# Patient Record
Sex: Female | Born: 1975 | Race: White | Hispanic: No | Marital: Married | Smoking: Never smoker
Health system: Southern US, Community
[De-identification: ages and names within clinical notes are randomized; demographics above are authoritative.]

## PROBLEM LIST (undated history)

## (undated) DIAGNOSIS — R011 Cardiac murmur, unspecified: Secondary | ICD-10-CM

## (undated) DIAGNOSIS — Z5189 Encounter for other specified aftercare: Secondary | ICD-10-CM

## (undated) DIAGNOSIS — G43909 Migraine, unspecified, not intractable, without status migrainosus: Secondary | ICD-10-CM

## (undated) DIAGNOSIS — K219 Gastro-esophageal reflux disease without esophagitis: Secondary | ICD-10-CM

## (undated) DIAGNOSIS — F32A Depression, unspecified: Secondary | ICD-10-CM

## (undated) DIAGNOSIS — F419 Anxiety disorder, unspecified: Secondary | ICD-10-CM

## (undated) DIAGNOSIS — N390 Urinary tract infection, site not specified: Secondary | ICD-10-CM

## (undated) DIAGNOSIS — F329 Major depressive disorder, single episode, unspecified: Secondary | ICD-10-CM

## (undated) DIAGNOSIS — R519 Headache, unspecified: Secondary | ICD-10-CM

## (undated) DIAGNOSIS — R51 Headache: Secondary | ICD-10-CM

## (undated) HISTORY — DX: Cardiac murmur, unspecified: R01.1

## (undated) HISTORY — DX: Depression, unspecified: F32.A

## (undated) HISTORY — DX: Migraine, unspecified, not intractable, without status migrainosus: G43.909

## (undated) HISTORY — DX: Anxiety disorder, unspecified: F41.9

## (undated) HISTORY — DX: Headache, unspecified: R51.9

## (undated) HISTORY — DX: Headache: R51

## (undated) HISTORY — DX: Urinary tract infection, site not specified: N39.0

## (undated) HISTORY — DX: Gastro-esophageal reflux disease without esophagitis: K21.9

## (undated) HISTORY — DX: Major depressive disorder, single episode, unspecified: F32.9

## (undated) HISTORY — DX: Encounter for other specified aftercare: Z51.89

---

## 1998-01-22 ENCOUNTER — Other Ambulatory Visit: Admission: RE | Admit: 1998-01-22 | Discharge: 1998-01-22 | Payer: Self-pay | Admitting: Obstetrics and Gynecology

## 1999-01-25 ENCOUNTER — Other Ambulatory Visit: Admission: RE | Admit: 1999-01-25 | Discharge: 1999-01-25 | Payer: Self-pay | Admitting: *Deleted

## 2000-03-10 ENCOUNTER — Other Ambulatory Visit: Admission: RE | Admit: 2000-03-10 | Discharge: 2000-03-10 | Payer: Self-pay | Admitting: *Deleted

## 2001-03-29 ENCOUNTER — Other Ambulatory Visit: Admission: RE | Admit: 2001-03-29 | Discharge: 2001-03-29 | Payer: Self-pay | Admitting: *Deleted

## 2002-04-12 ENCOUNTER — Other Ambulatory Visit: Admission: RE | Admit: 2002-04-12 | Discharge: 2002-04-12 | Payer: Self-pay | Admitting: *Deleted

## 2003-04-18 ENCOUNTER — Other Ambulatory Visit: Admission: RE | Admit: 2003-04-18 | Discharge: 2003-04-18 | Payer: Self-pay | Admitting: *Deleted

## 2007-06-21 HISTORY — PX: ABDOMINAL HYSTERECTOMY: SHX81

## 2014-04-10 ENCOUNTER — Ambulatory Visit: Payer: Self-pay | Admitting: Internal Medicine

## 2014-05-05 ENCOUNTER — Ambulatory Visit: Payer: Self-pay | Admitting: Internal Medicine

## 2014-06-09 ENCOUNTER — Ambulatory Visit: Payer: Self-pay | Admitting: Internal Medicine

## 2014-06-19 ENCOUNTER — Other Ambulatory Visit (INDEPENDENT_AMBULATORY_CARE_PROVIDER_SITE_OTHER): Payer: BC Managed Care – PPO

## 2014-06-19 ENCOUNTER — Ambulatory Visit (INDEPENDENT_AMBULATORY_CARE_PROVIDER_SITE_OTHER): Payer: BC Managed Care – PPO | Admitting: Internal Medicine

## 2014-06-19 ENCOUNTER — Encounter: Payer: Self-pay | Admitting: Internal Medicine

## 2014-06-19 VITALS — BP 116/76 | HR 84 | Temp 98.2°F | Resp 16 | Ht 64.0 in | Wt 189.0 lb

## 2014-06-19 DIAGNOSIS — F32A Depression, unspecified: Secondary | ICD-10-CM

## 2014-06-19 DIAGNOSIS — R635 Abnormal weight gain: Secondary | ICD-10-CM | POA: Insufficient documentation

## 2014-06-19 DIAGNOSIS — G4700 Insomnia, unspecified: Secondary | ICD-10-CM

## 2014-06-19 DIAGNOSIS — Z0000 Encounter for general adult medical examination without abnormal findings: Secondary | ICD-10-CM | POA: Insufficient documentation

## 2014-06-19 DIAGNOSIS — R799 Abnormal finding of blood chemistry, unspecified: Secondary | ICD-10-CM

## 2014-06-19 DIAGNOSIS — F329 Major depressive disorder, single episode, unspecified: Secondary | ICD-10-CM

## 2014-06-19 LAB — BASIC METABOLIC PANEL
BUN: 16 mg/dL (ref 6–23)
CO2: 27 mEq/L (ref 19–32)
CREATININE: 0.8 mg/dL (ref 0.4–1.2)
Calcium: 9.8 mg/dL (ref 8.4–10.5)
Chloride: 105 mEq/L (ref 96–112)
GFR: 84.63 mL/min (ref >60.00)
GLUCOSE: 86 mg/dL (ref 70–99)
POTASSIUM: 4.9 meq/L (ref 3.5–5.1)
Sodium: 136 mEq/L (ref 135–145)

## 2014-06-19 LAB — LIPID PANEL
CHOL/HDL RATIO: 7
CHOLESTEROL: 224 mg/dL — AB (ref 0–200)
HDL: 34.3 mg/dL — ABNORMAL LOW (ref >39.00)
NonHDL: 189.7
Triglycerides: 202 mg/dL — ABNORMAL HIGH (ref 0.0–149.0)
VLDL: 40.4 mg/dL — ABNORMAL HIGH (ref 0.0–40.0)

## 2014-06-19 LAB — TSH: TSH: 1.15 u[IU]/mL (ref 0.35–4.50)

## 2014-06-19 LAB — T4, FREE: FREE T4: 0.7 ng/dL (ref 0.60–1.60)

## 2014-06-19 MED ORDER — SUVOREXANT 5 MG PO TABS: 5.0000 mg | ORAL_TABLET | Freq: Every evening | ORAL | Status: DC | PRN

## 2014-06-19 NOTE — Assessment & Plan Note (Signed)
Doing well with citalopram and given recommendations for therapist.

## 2014-06-19 NOTE — Assessment & Plan Note (Signed)
She has problems with thoughts on her mind at night time and will trial belsomra as even tylenol PM OTC has made her feel groggy the next day and she still has young child (5).

## 2014-06-19 NOTE — Patient Instructions (Signed)
We will have you try a medicine called belsomra for sleep. It helps to turn off the awake system which is keeping you thinking about things and alert and lets the sleep system take over. We have given you a savings card in case your insurance does not cover it.  We will check on your thyroid, cholesterol, kidneys.   We will see you back next year for your physical if things are going well, if you have any problems with your medicines or sleep please feel free to call us back and come back sooner.   Exercise to Lose Weight Exercise and a healthy diet may help you lose weight. Your doctor may suggest specific exercises. EXERCISE IDEAS AND TIPS  Choose low-cost things you enjoy doing, such as walking, bicycling, or exercising to workout videos.  Take stairs instead of the elevator.  Walk during your lunch break.  Park your car further away from work or school.  Go to a gym or an exercise class.  Start with 5 to 10 minutes of exercise each day. Build up to 30 minutes of exercise 4 to 6 days a week.  Wear shoes with good support and comfortable clothes.  Stretch before and after working out.  Work out until you breathe harder and your heart beats faster.  Drink extra water when you exercise.  Do not do so much that you hurt yourself, feel dizzy, or get very short of breath. Exercises that burn about 150 calories:  Running 1  miles in 15 minutes.  Playing volleyball for 45 to 60 minutes.  Washing and waxing a car for 45 to 60 minutes.  Playing touch football for 45 minutes.  Walking 1  miles in 35 minutes.  Pushing a stroller 1  miles in 30 minutes.  Playing basketball for 30 minutes.  Raking leaves for 30 minutes.  Bicycling 5 miles in 30 minutes.  Walking 2 miles in 30 minutes.  Dancing for 30 minutes.  Shoveling snow for 15 minutes.  Swimming laps for 20 minutes.  Walking up stairs for 15 minutes.  Bicycling 4 miles in 15 minutes.  Gardening for 30 to 45  minutes.  Jumping rope for 15 minutes.  Washing windows or floors for 45 to 60 minutes. Document Released: 07/09/2010 Document Revised: 08/29/2011 Document Reviewed: 07/09/2010 [facility] Patient Information 2015 [place], [place]. This information is not intended to replace advice given to you by your health care provider. Make sure you discuss any questions you have with your health care provider. Serving Sizes What we call a serving size today is larger than it was in the past. A 1950s fast-food burger contained little more than 1 oz of meat, and a soft drink was 8 oz (1 cup). Today, a "quarter pounder" burger is at least 4 times that amount, and a 32 or 64 oz drink is not uncommon. A possible guide for eating when trying to lose weight is to eat about half as much as you normally do. Some estimates of serving sizes are:  1 Dairy serving:Individual container of yogurt (8 oz) or piece of cheese the size of your thumb (1 oz).  1 Grain serving: 1 slice of bread or  cup pasta.  1 Meat serving: The size of a deck of cards (3 oz).  1 Fruit serving: cup canned fruit or 1 medium fruit.  1 Vegetable serving:  cup of cooked or canned vegetables.  1 Fat serving:The size of 4 stacked dimes. Experts suggest spending 1 or 2  days measuring food portions you commonly eat. This will give you better practice at estimating serving sizes, and will also show whether you are eating an appropriate amount of food to meet your weight goals. If you find that you are eating more than you thought, try measuring your food for a few days so you can "reprogram" yourself to learn what makes a healthy portion for you. SUGGESTIONS FOR CONTROL  In restaurants, share entrees, or ask the waiter to put half the entre in a box or bag before you even touch it.  Order lunch-sized portions. Many restaurants serve 4 to 6 oz of meat at lunch, compared with 8 to 10 oz at dinner.  Split dessert or skip it all together. Have a  piece of fruit when you get home.  At home, use smaller plates and bowls. It will look as if you are eating more.  Plate your food in the kitchen rather than serving it "family style" at the table.  Wait 20 to 30 minutes before taking seconds. This is how long it takes your brain to recognize that you are full.  Check food labels for serving sizes. Eat 1 serving only.  Use measuring cups and spoons to see proper serving sizes.  Buy smaller packages of candy, popcorn, and snacks.  Avoid eating directly out of the bag or carton.  While eating half as much, exercise twice as much. Park further away from the mall, take the stairs instead of the escalator, and walk around your block. Losing weight is a slow, difficult process. It takes long-lasting lifestyle changes. You can make gradual changes over time so they become habits. Look to friends and family to support the healthy changes you are making. Avoid fad diets since they are often only temporary weight loss solutions. Document Released: 03/05/2003 Document Revised: 08/29/2011 Document Reviewed: 09/03/2013 [facility] Patient Information 2015 [place], [place]. This information is not intended to replace advice given to you by your health care provider. Make sure you discuss any questions you have with your health care provider.

## 2014-06-19 NOTE — Progress Notes (Signed)
Pre visit review using our clinic review tool, if applicable. No additional management support is needed unless otherwise documented below in the visit note. 

## 2014-06-19 NOTE — Progress Notes (Signed)
   Subjective:    Patient ID: Yolanda Wolfe, female    DOB: Nov 17, 1975, 38 y.o.   MRN: [id number]  HPI The patient is a 38 YO female who comes in today for evaluation. She is having weight gain and wants to lose some weight. She had an emergent hysterectomy after the birth of her first child due to massive bleeding. She also required bilateral fasciotomy on his shins for increased pressure and has dysfunction tendons on both great toes. She also has thyroid history in her family. No current chest pains, SOB, abdominal pains, gerd. She is taking citalopram which she thinks is doing very well. She would like to know if I can recommend a good therapist in the area as she was doing some counseling previously. She recently moved from [place].   Review of Systems  Constitutional: Positive for unexpected weight change. Negative for fever, activity change, appetite change and fatigue.  HENT: Negative.   Respiratory: Negative for cough, chest tightness, shortness of breath and wheezing.   Cardiovascular: Negative for chest pain, palpitations and leg swelling.  Gastrointestinal: Negative for abdominal pain, diarrhea, constipation and abdominal distention.  Genitourinary: Negative.   Musculoskeletal: Negative for back pain, arthralgias and gait problem.  Skin: Negative.   Neurological: Negative for dizziness, weakness, light-headedness, numbness and headaches.  Psychiatric/Behavioral: Negative.       Objective:   Physical Exam  Constitutional: She is oriented to person, place, and time. She appears well-developed and well-nourished.  HENT:  Head: Normocephalic and atraumatic.  Eyes: EOM are normal.  Neck: Normal range of motion. No thyromegaly present.  Cardiovascular: Normal rate and regular rhythm.   Pulmonary/Chest: Effort normal and breath sounds normal. No respiratory distress. She has no wheezes. She has no rales.  Abdominal: Soft. Bowel sounds are normal. She exhibits no distension. There is no  tenderness. There is no rebound.  Musculoskeletal: She exhibits no edema.  Neurological: She is alert and oriented to person, place, and time. Coordination normal.  Skin: Skin is warm and dry.  Psychiatric: She has a normal mood and affect. Her behavior is normal.   Filed Vitals:   06/19/14 1119  BP: 116/76  Pulse: 84  Temp: 98.2 F (36.8 C)  TempSrc: Oral  Resp: 16  Height: 5\' 4"  (1.626 m)  Weight: 189 lb (85.73 kg)  SpO2: 96%      Assessment & Plan:

## 2014-06-19 NOTE — Assessment & Plan Note (Signed)
Given her traumatic hysterectomy with blood loss is reasonable to check for thyroid dysfunction with TSH and free t4. Will also check lipid, cmp,

## 2014-06-19 NOTE — Assessment & Plan Note (Signed)
PAP prior to hysterectomy normal, flu shot already this season and up to date on tetanus. Had mammogram normal this year.

## 2014-06-23 LAB — LDL CHOLESTEROL, DIRECT: LDL DIRECT: 154.8 mg/dL

## 2014-06-25 ENCOUNTER — Encounter: Payer: Self-pay | Admitting: Internal Medicine

## 2014-07-08 ENCOUNTER — Ambulatory Visit (INDEPENDENT_AMBULATORY_CARE_PROVIDER_SITE_OTHER): Payer: BC Managed Care – PPO | Admitting: Licensed Clinical Social Worker

## 2014-07-08 DIAGNOSIS — F4323 Adjustment disorder with mixed anxiety and depressed mood: Secondary | ICD-10-CM

## 2014-07-09 ENCOUNTER — Ambulatory Visit: Payer: BC Managed Care – PPO | Admitting: Licensed Clinical Social Worker

## 2014-07-17 ENCOUNTER — Ambulatory Visit: Payer: BC Managed Care – PPO | Admitting: Licensed Clinical Social Worker

## 2014-09-26 ENCOUNTER — Ambulatory Visit (INDEPENDENT_AMBULATORY_CARE_PROVIDER_SITE_OTHER): Payer: BC Managed Care – PPO | Admitting: Psychiatry

## 2014-09-26 ENCOUNTER — Encounter (INDEPENDENT_AMBULATORY_CARE_PROVIDER_SITE_OTHER): Payer: Self-pay

## 2014-09-26 DIAGNOSIS — F4323 Adjustment disorder with mixed anxiety and depressed mood: Secondary | ICD-10-CM

## 2014-09-26 DIAGNOSIS — F4321 Adjustment disorder with depressed mood: Secondary | ICD-10-CM

## 2014-09-26 NOTE — Progress Notes (Signed)
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 They've had ongoing marital issue for probably close to a year. Both of them believe separation is inevitable and divorce is coming. They do have a 3-year-old daughter who seems to be handling things fairly well. The patient has a past psychiatric history where in 2009 after hysterectomy she developed depression and was treated with Celexa and therapy. She did well but continued on the Celexa. Again 4 years ago in 2012 she again had a period of depression. During both of these episodes where she was depressed she actually did not have vegetative symptoms of significance and technically did not really have a clear episode of major depression. Even now she describes daily depression but has chronic unchanging sleep normal appetite and normal energy. Her concentration is good she is a good sense of worth she is not suicidal now nor she ever been. She enjoys life to a great degree reading watching television and caring for her animals. 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 The patient presently is in graduate school . This patient has no history of being in a psychiatric hospital. She has no other medication she's been on other than her Celexa. The patient denies the use of alcohol or drugs. She's never been psychotic. She denies symptoms of mania. She denies any specific symptoms typical of an anxiety disorder. Even at this time she does not feel all that anxious. In essence the patient believes that the psychotherapy in the past has been helpful and now her lyses she needs therapy again. History of Chief Complaint:  No chief complaint on  file.   HPI Review of Systems Physical Exam  Depressive Symptoms: depressed mood,  (Hypo) Manic Symptoms:   Elevated Mood:  No Irritable Mood:  No Grandiosity:  No Distractibility:  No Labiality of Mood:  No Delusions:  No Hallucinations:  No Impulsivity:  No Sexually Inappropriate Behavior:  No Financial Extravagance:  No Flight of Ideas:  No  Anxiety Symptoms: Excessive Worry:  No Panic Symptoms:  No Agoraphobia:  No Obsessive Compulsive: No  Symptoms:  Specific Phobias:   Social Anxiety:  No  Psychotic Symptoms:  Hallucinations: No  Delusions:  No  Paranoia:    none Ideas of Reference:    PTSD Symptoms: Ever had a traumatic exposure:   Had a traumatic exposure in the last month:   Re-experiencing:   Hypervigilance:   Hyperarousal:   Avoidance:  None  Traumatic Brain Injury: No   Past Psychiatric History: Diagnosis:Major depression  Hospitalizations: o  Outpatient Care: therapy  Substance Abuse Care: no  Self-Mutilation:   Suicidal Attempts:   Violent Behaviors:   Past Medical History:   Past Medical History  Diagnosis Date  . Depression   . Frequent headaches   . GERD (gastroesophageal reflux disease)   . Heart murmur   . Blood transfusion without reported diagnosis   . Migraine   . UTI (lower urinary tract infection)    History of Loss of Consciousness:   Seizure History:   Cardiac History:   Allergies:  No Known Allergies Current Medications:  Current Outpatient Prescriptions  Medication Sig Dispense Refill  . citalopram (CELEXA) 40 MG  tablet Take 40 mg by mouth daily.     . Suvorexant (BELSOMRA) 5 MG TABS Take 5 mg by mouth at bedtime as needed. 30 tablet 3   No current facility-administered medications for this visit.    Previous Psychotropic Medications:  Medication Dose   Celexa 40 mg                          Medical Consequences of Substance Abuse:   Legal Consequences of Substance Abuse:   Family Consequences  of Substance Abuse:   Blackouts:   DT's:   Withdrawal Symptoms:     Social History: Current Place of Residence: [place], sport and exercise of Birth:  Family Members:  Marital Status:  Separated Children: 1  Sons:   Daughters:  Relationships:  Education:  Dentist Problems/Performance:  Religious Beliefs/Practices:  History of Abuse:  Ship broker History:   Legal History:  Hobbies/Interests:   Family History:   Family History  Problem Relation Age of Onset  . Cancer Maternal Grandmother     breast  . Heart disease Maternal Grandfather     Mental Status Examination/Evaluation: Objective:  Appearance: Fairly Groomed  Engineer, water::  Good  Speech:  Clear and Coherent  Volume:  Normal  Mood:  good  Affect:  Congruent  Thought Process:  Coherent  Orientation:  Full (Time, Place, and Person)  Thought Content:  WDL  Suicidal Thoughts:  No  Homicidal Thoughts:  No  Judgement:  NA  Insight:  Good  Psychomotor Activity:  Normal  Akathisia:  No  Handed:  Right  AIMS (if indicated):    Assets:  Desire for Improvement    Laboratory/X-Ray Psychological Evaluation(s)        Assessment:  Axis I: Adjustment Disorder with Depressed Mood  AXIS I Adjustment Disorder with Depressed Mood  AXIS II Deferred  AXIS III Past Medical History  Diagnosis Date  . Depression   . Frequent headaches   . GERD (gastroesophageal reflux disease)   . Heart murmur   . Blood transfusion without reported diagnosis   . Migraine   . UTI (lower urinary tract infection)      AXIS IV problems with primary support group  AXIS V    Treatment Plan/Recommendations:  Plan of Care: This patient will continue taking Celexa 40 mg. Her most important intervention however will be to refer her to therapist. Burnis Medin refer her to Katina Degree and Dr. Sharion Settler. The patient was also given a referral to Mr. Lowry Bowl as a potential divorce lawyer. His patient to return to see me  in   Laboratory:    Psychotherapy:   Medications:   Routine PRN Medications:    Consultations:   Safety Concerns:    Other:      Haskel Schroeder, MD 4/8/20169:15 AM

## 2014-12-31 ENCOUNTER — Encounter (HOSPITAL_COMMUNITY): Payer: Self-pay | Admitting: Psychiatry

## 2014-12-31 ENCOUNTER — Ambulatory Visit (INDEPENDENT_AMBULATORY_CARE_PROVIDER_SITE_OTHER): Payer: Self-pay | Admitting: Psychiatry

## 2014-12-31 VITALS — BP 106/76 | HR 78 | Ht 64.0 in | Wt 184.2 lb

## 2014-12-31 DIAGNOSIS — F4323 Adjustment disorder with mixed anxiety and depressed mood: Secondary | ICD-10-CM

## 2014-12-31 DIAGNOSIS — F330 Major depressive disorder, recurrent, mild: Secondary | ICD-10-CM

## 2014-12-31 MED ORDER — SERTRALINE HCL 100 MG PO TABS: 100.0000 mg | ORAL_TABLET | Freq: Every day | ORAL | Status: DC

## 2014-12-31 NOTE — Progress Notes (Signed)
[facility] MD Progress Note  12/31/2014 2:22 PM Yolanda Wolfe  MRN:  [id number] Subjective: Better Principal Problem: Adjustment disorder with depressed mood Diagnosis:  Major depression mild recurrent Patient Active Problem List   Diagnosis Date Noted  . Weight gain [R63.5] 06/19/2014  . Depression [F32.9] 06/19/2014  . Routine general medical examination at a health care facility [Z00.00] 06/19/2014  . Insomnia [G47.00] 06/19/2014   Total Time spent with patient: 30 minutes   Past Medical History:  Past Medical History  Diagnosis Date  . Depression   . Frequent headaches   . GERD (gastroesophageal reflux disease)   . Heart murmur   . Blood transfusion without reported diagnosis   . Migraine   . UTI (lower urinary tract infection)     Past Surgical History  Procedure Laterality Date  . Abdominal hysterectomy  2009   Family History:  Family History  Problem Relation Age of Onset  . Cancer Maternal Grandmother     breast  . Heart disease Maternal Grandfather   . Alcohol abuse Paternal Grandfather   . Dementia Paternal Grandmother    Social History:  History  Alcohol Use  . 0.6 oz/week  . 0 Standard drinks or equivalent, 1 Glasses of wine per week     History  Drug Use No    History   Social History  . Marital Status: Married    Spouse Name: N/A  . Number of Children: N/A  . Years of Education: N/A   Social History Main Topics  . Smoking status: Never Smoker   . Smokeless tobacco: Never Used  . Alcohol Use: 0.6 oz/week    0 Standard drinks or equivalent, 1 Glasses of wine per week  . Drug Use: No  . Sexual Activity: Yes    Birth Control/ Protection: Surgical   Other Topics Concern  . None   Social History Narrative   Additional History:    Sleep: Poor  Appetite:  Good   Assessment: Today the patient says that overall she is improved. When we first met she was having persistent daily depression and now she is only depressed about 40-50% of the week.  When she does feel depressed it is very intense although she's never suicidal. Overall that her mood has improved even though we did not change her Celexa. Will he did do his we got her to see a good therapist that she seeing now. She's going onto her fifth visit and overall it likely is helpful. She has minimal contact with her husband who she separated from. The patient continues to start making new relationships with people and this feels good to her. Overall she continues asleep poorly but this been the case for much of her life. Her appetite good she has good energy and she can concentrate without problems. The other stressor that did occur is that her contract was not renewed. Therefore she is unemployed and looking for work at this time. 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 The patient's daughter is doing very well. The patient continues to enjoy riding horses spending time with her dogs or cats and listening to music. The patient denies the use of alcohol or drugs. The patient denies chest discomfort shortness of breath or any neurological symptoms at this time. Medically she is very stable. Today reviewed the pros and cons of her medication and she agreed to take them as prescribed.  Musculoskeletal: Strength & Muscle Tone: within normal limits Gait & Station:  normal Patient leans: Right   Psychiatric Specialty Exam: Physical Exam  ROS  Blood pressure 106/76, pulse 78, height _0  (1.626 m), weight 184 lb 3.2 oz (83.553 kg).Body mass index is 31.6 kg/(m^2).  General Appearance: Casual  Eye Contact::  Good  Speech:  Clear and Coherent  Volume:  Normal  Mood:  Euthymic  Affect:  Appropriate  Thought Process:  Coherent  Orientation:  NA  Thought Content:  WDL  Suicidal Thoughts:  No  Homicidal Thoughts:  No  Memory:  NA  Judgement:  Good  Insight:  Good  Psychomotor Activity:  Normal  Concentration:  Good  Recall:  Good  Fund of Knowledge:Good   Language: Good  Akathisia:  No  Handed:  Right  AIMS (if indicated):     Assets:  Communication Skills  ADL's:  Intact  Cognition: WNL  Sleep:        Current Medications: Current Outpatient Prescriptions  Medication Sig Dispense Refill  . citalopram (CELEXA) 40 MG tablet Take 40 mg by mouth daily.     . sertraline (ZOLOFT) 100 MG tablet Take 1 tablet (100 mg total) by mouth daily. 30 tablet 5  . Suvorexant (BELSOMRA) 5 MG TABS Take 5 mg by mouth at bedtime as needed. (Patient not taking: Reported on 12/31/2014) 30 tablet 3   No current facility-administered medications for this visit.    Lab Results: No results found for this or any previous visit (from the past 48 hour(s)).  Physical Findings: AIMS:  , ,  ,  ,    CIWA:    COWS:     Treatment Plan Summary: At this time we shall actually change her Celexa to Zoloft. I shared with her that she can just switch over stopping the Celexa and starting with 50 mg of Zoloft for a week and then increasing the Zoloft 100 mg. The patient feels like she once to do something different with her antidepressive. She's been on it for long time. The possibility of raising Zoloft is possible in contrast to Celexa which is at its maximum dose. The other issue besides depression is her second problem of chronic poor sleep. At this time her therapist would like to work on some things to help her to do some behavioral approaches. Today our approach for sleep was to give her sleep hygiene exercises and recommendations. So first problem is that of depression which is clearly improved with the use of therapy and time. Her second problem is sleep are now she has some instructions of improved sleep hygiene and ultimately we will consider the use of some sleeping aids. I think a good night's sleep is important for depression. The patient has tried a number of the over-the-counter agents which has not been helpful. This patient to return to see me in 3  months.   Medical Decision Making:  Self-Limited or Minor (1)     Aneya Daddona IRVING 12/31/2014, 2:22 PM

## 2015-04-03 ENCOUNTER — Ambulatory Visit (HOSPITAL_COMMUNITY): Payer: Self-pay | Admitting: Psychiatry

## 2015-04-03 ENCOUNTER — Ambulatory Visit (INDEPENDENT_AMBULATORY_CARE_PROVIDER_SITE_OTHER): Payer: Self-pay | Admitting: Psychiatry

## 2015-04-03 VITALS — BP 122/82 | HR 75 | Ht 64.0 in | Wt 187.0 lb

## 2015-04-03 DIAGNOSIS — F331 Major depressive disorder, recurrent, moderate: Secondary | ICD-10-CM

## 2015-04-03 MED ORDER — SERTRALINE HCL 100 MG PO TABS: 100.0000 mg | ORAL_TABLET | Freq: Every day | ORAL | Status: DC

## 2015-04-03 NOTE — Progress Notes (Signed)
[facility] MD Progress Note  04/03/2015 9:17 AM Yolanda Wolfe  MRN:  [id number] Subjective:  Feeling good Principal Problem: Major depression recurrent, remission Diagnosis:  Major depression recurrent, recurrent Today the patient says she feels just about back to herself. She denies daily depression. Since I've seen her she is now started in a new relationship that seems better than her previous boyfriend. She continues to look for work. Patient is getting a PhD in education and is looking for a job working at [employer] setting. She has applications out to a variety of places. Her daughter who 30 years old is doing great. She is active and energetic intelligent and a good kid. The patient has a reasonably good relationship also with her mother. Today the patient denies daily depression. She sleeping and eating well. She has improved sleep hygiene. She's good energy. The patient enjoys walking her dog reading and watching TV. Unfortunately she did not have a good chemistry with the therapist that she was sent to. She is interested in therapy and she's agreed that she liked to have somebody to start with. The patient denies the use of alcohol or drugs. She denies being psychotic. The patient is functioning very well. She denies any physical complaints at this time. Patient Active Problem List   Diagnosis Date Noted  . Weight gain [R63.5] 06/19/2014  . Depression [F32.9] 06/19/2014  . Routine general medical examination at a health care facility [Z00.00] 06/19/2014  . Insomnia [G47.00] 06/19/2014   Total Time spent with patient: 30 minutes  Past Psychiatric History:   Past Medical History:  Past Medical History  Diagnosis Date  . Depression   . Frequent headaches   . GERD (gastroesophageal reflux disease)   . Heart murmur   . Blood transfusion without reported diagnosis   . Migraine   . UTI (lower urinary tract infection)     Past Surgical History  Procedure Laterality Date  . Abdominal  hysterectomy  2009   Family History:  Family History  Problem Relation Age of Onset  . Cancer Maternal Grandmother     breast  . Heart disease Maternal Grandfather   . Alcohol abuse Paternal Grandfather   . Dementia Paternal Grandmother    Family Psychiatric  History:  Social History:  History  Alcohol Use  . 0.6 oz/week  . 0 Standard drinks or equivalent, 1 Glasses of wine per week     History  Drug Use No    Social History   Social History  . Marital Status: Married    Spouse Name: N/A  . Number of Children: N/A  . Years of Education: N/A   Social History Main Topics  . Smoking status: Never Smoker   . Smokeless tobacco: Never Used  . Alcohol Use: 0.6 oz/week    0 Standard drinks or equivalent, 1 Glasses of wine per week  . Drug Use: No  . Sexual Activity: Yes    Birth Control/ Protection: Surgical   Other Topics Concern  . Not on file   Social History Narrative   Additional Social History:                         Sleep: Good  Appetite:  Good  Current Medications: Current Outpatient Prescriptions  Medication Sig Dispense Refill  . citalopram (CELEXA) 40 MG tablet Take 40 mg by mouth daily.     . sertraline (ZOLOFT) 100 MG tablet Take 1 tablet (100 mg total) by mouth  daily. 30 tablet 5  . Suvorexant (BELSOMRA) 5 MG TABS Take 5 mg by mouth at bedtime as needed. (Patient not taking: Reported on 12/31/2014) 30 tablet 3   No current facility-administered medications for this visit.    Lab Results: No results found for this or any previous visit (from the past 48 hour(s)).  Physical Findings: AIMS:  , ,  ,  ,    CIWA:    COWS:     Musculoskeletal: Strength & Muscle Tone: within normal limits Gait & Station: normal Patient leans: Right  Psychiatric Specialty Exam: ROS  Blood pressure 122/82, pulse 75, height 5\' 4"  (1.626 m), weight 187 lb (84.823 kg).Body mass index is 32.08 kg/(m^2).  General Appearance: Casual  Eye Contact::  Good   Speech:  Clear and Coherent  Volume:  Normal  Mood:  Euthymic  Affect:  NA  Thought Process:  Coherent  Orientation:  Full (Time, Place, and Person)  Thought Content:  WDL  Suicidal Thoughts:  No  Homicidal Thoughts:  No  Memory:  NA  Judgement:  Good  Insight:  Good  Psychomotor Activity:  Normal  Concentration:  Good  Recall:  Good  Fund of Knowledge:Good  Language: Good  Akathisia:  No  Handed:  Right  AIMS (if indicated):     Assets:  Desire for Improvement  ADL's:  Intact  Cognition: WNL  Sleep:      Treatment Plan Summary: At this time the patient is doing very well. Her mood is back to normal. The patient is sleeping and eating well. She has no vegetative symptoms. He is actively engaged looking for a job and presently is in a good relationship. The patient is interested in supportive psychotherapy. The patient is articulate and psychologically sophisticated. At this time she is not suicidal. She is very stable. She is functioning well. She changed from Celexa to  100 mg of Zoloft without any side effects. Her sex drive is good and she has no GI problems. At this time we'll go ahead and set her up for a therapist in our center and she'll get a return appointment to see me in 5 months. The patient was instructed that if anything changes to call his right away but I believe this patient is very stable at this time. Remeron problem therefore is clinical depression and she's treated with a better antidepressant that of Zoloft together with starting psychotherapy. Her second problem is problems with sleep with the use of sleep hygiene she seems to be doing well.  Yolanda Wolfe, [place] 04/03/2015, 9:17 AM

## 2015-05-05 ENCOUNTER — Ambulatory Visit (HOSPITAL_COMMUNITY): Payer: Self-pay | Admitting: Psychology

## 2015-05-07 ENCOUNTER — Ambulatory Visit (INDEPENDENT_AMBULATORY_CARE_PROVIDER_SITE_OTHER): Payer: BC Managed Care – PPO | Admitting: Psychology

## 2015-05-07 ENCOUNTER — Encounter (HOSPITAL_COMMUNITY): Payer: Self-pay | Admitting: Psychology

## 2015-05-07 DIAGNOSIS — F331 Major depressive disorder, recurrent, moderate: Secondary | ICD-10-CM

## 2015-05-07 DIAGNOSIS — F339 Major depressive disorder, recurrent, unspecified: Secondary | ICD-10-CM | POA: Insufficient documentation

## 2015-05-08 NOTE — Progress Notes (Signed)
Comprehensive Clinical Assessment (CCA) Note  05/08/2015 Yolanda Wolfe [id number]  CCA Part One  Part One has been completed on paper by the patient.  (See scanned document in Chart Review)  CCA Part Two A  Intake/Chief Complaint:  CCA Intake With Chief Complaint CCA Part Two Date: 05/07/15 CCA Part Two Time: 1007 Chief Complaint/Presenting Problem: Pt presents for counseling for ongoing depression. Pt has been tx by Dr. Casimiro Wolfe for MDD since April 2016.  Pt first experienced depression in 2009 following the birth of her daughter.  Pt had a normal pregnancy, difficult deliver, unable to deliver vaginally after pushing couple hours, had a csection, then severe bleeding that wouldn't stop and an emergency hysterectomy.   Pt also had emergency surger on her legs due to another complication.  Pt was in the hospital 2 weeks after delivery , then moved in with her parents several months so they could assist her w/ care of her daugher and her recovery.  pt had another episode of severe depression / "super dark place" 3 years ago feeling need to get out of her marriage.  Pt had counselor she connected w/ when living in [place] and has attemped couple counselors in the area but hasn't found a good fit.  (Pt presents for counseling for ongoing depression. Pt has been tx by Dr. Casimiro Wolfe for MDD since April 2016.   Patients Currently Reported Symptoms/Problems: pt reports she still struggles w/ feeling of worthlessness and bad about self- "should have a job by now".  Pt reports she falls asleep well, but wakes throughout the night/restless sleep, difficulty getting up on days doesn't have daughter and feels fatigued throughout the day.  pt reports she doesn't feel  satisfied and feels like she is "waiting".  Pt reports she is a chronic procrastinator which does cause stress.  pt reports little motivation towards accomplishing things on a daily basis.  pt reports she has started horse back riding again and this has  been positive.  pt does report worry about future, job, completing dissertation (pt reports she still struggles w/ feeling of worthlessness and bad about self- "should have a job by now".  Pt reports she falls asleep well, but wakes throughout the night/restless sleep, difficulty getting up on days doesn't have daughter and feels fat) Collateral Involvement: Dr. Karen Wolfe notes (Dr. Karen Wolfe notes) Individual's Strengths: Support of parents, brother, friends, Yolanda Wolfe/exhusband, and her dog minnie.  She is smart a good Ship broker, funny and fun to be with, loving and a Media planner.  she enjoys horseback riding, traveling and is good w/ animals.  (Support of parents, brother, friends, Yolanda Wolfe/exhusband, and her dog minnie.  She is smart a good Ship broker, funny and fun to be with, loving and a Media planner.  she enjoys horseback riding, traveling and is good w/ animals. ) Individual's Preferences: counseling to assist w/ keeping accountable and plan for strategies for self care. (counseling to assist w/ keeping accountable and plan for strategies for self care.) Type of Services Patient Feels Are Needed: counseling (counseling)  Mental Health Symptoms Depression:  Depression: Fatigue, Sleep (too much or little), Worthlessness, Change in energy/activity  Mania:  Mania: N/A  Anxiety:   Anxiety: Fatigue, Sleep, Worrying, Irritability  Psychosis:  Psychosis: N/A  Trauma:  Trauma: N/A  Obsessions:  Obsessions: N/A  Compulsions:  Compulsions: N/A  Inattention:  Inattention: N/A  Hyperactivity/Impulsivity:  Hyperactivity/Impulsivity: N/A  Oppositional/Defiant Behaviors:  Oppositional/Defiant Behaviors: N/A  Borderline Personality:  Emotional Irregularity: N/A  Other Mood/Personality Symptoms:  Mental Status Exam Appearance and self-care  Stature:  Stature: Average  Weight:  Weight: Average weight  Clothing:  Clothing: Neat/clean  Grooming:  Grooming: Well-groomed  Cosmetic use:  Cosmetic Use: Age  appropriate  Posture/gait:  Posture/Gait: Normal  Motor activity:  Motor Activity: Not Remarkable  Sensorium  Attention:  Attention: Normal  Concentration:  Concentration: Normal  Orientation:  Orientation: X5  Recall/memory:  Recall/Memory: Normal  Affect and Mood  Affect:  Affect: Appropriate  Mood:  Mood: Depressed  Relating  Eye contact:  Eye Contact: Normal  Facial expression:  Facial Expression: Responsive  Attitude toward examiner:  Attitude Toward Examiner: Cooperative  Thought and Language  Speech flow: Speech Flow: Normal  Thought content:  Thought Content: Appropriate to mood and circumstances  Preoccupation:     Hallucinations:     Organization:     Transport planner of Knowledge:  Fund of Knowledge: Average  Intelligence:  Intelligence: Above Average  Abstraction:  Abstraction: Abstract  Judgement:  Judgement: Normal  Reality Testing:  Reality Testing: Adequate  Insight:  Insight: Good  Decision Making:  Decision Making: Normal  Social Functioning  Social Maturity:  Social Maturity: Responsible  Social Judgement:  Social Judgement: Normal  Stress  Stressors:  Stressors: Transitions, Chiropodist (Working on her dissertation, procrastination, seeking employment.)  Coping Ability:  Coping Ability: Normal (pt good insight about needs for self care, but doesn't always participate in this self care)  Skill Deficits:     Supports:      Family and Psychosocial History: Family history Marital status: Separated (divorce will be final in a couple months) Separated, when?: 1.5 years ago Additional relationship information: Pt reports that her and exhusband get along well, live close by to each other and coparent well together.  (Pt reports that her and exhusband get along well, live close by to each other and coparent well together. ) Does patient have children?: Yes How many children?: 1 How is patient's relationship with their children?: Pt reports that her  relationship w/ her daughter Yolanda Wolfe is great.  No concerns.  This a a positive for her.  (Pt reports that her relationship w/ her daughter Yolanda Wolfe is great.  No concerns.  This a a positive for her. )  Childhood History:  Childhood History By whom was/is the patient raised?: Both parents Additional childhood history information: Pt has a younger brother.   Description of patient's relationship with caregiver when they were a child: pt reports her family has always been very close.  Patient's description of current relationship with people who raised him/her: pt reports parents very supportive.  They live together as planned.  (pt reports parents very supportive.  They live together as planned. ) Does patient have siblings?: Yes Number of Siblings: 1 Description of patient's current relationship with siblings: Pt's brother lives close by and they see each other often.  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  Where was the patient employed at that time?: Pt worked severa years for [employer] and really enjoyed her job.  Has patient ever been in the [employer]?: No Are There Guns or Other Weapons in [place]?: No  Leisure/Recreation: Leisure / Recreation Leisure and Hobbies: Pt enjoys  horseback riding, is good w/ animals and enjoys traveling.   Exercise/Diet: Exercise/Diet Do You Exercise?: No Have You Gained or Lost A Significant Amount of Weight in the Past Six Months?: No Do You Follow a Special Diet?: No Do You Have Any Trouble Sleeping?: Yes Explanation of Sleeping Difficulties: pt is able to fall asleep well, but wakes throughout the night/ feels restless sleep.  Difficulty getting up when doesn't have daughter to get ready in the morning.   Alcohol/Drug Use: Alcohol / Drug Use Prescriptions: pt takes her medication as prescribed.  History of alcohol / drug use?: No history of alcohol / drug abuse                      CCA Part Three  ASAM's:  Six Dimensions of Multidimensional Assessment  Dimension 1:  Acute Intoxication and/or Withdrawal Potential:     Dimension 2:  Biomedical Conditions and Complications:     Dimension 3:  Emotional, Behavioral, or Cognitive Conditions and Complications:     Dimension 4:  Readiness to Change:     Dimension 5:  Relapse, Continued use, or Continued Problem Potential:     Dimension 6:  Recovery/Living Environment:      Substance use Disorder (SUD)    Social Function:  Social Functioning Social Maturity: Responsible Social Judgement: Normal  Stress:  Stress Stressors: Transitions, Chiropodist (Working on her dissertation, procrastination, seeking employment.) Coping Ability: Normal (pt good insight about needs for self care, but doesn't always participate in this self care) Patient Takes Medications The Way The Doctor Instructed?: Yes  Risk Assessment- Self-Harm Potential: Risk Assessment For Dangerous to Others Potential Method: No Plan Availability of Means: No access or NA  Risk Assessment -Dangerous to Others Potential: Risk Assessment For Dangerous to Others Potential Method: No Plan Availability of Means: No access or NA  DSM5 Diagnoses: Patient Active Problem List   Diagnosis Date Noted  . Major  depressive disorder, recurrent episode ([place]) 05/07/2015  . Weight gain 06/19/2014  . Depression 06/19/2014  . Routine general medical examination at a health care facility 06/19/2014  . Insomnia 06/19/2014      Recommendations for Services/Supports/Treatments: Recommendations for Services/Supports/Treatments Recommendations For Services/Supports/Treatments: Individual Therapy, Medication Management  Treatment Plan Summary:  See copy of tx plan filed.     [name]

## 2015-05-13 ENCOUNTER — Ambulatory Visit (HOSPITAL_COMMUNITY): Payer: Self-pay | Admitting: Psychology

## 2015-06-08 ENCOUNTER — Other Ambulatory Visit: Payer: Self-pay | Admitting: Obstetrics and Gynecology

## 2015-06-08 DIAGNOSIS — Z803 Family history of malignant neoplasm of breast: Secondary | ICD-10-CM

## 2015-06-24 ENCOUNTER — Ambulatory Visit (INDEPENDENT_AMBULATORY_CARE_PROVIDER_SITE_OTHER): Payer: BC Managed Care – PPO | Admitting: Psychology

## 2015-06-24 DIAGNOSIS — F331 Major depressive disorder, recurrent, moderate: Secondary | ICD-10-CM

## 2015-06-24 NOTE — Progress Notes (Signed)
   THERAPIST PROGRESS NOTE  Session Time: 1.30pm-2.23pm  Participation Level: Active  Behavioral Response: Well GroomedAlertDepressed  Type of Therapy: Individual Therapy  Treatment Goals addressed: Diagnosis: MDD and goal 1.  Interventions: CBT and Supportive  Summary: Yolanda Wolfe is a 40 y.o. female who presents with full and bright affect.  Pt reported that mood has been good past couple of days- prior to that 3 days of depressed mood and depressed Christmas day after time w/ her daughter.  Pt reported that she struggles with maintaining good mood, motivation, restless sleep, feeling tired throughout the day.  Pt did identify that was helpful for her to talk w/ mom about her mood and recognizing that usually she avoids this to not "burden" parents- reframed that not communicating actually burdens more.  Pt identified also that approach of taking day by day and making small goals she can check off is helpful.  Pt identified setting alarm to wake each morning, horseback riding and talking to mom daily as goals for next couple of days.  Pt did discuss tension feels that not helping around house because mom is compulsive about cleaning and extremely organized.  Pt able to externalize and acknowledge that talking w/ mom about wanting to contribute and what/how to would be good start.     Suicidal/Homicidal: Nowithout intent/plan  Therapist Response: Assessed pt current functioning per pt report.  Processed w/pt depressed mood, low motivation and loss of interest and how to set daily small goals that can accomplish and set pattern of success.  Had pt identify these goals.  Discussed w/ pt how can communicate w/ mom re: tension and focus on how she can contribute.   Plan: Return again in 2 weeks.  Diagnosis: MDD    [name], [facility] 06/24/2015

## 2015-07-01 ENCOUNTER — Other Ambulatory Visit: Payer: Self-pay

## 2015-07-03 ENCOUNTER — Ambulatory Visit
Admission: RE | Admit: 2015-07-03 | Discharge: 2015-07-03 | Disposition: A | Discharge status: Home / Self Care | Payer: BC Managed Care – PPO | Source: Ambulatory Visit | Attending: Obstetrics and Gynecology | Admitting: Obstetrics and Gynecology

## 2015-07-03 DIAGNOSIS — Z803 Family history of malignant neoplasm of breast: Secondary | ICD-10-CM

## 2015-07-03 MED ORDER — GADOBENATE DIMEGLUMINE 529 MG/ML IV SOLN
15.0000 mL | Freq: Once | INTRAVENOUS | Status: AC | PRN
  Administered 2015-07-03: 15 mL via INTRAVENOUS

## 2015-07-22 ENCOUNTER — Ambulatory Visit (INDEPENDENT_AMBULATORY_CARE_PROVIDER_SITE_OTHER): Payer: BLUE CROSS/BLUE SHIELD | Admitting: Psychology

## 2015-07-22 DIAGNOSIS — F330 Major depressive disorder, recurrent, mild: Secondary | ICD-10-CM | POA: Diagnosis not present

## 2015-07-22 NOTE — Progress Notes (Signed)
   THERAPIST PROGRESS NOTE  Session Time: 10.05am-10.48am  Participation Level: Active  Behavioral Response: Well GroomedAlertAFFECT WNL  Type of Therapy: Individual Therapy  Treatment Goals addressed: Diagnosis: MDD and goal 1  Interventions: CBT and Solution Focused  Summary: Yolanda Wolfe is a 40 y.o. female who presents with full and bright affect.  Pt reported that overall she is doing well w/ her mood- pt reports only a couple days of depressed mood since last visit and "able to come out of pretty quick".  Pt reported that she has maintained making small daily or weekly goals and is accomplishing them.  Pt reported that she is also not napping, working out and talking w/ her mom and this is helpful.  Pt reported she had her proposal defense did have some anxiety about- but was able to reframe distortions and cope through this. Pt reported the defense went really well - feels motivated and is setting small goals for self next week to keep this momentum.  Pt identified that being on campus gives good energy so is planning to do this weekly.  Pt reported that major compliant is feeling lack of energy, restless sleep and waking tired in morning.  Pt reports she is attending sleep workshop next week and acknowledges she needs to be more consistent about following her bedtime routine w/out screen time.  Pt questions whether should have a sleep study and will discuss w/ physicians.  Pt reported dating someone new and very excited- pt acknowledges that getting too excited about potential of relationship and needs to focus on remaining present.     Suicidal/Homicidal: Nowithout intent/plan  Therapist Response: Assessed pt current functioning per pt report.  Processed w/pt her follow through on steps she identified at last session and effects having.  Reflected to pt positive efforts and steps taken and maintaining focus on being in the present-small daily goals.  Discussed fatigue and sleep and steps  she is taking with this- encouraged to discuss sleep study w/ physician.    Plan: Return again in 2 weeks.  Diagnosis: MDD   [name], [facility] 07/22/2015

## 2015-08-05 ENCOUNTER — Ambulatory Visit (INDEPENDENT_AMBULATORY_CARE_PROVIDER_SITE_OTHER): Payer: BC Managed Care – PPO | Admitting: Psychology

## 2015-08-05 DIAGNOSIS — F330 Major depressive disorder, recurrent, mild: Secondary | ICD-10-CM | POA: Diagnosis not present

## 2015-08-05 NOTE — Progress Notes (Signed)
   THERAPIST PROGRESS NOTE  Session Time: 9am-9.30am  Participation Level: Active  Behavioral Response: Well GroomedAlertEuthymic  Type of Therapy: Individual Therapy  Treatment Goals addressed: Diagnosis: MDD and goal 1.  Interventions: CBT and Supportive  Summary: Birgitta Gehle is a 40 y.o. female who presents with full and bright affect.  Pt reported that she has had about a good month of mood not being depressed.  Pt reported that she feels a little motivation- is accomplishing daily goals. Pt is able to increase awareness about efforts she is making that is impacting.  Pt reports that she is continue to journal, reflect on what has been positive, express gratitude.  Pt also reports she continues to make daily goals- has followed through on these. 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  Pt reports she is meeting w/ friends.  Pt reports she has started an exercise program.  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  Pt reports helpful to come and check in- be reminded of her role in self care and get tune up as needed. Pt does reports still not sleeping well- will go to sleep workshop and talk w/ doctor about to come up w/ plan to proceed.   Suicidal/Homicidal: Nowithout intent/plan  Therapist Response: Assessed pt current functioning per pt report. Processed w/ pt her mood and assisted pt in awareness of efforts she is making that has assisted with this.  Encouraged present focus to continue.      Plan: Return again in 2 weeks.  Diagnosis: MDD    [name], [facility] 08/05/2015

## 2015-08-19 ENCOUNTER — Ambulatory Visit (HOSPITAL_COMMUNITY): Payer: Self-pay | Admitting: Psychology

## 2015-09-02 ENCOUNTER — Ambulatory Visit (INDEPENDENT_AMBULATORY_CARE_PROVIDER_SITE_OTHER): Payer: BLUE CROSS/BLUE SHIELD | Admitting: Psychiatry

## 2015-09-02 VITALS — BP 116/71 | HR 72 | Ht 64.0 in | Wt 183.6 lb

## 2015-09-02 DIAGNOSIS — F339 Major depressive disorder, recurrent, unspecified: Secondary | ICD-10-CM

## 2015-09-02 MED ORDER — SERTRALINE HCL 100 MG PO TABS: 100.0000 mg | ORAL_TABLET | Freq: Every day | ORAL | Status: DC

## 2015-09-02 MED ORDER — ESZOPICLONE 3 MG PO TABS: 3.0000 mg | ORAL_TABLET | Freq: Every evening | ORAL | Status: DC | PRN

## 2015-09-02 NOTE — Progress Notes (Signed)
[facility] MD Progress Note  09/02/2015 4:57 PM Yolanda Wolfe  MRN:  [id number] Subjective:  Spirits are good Principal Problem: Major depression, recurrent episode Diagnosis:  Major depression, recurrent episode Today the patient is actually doing well. She's changed her therapist now seeing someone in the setting, Secundino Ginger is doing well. She is much more stable. Her mood is much improved. The patient feels focused and organized. She denies being depressed on a daily basis. She is sleeping and eating well at this time. She denies any other vegetative symptoms. She denies the use of alcohol or drugs. The patient is doing better on Zoloft. She is complaining however of problems in regard to sleeping. She has fragmented sleep and feels very sleepy during the day. She's eating well does not have any other problems other than this. She's: Making advances in her psychotherapy. The patient denies any physical complaints at this time. She is not suicidal. Patient Active Problem List   Diagnosis Date Noted  . Major depressive disorder, recurrent episode ([place]) [F33.9] 05/07/2015  . Weight gain [R63.5] 06/19/2014  . Depression [F32.9] 06/19/2014  . Routine general medical examination at a health care facility [Z00.00] 06/19/2014  . Insomnia [G47.00] 06/19/2014   Total Time spent with patient: 15 minutes  Past Psychiatric History:   Past Medical History:  Past Medical History  Diagnosis Date  . Depression   . Frequent headaches   . GERD (gastroesophageal reflux disease)   . Heart murmur   . Blood transfusion without reported diagnosis   . Migraine   . UTI (lower urinary tract infection)     Past Surgical History  Procedure Laterality Date  . Abdominal hysterectomy  2009   Family History:  Family History  Problem Relation Age of Onset  . Cancer Maternal Grandmother     breast  . Heart disease Maternal Grandfather   . Alcohol abuse Paternal Grandfather   . Dementia Paternal Grandmother    Family  Psychiatric  History:  Social History:  History  Alcohol Use  . 0.6 oz/week  . 0 Standard drinks or equivalent, 1 Glasses of wine per week     History  Drug Use No    Social History   Social History  . Marital Status: Married    Spouse Name: N/A  . Number of Children: N/A  . Years of Education: N/A   Social History Main Topics  . Smoking status: Never Smoker   . Smokeless tobacco: Never Used  . Alcohol Use: 0.6 oz/week    0 Standard drinks or equivalent, 1 Glasses of wine per week  . Drug Use: No  . Sexual Activity: Yes    Birth Control/ Protection: Surgical   Other Topics Concern  . Not on file   Social History Narrative   Additional Social History:                         Sleep: Poor  Appetite:  Good  Current Medications: Current Outpatient Prescriptions  Medication Sig Dispense Refill  . citalopram (CELEXA) 40 MG tablet Take 40 mg by mouth daily.     . Eszopiclone (ESZOPICLONE) 3 MG TABS Take 1 tablet (3 mg total) by mouth at bedtime as needed. Take immediately before bedtime 30 tablet 4  . sertraline (ZOLOFT) 100 MG tablet Take 1 tablet (100 mg total) by mouth daily. 30 tablet 5  . Suvorexant (BELSOMRA) 5 MG TABS Take 5 mg by mouth at bedtime as needed. (Patient not  taking: Reported on 12/31/2014) 30 tablet 3   No current facility-administered medications for this visit.    Lab Results: No results found for this or any previous visit (from the past 48 hour(s)).  Blood Alcohol level:  No results found for: [facility]  Physical Findings: AIMS:  , ,  ,  ,    CIWA:    COWS:     Musculoskeletal: Strength & Muscle Tone: within normal limits Gait & Station Patient leans: N/A  Psychiatric Specialty Exam: ROS  Blood pressure 116/71, pulse 72, height 5\' 4"  (1.626 m), weight 183 lb 9.6 oz (83.28 kg).Body mass index is 31.5 kg/(m^2).  General Appearance: Fairly Groomed  Engineer, water::  Good  Speech:  Clear and Coherent  Volume:  Normal  Mood:  Euthymic   Affect:  Appropriate  Thought Process:  Coherent  Orientation:  Full (Time, Place, and Person)  Thought Content:  WDL  Suicidal Thoughts:  No  Homicidal Thoughts:  No  Memory:  NA  Judgement:  Good  Insight:  Fair  Psychomotor Activity:  Normal  Concentration:  Good  Recall:  Good  Fund of Knowledge:Good  Language: Good  Akathisia:  No  Handed:  Right  AIMS (if indicated):     Assets:  Financial Resources/Insurance  ADL's:  Intact  Cognition: WNL  Sleep:      Treatment Plan Summary: At this time the patient will continue taking her antidepressant Zoloft. Today we'll go ahead and start her on Lunesta 3 mg as well. She'll stay in her therapy here and return to see me in 3 months. The patient is medically stable. She denies any chest pain or shortness of breath. She denies any neurological symptoms. Overall the patient is actually doing well. I think the therapist is the most important intervention.  Haskel Schroeder, MD 09/02/2015, 4:57 PM

## 2015-09-16 ENCOUNTER — Ambulatory Visit (HOSPITAL_COMMUNITY): Payer: Self-pay | Admitting: Psychology

## 2015-10-07 ENCOUNTER — Ambulatory Visit (HOSPITAL_COMMUNITY): Payer: Self-pay | Admitting: Psychology

## 2015-10-13 ENCOUNTER — Telehealth (HOSPITAL_COMMUNITY): Payer: Self-pay

## 2015-10-13 DIAGNOSIS — F339 Major depressive disorder, recurrent, unspecified: Secondary | ICD-10-CM

## 2015-10-13 NOTE — Telephone Encounter (Signed)
Patient called and states that the Yolanda Wolfe is working to help her fall asleep, however she is still waking up at 4 am and throughout the night. Patient states that she is not getting quality sleep and would like to know what to do.

## 2015-10-14 MED ORDER — TEMAZEPAM 15 MG PO CAPS: 15.0000 mg | ORAL_CAPSULE | Freq: Every evening | ORAL | Status: DC | PRN

## 2015-10-14 NOTE — Telephone Encounter (Signed)
Per Dr. Casimiro Needle, d/c Yolanda Wolfe and start on Remeron 15 mg 1 po qhs, if this does not work increase dose to 2 po qhs. I sent order to the pharmacy and called patient. Voicemail was left for patient with directions and our call back number for questions

## 2015-10-15 ENCOUNTER — Other Ambulatory Visit (HOSPITAL_COMMUNITY): Payer: Self-pay

## 2015-10-15 DIAGNOSIS — F339 Major depressive disorder, recurrent, unspecified: Secondary | ICD-10-CM

## 2015-10-15 NOTE — Telephone Encounter (Signed)
Called in the Restoril to [place]

## 2015-10-28 ENCOUNTER — Ambulatory Visit (INDEPENDENT_AMBULATORY_CARE_PROVIDER_SITE_OTHER): Payer: BLUE CROSS/BLUE SHIELD | Admitting: Psychology

## 2015-10-28 DIAGNOSIS — F330 Major depressive disorder, recurrent, mild: Secondary | ICD-10-CM

## 2015-10-28 DIAGNOSIS — G4700 Insomnia, unspecified: Secondary | ICD-10-CM

## 2015-10-28 NOTE — Progress Notes (Signed)
   THERAPIST PROGRESS NOTE  Session Time: 1.33pm-2.23pm  Participation Level: Active  Behavioral Response: Well GroomedAlertaffect WNL  Type of Therapy: Individual Therapy  Treatment Goals addressed: Diagnosis: MDD and goal 1.  Interventions: CBT and Motivational Interviewing  Summary: Yolanda Wolfe is a 40 y.o. female who presents with full and bright affect.  Pt reported that her mood has been good- only experiences a day or two of down or depressed mood a month.  Pt reported that she has applied to 3 jobs that are a fit and of interest and had an interview that she felt good about.  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  Pt reported that she is falling asleep better on new medication but still wakes exhausted.  Pt reports that she will f/u w/ Dr. Marchelle Gearing.  Pt reported that she naps some days but not daily.  Pt reports that she hasn't made any progress on her dissertation w/in months. Pt reported when she broke her wrist she lost motivation- pt reports that motivation feels like increasing but no followthrough.  Pt acknowledges that she needs more accountability and someone to help keep her on track.  She has contacted peer in cohort that is coaching others.  Pt acknowledges that she gets overwhelmed about thinking about restarting and avoids. Pt was able to identify a plan next week to print off research she has gathered, look at her draft to see "where she is" and send to coach.  Pt discussed frustrations w/ dating and not getting into negative self talk just because dating relationships not working out.   Suicidal/Homicidal: Nowithout intent/plan  Therapist Response: Assessed pt current functioning per pt report.  Processed w/ pt her stressors and reflected her use of good coping skills and progress making.  Had pt identify a plan for self next week to restart w/ her dissertation.  Processed w/pt cognitive distortions about dating and how to  reframe.   Plan: Return again in 2 weeks.  Diagnosis: MDD    [name], [facility] 10/28/2015

## 2015-11-18 ENCOUNTER — Ambulatory Visit (INDEPENDENT_AMBULATORY_CARE_PROVIDER_SITE_OTHER): Payer: BLUE CROSS/BLUE SHIELD | Admitting: Psychology

## 2015-11-18 DIAGNOSIS — F330 Major depressive disorder, recurrent, mild: Secondary | ICD-10-CM

## 2015-11-18 NOTE — Progress Notes (Signed)
   THERAPIST PROGRESS NOTE  Session Time: 1.32pm-2.20pm  Participation Level: Active  Behavioral Response: Well GroomedAlertAFFECT WNL  Type of Therapy: Individual Therapy  Treatment Goals addressed: Diagnosis: MDD and goal 1  Interventions: CBT, Solution Focused and Supportive  Summary: Yolanda Wolfe is a 40 y.o. female who presents with full and bright affect.  Pt reported that she continues to do well w/ mood.  Pt reports that sleep is still improved w/ medication- if doesn't take then doesn't sleep well.  Pt reports she is experiencing vivid dream she will f/u w/ Dr. Casimiro Needle on.  Pt reported that she is still struggling w/ keeping motivation and not procrastinating.  Pt did reported that was helpful when setting day to day goals.  Pt acknowledged that bronchitis also impacted- but that needs to not continue and that pattern now.  Pt agreed to acknowledge when attempting to make excuses for starting later.  Pt reported she did feel good about cleaning up her space at the home and hiring dissertation coach.  Pt discussed next steps w/ work she can do over the next week w/ her dissertation.  Pt also discussed recent date and positive experience this was and not ruminating on whether will work out or not.   Suicidal/Homicidal: Nowithout intent/plan  Therapist Response: Assessed pt current functioning per pt report.  Processed w/pt her mood and discussed motivation/procrastination.  Explored w/pt what success she has had and thoughts she will have that are barriers to moving forward and how to reframe.  Had pt identify her next steps over the next 1-2 weeks and to start in next couple of days.   Plan: Return again in 3 weeks.  Diagnosis: MDD    [name], [facility] 11/18/2015

## 2015-12-04 ENCOUNTER — Ambulatory Visit (INDEPENDENT_AMBULATORY_CARE_PROVIDER_SITE_OTHER): Payer: BLUE CROSS/BLUE SHIELD | Admitting: Psychiatry

## 2015-12-04 VITALS — BP 122/74 | HR 68 | Ht 64.0 in | Wt 175.6 lb

## 2015-12-04 DIAGNOSIS — F323 Major depressive disorder, single episode, severe with psychotic features: Secondary | ICD-10-CM | POA: Diagnosis not present

## 2015-12-04 DIAGNOSIS — F339 Major depressive disorder, recurrent, unspecified: Secondary | ICD-10-CM

## 2015-12-04 MED ORDER — SERTRALINE HCL 100 MG PO TABS: 100.0000 mg | ORAL_TABLET | Freq: Every day | ORAL | Status: DC

## 2015-12-04 MED ORDER — TEMAZEPAM 15 MG PO CAPS: 15.0000 mg | ORAL_CAPSULE | Freq: Every evening | ORAL | Status: DC | PRN

## 2015-12-04 NOTE — Progress Notes (Signed)
Patient ID: Yolanda Wolfe, female   DOB: March 29, 1976, 40 y.o.   MRN: [id number] [facility] MD Progress Note  12/04/2015 11:54 AM Yolanda Wolfe  MRN:  [id number] Subjective:  Spirits are good Principal Problem: Major depression, recurrent episode Diagnosis:  Major depression, recurrent episode Today the patient was seen one time. The patient is actually doing very well. The patient works part-time. She is divorced. She hasn't 85-year-old daughter is doing great. 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 She would like to be a Pharmacist, hospital at a for [employer]. At this time her mood is good. She is sleeping and eating well. She's got good energy. We tried [place] but it failed and now she takes Restoril 15 mg 2 at night for the most part she is sleeping well. She wonders if she is a sleep study because she wakes up in the morning and doesn't feel all that restless. I do not believe she needs a sleep study she is no evidence of snoring or daytime sleepiness. She is not obese. She is not irritable. Medically she is fairly stable. Today we talked about the need for getting a primary care physician. Patient denies the use of alcohol or drugs. At this time she is dating a new man she seems to like a lot. The patient is functioning very well. She's not psychotic not suicidal and is doing well in therapy. Patient Active Problem List   Diagnosis Date Noted  . Major depressive disorder, recurrent episode ([place]) [F33.9] 05/07/2015  . Weight gain [R63.5] 06/19/2014  . Depression [F32.9] 06/19/2014  . Routine general medical examination at a health care facility [Z00.00] 06/19/2014  . Insomnia [G47.00] 06/19/2014   Total Time spent with patient: 15 minutes  Past Psychiatric History:   Past Medical History:  Past Medical History  Diagnosis Date  . Depression   . Frequent headaches   . GERD (gastroesophageal reflux disease)   . Heart murmur   . Blood  transfusion without reported diagnosis   . Migraine   . UTI (lower urinary tract infection)     Past Surgical History  Procedure Laterality Date  . Abdominal hysterectomy  2009   Family History:  Family History  Problem Relation Age of Onset  . Cancer Maternal Grandmother     breast  . Heart disease Maternal Grandfather   . Alcohol abuse Paternal Grandfather   . Dementia Paternal Grandmother    Family Psychiatric  History:  Social History:  History  Alcohol Use  . 0.6 oz/week  . 0 Standard drinks or equivalent, 1 Glasses of wine per week     History  Drug Use No    Social History   Social History  . Marital Status: Married    Spouse Name: N/A  . Number of Children: N/A  . Years of Education: N/A   Social History Main Topics  . Smoking status: Never Smoker   . Smokeless tobacco: Never Used  . Alcohol Use: 0.6 oz/week    0 Standard drinks or equivalent, 1 Glasses of wine per week  . Drug Use: No  . Sexual Activity: Yes    Birth Control/ Protection: Surgical   Other Topics Concern  . Not on file   Social History Narrative   Additional Social History:                         Sleep: Poor  Appetite:  Good  Current Medications: Current Outpatient Prescriptions  Medication Sig Dispense Refill  . citalopram (CELEXA) 40 MG tablet Take 40 mg by mouth daily.     . Eszopiclone (ESZOPICLONE) 3 MG TABS Take 1 tablet (3 mg total) by mouth at bedtime as needed. Take immediately before bedtime 30 tablet 4  . sertraline (ZOLOFT) 100 MG tablet Take 1 tablet (100 mg total) by mouth daily. 30 tablet 6  . Suvorexant (BELSOMRA) 5 MG TABS Take 5 mg by mouth at bedtime as needed. (Patient not taking: Reported on 12/31/2014) 30 tablet 3  . temazepam (RESTORIL) 15 MG capsule Take 1 capsule (15 mg total) by mouth at bedtime as needed for sleep. 60 capsule 5   No current facility-administered medications for this visit.    Lab Results: No results found for this or any  previous visit (from the past 48 hour(s)).  Blood Alcohol level:  No results found for: [facility]  Physical Findings: AIMS:  , ,  ,  ,    CIWA:    COWS:     Musculoskeletal: Strength & Muscle Tone: within normal limits Gait & Station Patient leans: N/A  Psychiatric Specialty Exam: ROS  Blood pressure 122/74, pulse 68, height 5\' 4"  (1.626 m), weight 175 lb 9.6 oz (79.652 kg).Body mass index is 30.13 kg/(m^2).  General Appearance: Fairly Groomed  Engineer, water::  Good  Speech:  Clear and Coherent  Volume:  Normal  Mood:  Euthymic  Affect:  Appropriate  Thought Process:  Coherent  Orientation:  Full (Time, Place, and Person)  Thought Content:  WDL  Suicidal Thoughts:  No  Homicidal Thoughts:  No  Memory:  NA  Judgement:  Good  Insight:  Fair  Psychomotor Activity:  Normal  Concentration:  Good  Recall:  Good  Fund of Knowledge:Good  Language: Good  Akathisia:  No  Handed:  Right  AIMS (if indicated):     Assets:  Financial Resources/Insurance  ADL's:  Intact  Cognition: WNL  Sleep:      Treatment Plan Summary: At this time the patient will continue taking Zoloft 100 mg. Is not affecting her sex drive and I do not think is affecting her weight. She claims that seems to feel that is working well. Now she sleeping fairly well taking Restoril 15 mg 2 at night. The patient will stay in therapy. The patient was given her for all to see Dr. Emi Wolfe at [place]. This patient return to see me in 5 months.  Yolanda Schroeder, MD 12/04/2015, 11:54 AM

## 2015-12-09 ENCOUNTER — Ambulatory Visit (HOSPITAL_COMMUNITY): Payer: Self-pay | Admitting: Psychology

## 2015-12-30 ENCOUNTER — Ambulatory Visit (HOSPITAL_COMMUNITY): Payer: Self-pay | Admitting: Psychology

## 2016-01-18 ENCOUNTER — Ambulatory Visit (INDEPENDENT_AMBULATORY_CARE_PROVIDER_SITE_OTHER): Payer: BLUE CROSS/BLUE SHIELD | Admitting: Psychology

## 2016-01-18 DIAGNOSIS — F330 Major depressive disorder, recurrent, mild: Secondary | ICD-10-CM | POA: Diagnosis not present

## 2016-01-18 NOTE — Progress Notes (Signed)
   THERAPIST PROGRESS NOTE  Session Time: 7.20qm-8.08am  Participation Level: Active  Behavioral Response: Well GroomedAlertAnxious  Type of Therapy: Individual Therapy  Treatment Goals addressed: Diagnosis: MDD and goal 1  Interventions: CBT and Strength-based  Summary: Yolanda Wolfe is a 40 y.o. female who presents with affect full and bright today.  Pt reported that July overall had been more difficult- couple days of depressed mood- not wanting to do anything.  Pt was able to identify that worried, ruminating thoughts led to pattern of negative self worth thinking.  Pt discussed contributing factors of stressors w/ dissertation progress, boyfriend out of town a lot and so change in interaction.  Pt was able to identify distortions and challenge them and come up w/ reframes for self to use in the moment.  Pt also discussed importance of having routine and things to do to help keep moving forward.  Pt discussed fear of disappointing others because lack of progress she has wanted w/ dissertation and in fact supports can be benefit if honest and express needs.  Pt discussed how need to set new plan w/ consultant w/ deadlines.   Suicidal/Homicidal: Nowithout intent/plan  Therapist Response: Assessed pt current functioning per pt report.  Processed w/pt her mood and contributing thoughts and stressors.  Assisted pt in challenging distortions and identifying plan for coping.  Discussed routine for self care and that assist w/ motivation.    Plan: Return again in 2 weeks.  Diagnosis: MDD    [name], [facility] 01/18/2016

## 2016-02-01 ENCOUNTER — Other Ambulatory Visit (HOSPITAL_COMMUNITY): Payer: Self-pay | Admitting: Psychiatry

## 2016-02-01 DIAGNOSIS — F339 Major depressive disorder, recurrent, unspecified: Secondary | ICD-10-CM

## 2016-02-17 ENCOUNTER — Ambulatory Visit (HOSPITAL_COMMUNITY): Payer: Self-pay | Admitting: Psychology

## 2016-03-02 ENCOUNTER — Ambulatory Visit (HOSPITAL_COMMUNITY): Payer: Self-pay | Admitting: Psychology

## 2016-03-23 ENCOUNTER — Ambulatory Visit (INDEPENDENT_AMBULATORY_CARE_PROVIDER_SITE_OTHER): Payer: BLUE CROSS/BLUE SHIELD | Admitting: Psychology

## 2016-03-23 DIAGNOSIS — F3341 Major depressive disorder, recurrent, in partial remission: Secondary | ICD-10-CM

## 2016-03-23 NOTE — Progress Notes (Signed)
   THERAPIST PROGRESS NOTE  Session Time: 1.30pm-2.08pm,  Participation Level: Active  Behavioral Response: Well GroomedAlertEuthymic  Type of Therapy: Individual Therapy  Treatment Goals addressed: Diagnosis: mDD and goal 1.  Interventions: CBT and Strength-based  Summary: Nigel Yong is a 40 y.o. female who presents with full and bright affect.  Pt reported that she has been doing well this past month.  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  Pt reported worries she was having about her dating relationship is settling and they are continuing forward in serious relationship.  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 Pt reports that this has created good structure and routine that she feels is helpful.  Pt reports that sleep still isn't best- medication helps to get to sleep and wakes less- but that she is aware she needs to changes some unhealthy routines not consistent w/ good sleep- tv in bed, phone in bed.  Pt discussed another private unwind space for relaxing instead.    Suicidal/Homicidal: Nowithout intent/plan  Therapist Response: Assessed pt current functioning per pt report.  Processed w/pt her report of improvements and contributing factors of her efforts and self care.  Discussed sleep routine and changing behaviors that might assist in better sleep.   Plan: Return again in 3 weeks.  Diagnosis: MDD    [name], [facility] 03/23/2016

## 2016-04-13 ENCOUNTER — Ambulatory Visit (HOSPITAL_COMMUNITY): Payer: Self-pay | Admitting: Psychology

## 2016-04-14 ENCOUNTER — Ambulatory Visit (HOSPITAL_COMMUNITY): Payer: Self-pay | Admitting: Psychology

## 2016-04-27 ENCOUNTER — Ambulatory Visit (INDEPENDENT_AMBULATORY_CARE_PROVIDER_SITE_OTHER): Payer: BLUE CROSS/BLUE SHIELD | Admitting: Psychology

## 2016-04-27 ENCOUNTER — Ambulatory Visit (HOSPITAL_COMMUNITY): Payer: Self-pay | Admitting: Psychology

## 2016-04-27 DIAGNOSIS — F3341 Major depressive disorder, recurrent, in partial remission: Secondary | ICD-10-CM

## 2016-04-27 NOTE — Progress Notes (Signed)
   THERAPIST PROGRESS NOTE  Session Time: 12.30pm-1.13pm  Participation Level: Active  Behavioral Response: Well GroomedAlertEuthymic  Type of Therapy: Individual Therapy  Treatment Goals addressed: Diagnosis: MDD and goal 1.  Interventions: CBT and Supportive  Summary: Yasaira Rosengrant is a 40 y.o. female who presents with full and bright affect.  Pt reported that things have been going "really good" for her.  Pt reported that mood has been positive, she is getting her dissertation done, working her jobs and feeling productive.  Pt reports that sleep is improved- but still struggles w/ initiating sleep some nights. Pt has become more aware of routine and schedule that assists w/ this but doesn't always chose to follow.  Pt discussed positive interactions w/ family and boyfriend.  Pt reported that change in schedule w/ shared custody isn't as much to her liking but giving time to adjust.  Pt feels that she has good communication w/ her ex and will be able to determine how to problem solve together if needed.  Pt reports at this time shorten days not having a negative impact.    Suicidal/Homicidal: Nowithout intent/plan  Therapist Response: Assessed pt current functioning per pt report. Processed w/pt her interactions w/ her family friends, progress making, pt role in this.  Explored w/pt stressors and self care going into winter months.  Plan: Return again in 4 weeks.  Diagnosis: MDD   [name], [facility] 04/27/2016

## 2016-05-04 ENCOUNTER — Ambulatory Visit (HOSPITAL_COMMUNITY): Payer: Self-pay | Admitting: Psychology

## 2016-05-06 ENCOUNTER — Ambulatory Visit (HOSPITAL_COMMUNITY): Payer: Self-pay | Admitting: Psychiatry

## 2016-05-27 ENCOUNTER — Ambulatory Visit (HOSPITAL_COMMUNITY): Payer: Self-pay | Admitting: Psychiatry

## 2016-06-10 ENCOUNTER — Ambulatory Visit (HOSPITAL_COMMUNITY): Payer: Self-pay | Admitting: Psychiatry

## 2016-06-15 ENCOUNTER — Ambulatory Visit (HOSPITAL_COMMUNITY): Payer: Self-pay | Admitting: Psychology

## 2016-07-07 ENCOUNTER — Ambulatory Visit (HOSPITAL_COMMUNITY): Payer: Self-pay | Admitting: Psychiatry

## 2016-08-01 ENCOUNTER — Other Ambulatory Visit (HOSPITAL_COMMUNITY): Payer: Self-pay

## 2016-08-01 DIAGNOSIS — F339 Major depressive disorder, recurrent, unspecified: Secondary | ICD-10-CM

## 2016-08-01 MED ORDER — TEMAZEPAM 15 MG PO CAPS: 15.0000 mg | ORAL_CAPSULE | Freq: Every evening | ORAL | 2 refills | Status: DC | PRN

## 2016-09-02 ENCOUNTER — Other Ambulatory Visit (HOSPITAL_COMMUNITY): Payer: Self-pay | Admitting: Psychiatry

## 2016-09-12 ENCOUNTER — Other Ambulatory Visit (HOSPITAL_COMMUNITY): Payer: Self-pay

## 2016-09-12 MED ORDER — SERTRALINE HCL 100 MG PO TABS: 100.0000 mg | ORAL_TABLET | Freq: Every day | ORAL | 0 refills | Status: DC

## 2016-09-21 ENCOUNTER — Ambulatory Visit (INDEPENDENT_AMBULATORY_CARE_PROVIDER_SITE_OTHER): Payer: BLUE CROSS/BLUE SHIELD | Admitting: Psychology

## 2016-09-21 ENCOUNTER — Encounter (INDEPENDENT_AMBULATORY_CARE_PROVIDER_SITE_OTHER): Payer: Self-pay

## 2016-09-21 DIAGNOSIS — F330 Major depressive disorder, recurrent, mild: Secondary | ICD-10-CM | POA: Diagnosis not present

## 2016-09-21 NOTE — Progress Notes (Signed)
   THERAPIST PROGRESS NOTE  Session Time: 10.03am-10.48am  Participation Level: Active  Behavioral Response: Well GroomedAlertaffect bright.  Type of Therapy: Individual Therapy  Treatment Goals addressed: Diagnosis: MDd and goal 1.  Interventions: CBT and Supportive  Summary: Yolanda Wolfe is a 41 y.o. female who presents with full and bright affect.  Pt reported that things are going really well overall. 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  Pt reported that she is working several part time jobs which has allowed her to be financially independent.  Pt discusses that continuing to look for full time job for consistency and ease.  Pt reported that main stressors recent have been communication w/ ex husband and schedule w/ daughter.  Pt reported that sad over weekend as w/ current schedule exhusband had daughter for [place], her birthday and this past year had her for the major holidays.  Pt also feels that he has been more distant in  Connecting to coparent on things.  Pt acknowledged that feelings were valid and that sadness didn't lead to depressive episode and she coped through w/ supports.  Pt also discussed plan for communicating w/ ex re: her concerns.    Suicidal/Homicidal: Nowithout intent/plan  Therapist Response: Assessed pt current functioning per pt report.  Processed w/pt coping w/ stressors recent and reflected improved functioning.  Explored w/pt her plans on communicating towards resolution w/ ex.    Plan: Return again in 6 weeks.  Diagnosis: MDD    [name], [facility] 09/21/2016

## 2016-09-22 ENCOUNTER — Encounter (HOSPITAL_COMMUNITY): Payer: Self-pay | Admitting: Psychiatry

## 2016-09-22 ENCOUNTER — Ambulatory Visit (INDEPENDENT_AMBULATORY_CARE_PROVIDER_SITE_OTHER): Payer: BLUE CROSS/BLUE SHIELD | Admitting: Psychiatry

## 2016-09-22 VITALS — BP 122/74 | HR 85 | Ht 64.0 in | Wt 183.6 lb

## 2016-09-22 DIAGNOSIS — F339 Major depressive disorder, recurrent, unspecified: Secondary | ICD-10-CM | POA: Diagnosis not present

## 2016-09-22 DIAGNOSIS — Z811 Family history of alcohol abuse and dependence: Secondary | ICD-10-CM

## 2016-09-22 DIAGNOSIS — F325 Major depressive disorder, single episode, in full remission: Secondary | ICD-10-CM

## 2016-09-22 DIAGNOSIS — Z810 Family history of intellectual disabilities: Secondary | ICD-10-CM

## 2016-09-22 DIAGNOSIS — Z79899 Other long term (current) drug therapy: Secondary | ICD-10-CM | POA: Diagnosis not present

## 2016-09-22 MED ORDER — SERTRALINE HCL 100 MG PO TABS: 100.0000 mg | ORAL_TABLET | Freq: Every day | ORAL | 8 refills | Status: DC

## 2016-09-22 MED ORDER — TEMAZEPAM 15 MG PO CAPS: 15.0000 mg | ORAL_CAPSULE | Freq: Every evening | ORAL | 5 refills | Status: DC | PRN

## 2016-09-22 NOTE — Progress Notes (Signed)
Patient ID: Yolanda Wolfe, female   DOB: 1975/10/27, 41 y.o.   MRN: [id number] [facility] MD Progress Note  09/22/2016 4:12 PM Yolanda Wolfe  MRN:  [id number] Subjective:  Spirits are good Principal Problem: Major depression, recurrent episode Diagnosis:  Major depression, recurrent episode At this time the patient is doing very well. She has a good relationship with her boyfriend. Her daughter likes. He has 2 daughters that are older daughter likes them very much. Overall the patient is sleeping and eating very well. The patient is just about to get her PhD. Her thinking concentration is good. Patient does not drink any alcohol use any drugs she sees her primary care doctor on a regular basis. She's in good spirits. She shows no evidence of psychosis. She denies chest pain or shortness of breath. Physically very healthy. Patient Active Problem List   Diagnosis Date Noted  . Major depressive disorder, recurrent episode ([place]) [F33.9] 05/07/2015  . Weight gain [R63.5] 06/19/2014  . Depression [F32.9] 06/19/2014  . Routine general medical examination at a health care facility [Z00.00] 06/19/2014  . Insomnia [G47.00] 06/19/2014   Total Time spent with patient: 15 minutes  Past Psychiatric History:   Past Medical History:  Past Medical History:  Diagnosis Date  . Blood transfusion without reported diagnosis   . Depression   . Frequent headaches   . GERD (gastroesophageal reflux disease)   . Heart murmur   . Migraine   . UTI (lower urinary tract infection)     Past Surgical History:  Procedure Laterality Date  . ABDOMINAL HYSTERECTOMY  2009   Family History:  Family History  Problem Relation Age of Onset  . Cancer Maternal Grandmother     breast  . Heart disease Maternal Grandfather   . Alcohol abuse Paternal Grandfather   . Dementia Paternal Grandmother    Family Psychiatric  History:  Social History:  History  Alcohol Use  . 0.6 oz/week  . 1 Glasses of wine per week     History   Drug Use No    Social History   Social History  . Marital status: Married    Spouse name: N/A  . Number of children: N/A  . Years of education: N/A   Social History Main Topics  . Smoking status: Never Smoker  . Smokeless tobacco: Never Used  . Alcohol use 0.6 oz/week    1 Glasses of wine per week  . Drug use: No  . Sexual activity: Yes    Birth control/ protection: Surgical   Other Topics Concern  . None   Social History Narrative  . None   Additional Social History:                         Sleep: Poor  Appetite:  Good  Current Medications: Current Outpatient Prescriptions  Medication Sig Dispense Refill  . citalopram (CELEXA) 40 MG tablet Take 40 mg by mouth daily.     . sertraline (ZOLOFT) 100 MG tablet Take 1 tablet (100 mg total) by mouth daily. 30 tablet 8  . temazepam (RESTORIL) 15 MG capsule Take 1 capsule (15 mg total) by mouth at bedtime as needed for sleep. 60 capsule 5   No current facility-administered medications for this visit.     Lab Results: No results found for this or any previous visit (from the past 48 hour(s)).  Blood Alcohol level:  No results found for: [facility]  Physical Findings: AIMS:  , ,  ,  ,  CIWA:    COWS:     Musculoskeletal: Strength & Muscle Tone: within normal limits Gait & Station Patient leans: N/A  Psychiatric Specialty Exam: ROS  Blood pressure 122/74, pulse 85, height 5\' 4"  (1.626 m), weight 183 lb 9.6 oz (83.3 kg).Body mass index is 31.51 kg/m.  General Appearance: Fairly Groomed  Engineer, water::  Good  Speech:  Clear and Coherent  Volume:  Normal  Mood:  Euthymic  Affect:  Appropriate  Thought Process:  Coherent  Orientation:  Full (Time, Place, and Person)  Thought Content:  WDL  Suicidal Thoughts:  No  Homicidal Thoughts:  No  Memory:  NA  Judgement:  Good  Insight:  Fair  Psychomotor Activity:  Normal  Concentration:  Good  Recall:  Good  Fund of Knowledge:Good  Language: Good   Akathisia:  No  Handed:  Right  AIMS (if indicated):     Assets:  Financial Resources/Insurance  ADL's:  Intact  Cognition: WNL  Sleep:      Treatment Plan Summary: At this time the patient will continue taking Zoloft 100 mg. Is not affecting her sex drive and I do not think is affecting her weight. She claims that seems to feel that is working well. Now she sleeping fairly well taking Restoril 15 mg 2 at night. The patient will stay in therapy. The patient was given her for all to see Dr. Emi Belfast at [place]. This patient return to see me in 5 months.  Jerral Ralph, MD 09/22/2016, 4:12 PM Patient ID: Yolanda Wolfe, female   DOB: 1976/03/07, 41 y.o.   MRN: [id number] [facility] MD Progress Note  09/22/2016 1:49 PM Yolanda Wolfe  MRN:  [id number] Subjective:  Spirits are good Principal Problem: Major depression, recurrent episode Diagnosis:  Major depression, recurrent episode + Total Time spent with patient: 15 minutes  Past Psychiatric History:   Past Medical History:  Past Medical History:  Diagnosis Date  . Blood transfusion without reported diagnosis   . Depression   . Frequent headaches   . GERD (gastroesophageal reflux disease)   . Heart murmur   . Migraine   . UTI (lower urinary tract infection)     Past Surgical History:  Procedure Laterality Date  . ABDOMINAL HYSTERECTOMY  2009   Family History:  Family History  Problem Relation Age of Onset  . Cancer Maternal Grandmother     breast  . Heart disease Maternal Grandfather   . Alcohol abuse Paternal Grandfather   . Dementia Paternal Grandmother    Family Psychiatric  History:  Social History:  History  Alcohol Use  . 0.6 oz/week  . 1 Glasses of wine per week     History  Drug Use No    Social History   Social History  . Marital status: Married    Spouse name: N/A  . Number of children: N/A  . Years of education: N/A   Social History Main Topics  . Smoking status: Never Smoker  .  Smokeless tobacco: Never Used  . Alcohol use 0.6 oz/week    1 Glasses of wine per week  . Drug use: No  . Sexual activity: Yes    Birth control/ protection: Surgical   Other Topics Concern  . None   Social History Narrative  . None   Additional Social History:                         Sleep: Poor  Appetite:  Good  Current Medications: Current Outpatient Prescriptions  Medication Sig Dispense Refill  . citalopram (CELEXA) 40 MG tablet Take 40 mg by mouth daily.     . sertraline (ZOLOFT) 100 MG tablet Take 1 tablet (100 mg total) by mouth daily. 30 tablet 8  . temazepam (RESTORIL) 15 MG capsule Take 1 capsule (15 mg total) by mouth at bedtime as needed for sleep. 60 capsule 5   No current facility-administered medications for this visit.     Lab Results: No results found for this or any previous visit (from the past 48 hour(s)).  Blood Alcohol level:  No results found for: [facility]  Physical Findings: AIMS:  , ,  ,  ,    CIWA:    COWS:     Musculoskeletal: Strength & Muscle Tone: within normal limits Gait & Station Patient leans: N/A  Psychiatric Specialty Exam: ROS  Blood pressure 122/74, pulse 85, height 5\' 4"  (1.626 m), weight 183 lb 9.6 oz (83.3 kg).Body mass index is 31.51 kg/m.  General Appearance: Fairly Groomed  Engineer, water::  Good  Speech:  Clear and Coherent  Volume:  Normal  Mood:  Euthymic  Affect:  Appropriate  Thought Process:  Coherent  Orientation:  Full (Time, Place, and Person)  Thought Content:  WDL  Suicidal Thoughts:  No  Homicidal Thoughts:  No  Memory:  NA  Judgement:  Good  Insight:  Fair  Psychomotor Activity:  Normal  Concentration:  Good  Recall:  Good  Fund of Knowledge:Good  Language: Good  Akathisia:  No  Handed:  Right  AIMS (if indicated):     Assets:  Financial Resources/Insurance  ADL's:  Intact  Cognition: WNL  Sleep:      Treatment Plan Summary: At this time the patient continue Zoloft as prescribed. He  does not cause any side effects. She sexually active. She's got good energy. She also takes Restoril 50 mg 2 at night. These 2 medicines are very helpful for this patient. She is functioning extremely well. She'll return to see me in 4 months for a 30 minute visit. Jerral Ralph, MD 09/22/2016, 1:49 PM

## 2016-11-02 ENCOUNTER — Ambulatory Visit (INDEPENDENT_AMBULATORY_CARE_PROVIDER_SITE_OTHER): Payer: BLUE CROSS/BLUE SHIELD | Admitting: Psychology

## 2016-11-02 DIAGNOSIS — F325 Major depressive disorder, single episode, in full remission: Secondary | ICD-10-CM | POA: Diagnosis not present
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
## 2016-11-07 ENCOUNTER — Other Ambulatory Visit (HOSPITAL_COMMUNITY): Payer: Self-pay

## 2016-11-07 NOTE — Telephone Encounter (Signed)
Medication refill request - Fax received from patient's [facility] pharmacy for a 90 day order for patient's prescribed Sertraline in place of 30 day orders, last provided 09/22/16 + 8 refills.

## 2016-11-08 MED ORDER — SERTRALINE HCL 100 MG PO TABS: 100.0000 mg | ORAL_TABLET | Freq: Every day | ORAL | 2 refills | Status: DC

## 2016-11-08 NOTE — Telephone Encounter (Signed)
I sent in a 90 day with 2 refills to equal what was originally sent in

## 2016-12-14 ENCOUNTER — Ambulatory Visit (INDEPENDENT_AMBULATORY_CARE_PROVIDER_SITE_OTHER): Payer: BLUE CROSS/BLUE SHIELD | Admitting: Psychology

## 2016-12-14 DIAGNOSIS — F325 Major depressive disorder, single episode, in full remission: Secondary | ICD-10-CM | POA: Diagnosis not present

## 2016-12-14 NOTE — Progress Notes (Signed)
   THERAPIST PROGRESS NOTE  Session Time: 3.36pm-4.pm  Participation Level: Active  Behavioral Response: Well GroomedAlertaffect bright  Type of Therapy: Individual Therapy  Treatment Goals addressed: Diagnosis: MDd and goal .1  Interventions: CBT and Supportive  Summary: Yolanda Wolfe is a 41 y.o. female who presents with full and bright affect.  Pt reported that transition to summer has been good.  Pt reported that her mood has been good overall- 2 incidents of feeling depressed w/ worry of future things.  Pt reported that didn't maintain and was able to cope through and mood return to normal.  Pt reported that she has been hired as the Insurance account manager at [employer] and feels good about full time job. Pt reported on upcoming travels w/ boyfriend and family.  Pt feels that she is managing well and meeting her goals.    Suicidal/Homicidal: Nowithout intent/plan  Therapist Response: assessed pt current functioning per pt report.  Processed w/pt coping w/ cognitive distortions and ways to challenge.  Discussed plan for care.  Plan: Return again in 1 month- continue appointments through fall as transitions into new job- consider discharging if maintains improvements.   Diagnosis: MDD, in remission    YATES,LEANNE, LPC 12/14/2016

## 2016-12-31 ENCOUNTER — Other Ambulatory Visit (HOSPITAL_COMMUNITY): Payer: Self-pay | Admitting: Psychiatry

## 2016-12-31 DIAGNOSIS — F339 Major depressive disorder, recurrent, unspecified: Secondary | ICD-10-CM

## 2017-01-04 ENCOUNTER — Other Ambulatory Visit (HOSPITAL_COMMUNITY): Payer: Self-pay

## 2017-01-04 DIAGNOSIS — F339 Major depressive disorder, recurrent, unspecified: Secondary | ICD-10-CM

## 2017-01-04 MED ORDER — TEMAZEPAM 15 MG PO CAPS: 30.0000 mg | ORAL_CAPSULE | Freq: Every evening | ORAL | 0 refills | Status: DC | PRN

## 2017-01-18 ENCOUNTER — Ambulatory Visit (HOSPITAL_COMMUNITY): Payer: Self-pay | Admitting: Psychology

## 2017-01-25 ENCOUNTER — Ambulatory Visit (INDEPENDENT_AMBULATORY_CARE_PROVIDER_SITE_OTHER): Payer: BLUE CROSS/BLUE SHIELD | Admitting: Psychiatry

## 2017-01-25 ENCOUNTER — Encounter (HOSPITAL_COMMUNITY): Payer: Self-pay | Admitting: Psychiatry

## 2017-01-25 VITALS — BP 120/81 | HR 76 | Ht 64.0 in | Wt 188.8 lb

## 2017-01-25 DIAGNOSIS — F339 Major depressive disorder, recurrent, unspecified: Secondary | ICD-10-CM | POA: Diagnosis not present

## 2017-01-25 DIAGNOSIS — F325 Major depressive disorder, single episode, in full remission: Secondary | ICD-10-CM

## 2017-01-25 DIAGNOSIS — Z811 Family history of alcohol abuse and dependence: Secondary | ICD-10-CM | POA: Diagnosis not present

## 2017-01-25 DIAGNOSIS — Z810 Family history of intellectual disabilities: Secondary | ICD-10-CM | POA: Diagnosis not present

## 2017-01-25 MED ORDER — TEMAZEPAM 15 MG PO CAPS: 30.0000 mg | ORAL_CAPSULE | Freq: Every evening | ORAL | 5 refills | Status: DC | PRN

## 2017-01-25 MED ORDER — SERTRALINE HCL 100 MG PO TABS: 100.0000 mg | ORAL_TABLET | Freq: Every day | ORAL | 2 refills | Status: DC

## 2017-01-25 NOTE — Progress Notes (Signed)
Patient ID: Yolanda Wolfe, female   DOB: May 17, 1976, 41 y.o.   MRN: [id number] [facility] MD Progress Note  01/25/2017 1:46 PM Yolanda Wolfe  MRN:  [id number] Subjective:  Spirits are good Principal Problem: Major depression, recurrent episode Today the patient is doing great. 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 She doing very well. Her mood is good. She is sleeping and eating well. She's got good energy. She is concentrating very well. Her relationship with right going very well. She is a positive relationship. She loves a great deal. Patient says her medications are working very well. She is well. She denies the use of alcohol or drugs. She has no psychotic symptoms. Physically she feels very well. She's possibly and optimistic. She shows no signs of psychosis she is very pleased that she got a PhD feels good about himself.. Patient Active Problem List   Diagnosis Date Noted  . Major depressive disorder, recurrent episode ([place]) [F33.9] 05/07/2015  . Weight gain [R63.5] 06/19/2014  . Depression [F32.9] 06/19/2014  . Routine general medical examination at a health care facility [Z00.00] 06/19/2014  . Insomnia [G47.00] 06/19/2014   Total Time spent with patient: 15 minutes  Past Psychiatric History:   Past Medical History:  Past Medical History:  Diagnosis Date  . Blood transfusion without reported diagnosis   . Depression   . Frequent headaches   . GERD (gastroesophageal reflux disease)   . Heart murmur   . Migraine   . UTI (lower urinary tract infection)     Past Surgical History:  Procedure Laterality Date  . ABDOMINAL HYSTERECTOMY  2009   Family History:  Family History  Problem Relation Age of Onset  . Cancer Maternal Grandmother        breast  . Heart disease Maternal Grandfather   . Alcohol abuse Paternal Grandfather   . Dementia Paternal Grandmother    Family Psychiatric  History:  Social History:  History  Alcohol Use  .  0.6 oz/week  . 1 Glasses of wine per week     History  Drug Use No    Social History   Social History  . Marital status: Married    Spouse name: N/A  . Number of children: N/A  . Years of education: N/A   Social History Main Topics  . Smoking status: Never Smoker  . Smokeless tobacco: Never Used  . Alcohol use 0.6 oz/week    1 Glasses of wine per week  . Drug use: No  . Sexual activity: Yes    Birth control/ protection: Surgical   Other Topics Concern  . None   Social History Narrative  . None   Additional Social History:                         Sleep: Poor  Appetite:  Good  Current Medications: Current Outpatient Prescriptions  Medication Sig Dispense Refill  . citalopram (CELEXA) 40 MG tablet Take 40 mg by mouth daily.     . sertraline (ZOLOFT) 100 MG tablet Take 1 tablet (100 mg total) by mouth daily. 90 tablet 2  . temazepam (RESTORIL) 15 MG capsule Take 2 capsules (30 mg total) by mouth at bedtime as needed for sleep. 60 capsule 5   No current facility-administered medications for this visit.     Lab Results: No results found for this or any previous visit (from the past 48 hour(s)).  Blood Alcohol  level:  No results found for: [facility]  Physical Findings: AIMS:  , ,  ,  ,    CIWA:    COWS:     Musculoskeletal: Strength & Muscle Tone: within normal limits Gait & Station Patient leans: N/A  Psychiatric Specialty Exam: ROS  Blood pressure 120/81, pulse 76, height 5\' 4"  (1.626 m), weight 188 lb 12.8 oz (85.6 kg), SpO2 97 %.Body mass index is 32.41 kg/m.  General Appearance: Fairly Groomed  Engineer, water::  Good  Speech:  Clear and Coherent  Volume:  Normal  Mood:  Euthymic  Affect:  Appropriate  Thought Process:  Coherent  Orientation:  Full (Time, Place, and Person)  Thought Content:  WDL  Suicidal Thoughts:  No  Homicidal Thoughts:  No  Memory:  NA  Judgement:  Good  Insight:  Fair  Psychomotor Activity:  Normal  Concentration:   Good  Recall:  Good  Fund of Knowledge:Good  Language: Good  Akathisia:  No  Handed:  Right  AIMS (if indicated):     Assets:  Financial Resources/Insurance  ADL's:  Intact  Cognition: WNL  Sleep:      Treatment Plan Summary: At this time the patient will continue taking Zoloft 100 mg. Is not affecting her sex drive and I do not think is affecting her weight. She claims that seems to feel that is working well. Now she sleeping fairly well taking Restoril 15 mg 2 at night. The patient will stay in therapy. The patient was given her for all to see Dr. Emi Belfast at [place]. This patient return to see me in 5 months.  Jerral Ralph, MD 01/25/2017, 1:46 PM Patient ID: Yolanda Wolfe, female   DOB: 12-04-1975, 41 y.o.   MRN: [id number] [facility] MD Progress Note  01/25/2017 1:46 PM Yolanda Wolfe  MRN:  [id number] Subjective:  Spirits are good Principal Problem: Major depression, recurrent episode Diagnosis:  Major depression, recurrent episode + Total Time spent with patient: 15 minutes  Past Psychiatric History:   Past Medical History:  Past Medical History:  Diagnosis Date  . Blood transfusion without reported diagnosis   . Depression   . Frequent headaches   . GERD (gastroesophageal reflux disease)   . Heart murmur   . Migraine   . UTI (lower urinary tract infection)     Past Surgical History:  Procedure Laterality Date  . ABDOMINAL HYSTERECTOMY  2009   Family History:  Family History  Problem Relation Age of Onset  . Cancer Maternal Grandmother        breast  . Heart disease Maternal Grandfather   . Alcohol abuse Paternal Grandfather   . Dementia Paternal Grandmother    Family Psychiatric  History:  Social History:  History  Alcohol Use  . 0.6 oz/week  . 1 Glasses of wine per week     History  Drug Use No    Social History   Social History  . Marital status: Married    Spouse name: N/A  . Number of children: N/A  . Years of education: N/A    Social History Main Topics  . Smoking status: Never Smoker  . Smokeless tobacco: Never Used  . Alcohol use 0.6 oz/week    1 Glasses of wine per week  . Drug use: No  . Sexual activity: Yes    Birth control/ protection: Surgical   Other Topics Concern  . None   Social History Narrative  . None   Additional Social History:  Sleep: Poor  Appetite:  Good  Current Medications: Current Outpatient Prescriptions  Medication Sig Dispense Refill  . citalopram (CELEXA) 40 MG tablet Take 40 mg by mouth daily.     . sertraline (ZOLOFT) 100 MG tablet Take 1 tablet (100 mg total) by mouth daily. 90 tablet 2  . temazepam (RESTORIL) 15 MG capsule Take 2 capsules (30 mg total) by mouth at bedtime as needed for sleep. 60 capsule 5   No current facility-administered medications for this visit.     Lab Results: No results found for this or any previous visit (from the past 48 hour(s)).  Blood Alcohol level:  No results found for: [facility]  Physical Findings: AIMS:  , ,  ,  ,    CIWA:    COWS:     Musculoskeletal: Strength & Muscle Tone: within normal limits Gait & Station Patient leans: N/A  Psychiatric Specialty Exam: ROS  Blood pressure 120/81, pulse 76, height 5\' 4"  (1.626 m), weight 188 lb 12.8 oz (85.6 kg), SpO2 97 %.Body mass index is 32.41 kg/m.  General Appearance: Fairly Groomed  Engineer, water::  Good  Speech:  Clear and Coherent  Volume:  Normal  Mood:  Euthymic  Affect:  Appropriate  Thought Process:  Coherent  Orientation:  Full (Time, Place, and Person)  Thought Content:  WDL  Suicidal Thoughts:  No  Homicidal Thoughts:  No  Memory:  NA  Judgement:  Good  Insight:  Fair  Psychomotor Activity:  Normal  Concentration:  Good  Recall:  Good  Fund of Knowledge:Good  Language: Good  Akathisia:  No  Handed:  Right  AIMS (if indicated):     Assets:  Financial Resources/Insurance  ADL's:  Intact  Cognition: WNL  Sleep:       Treatment Plan Summary: At this time the patient continue taking Zoloft 100 mg as prescribed. She'll continue Restoril 15 mg 2 at night. The patient is under 1 problem is in fact been clinically depressed but is doing very well on his medicines. I see no reason to change him at this time. Perhaps her next session we'll review how long he really needs to be on Zoloft. Is not having a negative side effects at all. This patient to return to see me in 5 months for just a 15 minute check

## 2017-04-02 ENCOUNTER — Other Ambulatory Visit (HOSPITAL_COMMUNITY): Payer: Self-pay | Admitting: Psychiatry

## 2017-06-09 ENCOUNTER — Other Ambulatory Visit (HOSPITAL_COMMUNITY): Payer: Self-pay

## 2017-06-09 DIAGNOSIS — F339 Major depressive disorder, recurrent, unspecified: Secondary | ICD-10-CM

## 2017-06-09 MED ORDER — TEMAZEPAM 30 MG PO CAPS: 30.0000 mg | ORAL_CAPSULE | Freq: Every evening | ORAL | 0 refills | Status: DC | PRN

## 2017-06-28 ENCOUNTER — Ambulatory Visit (INDEPENDENT_AMBULATORY_CARE_PROVIDER_SITE_OTHER): Payer: Commercial Managed Care - PPO | Admitting: Psychiatry

## 2017-06-28 ENCOUNTER — Encounter (HOSPITAL_COMMUNITY): Payer: Self-pay | Admitting: Psychiatry

## 2017-06-28 VITALS — BP 118/76 | HR 78 | Ht 64.0 in | Wt 188.0 lb

## 2017-06-28 DIAGNOSIS — Z811 Family history of alcohol abuse and dependence: Secondary | ICD-10-CM

## 2017-06-28 DIAGNOSIS — F332 Major depressive disorder, recurrent severe without psychotic features: Secondary | ICD-10-CM

## 2017-06-28 DIAGNOSIS — Z810 Family history of intellectual disabilities: Secondary | ICD-10-CM | POA: Diagnosis not present

## 2017-06-28 DIAGNOSIS — F3342 Major depressive disorder, recurrent, in full remission: Secondary | ICD-10-CM

## 2017-06-28 DIAGNOSIS — F339 Major depressive disorder, recurrent, unspecified: Secondary | ICD-10-CM

## 2017-06-28 MED ORDER — TEMAZEPAM 30 MG PO CAPS: 30.0000 mg | ORAL_CAPSULE | Freq: Every evening | ORAL | 5 refills | Status: DC | PRN

## 2017-06-28 NOTE — Progress Notes (Signed)
Patient ID: Yolanda Wolfe, female   DOB: January 12, 1976, 42 y.o.   MRN: [id number] [facility] MD Progress Note  06/28/2017 2:06 PM Yolanda Wolfe  MRN:  [id number] Subjective:  Spirits are good Principal Problem: Major depression, recurrent episode Today the patient is doing well. 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 She likes a great deal. She likes the people for the most part that she works with. She likes her principal great deal. She believes is relatively secure gets paid just about adequately. She's got a good relationship with a boyfriend Yolanda Wolfe for almost a year and a half. She loves and communicates with negotiate with him. They've discussed marriage in the future. Her 20 year old daughter Yolanda Wolfe is doing well. The patient denies daily depression. She is having some problems sleeping as she has interrupted sleep. She recently saw Dr. Maxwell Caul for sleep evaluation. That evaluation is ongoing. The patient uses no alcohol. She uses no drugs. She is very responsible for everything she does. She sees a therapist here on a when necessary basis and hasn't needed see her for quite a while. The patient feels very stable. At this time we reviewed the fact that she had a distinct episode of major depression when I met her. She was started on Zoloft and did well. She's been this stable now for over 2 years. 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 Therefore I think of her as having major depression single episode. Today we discussed the process of coming off the Zoloft. It is noted that she slowly gained weight and I suggested possible. Possible that Zoloft is attributing to that that also has been with her poor eating habits and the fact that she doesn't exercise. Nonetheless she's completely interested in tapering off her Zoloft. Patient Active Problem List   Diagnosis Date Noted  . Major depressive  disorder, recurrent episode ([place]) [F33.9] 05/07/2015  . Weight gain [R63.5] 06/19/2014  . Depression [F32.9] 06/19/2014  . Routine general medical examination at a health care facility [Z00.00] 06/19/2014  . Insomnia [G47.00] 06/19/2014   Total Time spent with patient: 15 minutes  Past Psychiatric History:   Past Medical History:  Past Medical History:  Diagnosis Date  . Blood transfusion without reported diagnosis   . Depression   . Frequent headaches   . GERD (gastroesophageal reflux disease)   . Heart murmur   . Migraine   . UTI (lower urinary tract infection)     Past Surgical History:  Procedure Laterality Date  . ABDOMINAL HYSTERECTOMY  2009   Family History:  Family History  Problem Relation Age of Onset  . Cancer Maternal Grandmother        breast  . Heart disease Maternal Grandfather   . Alcohol abuse Paternal Grandfather   . Dementia Paternal Grandmother    Family Psychiatric  History:  Social History:  Social History   Substance and Sexual Activity  Alcohol Use Yes  . Alcohol/week: 0.6 oz  . Types: 1 Glasses of wine per week     Social History   Substance and Sexual Activity  Drug Use No    Social History   Socioeconomic History  . Marital status: Married    Spouse name: None  . Number of children: None  . Years of education: None  . Highest education level: None  Social Needs  . Financial resource strain: None  . Food insecurity - worry:  None  . Food insecurity - inability: None  . Transportation needs - medical: None  . Transportation needs - non-medical: None  Occupational History  . None  Tobacco Use  . Smoking status: Never Smoker  . Smokeless tobacco: Never Used  Substance and Sexual Activity  . Alcohol use: Yes    Alcohol/week: 0.6 oz    Types: 1 Glasses of wine per week  . Drug use: No  . Sexual activity: Yes    Partners: Male    Birth control/protection: Surgical  Other Topics Concern  . None  Social History Narrative   . None   Additional Social History:                         Sleep: Poor  Appetite:  Good  Current Medications: Current Outpatient Medications  Medication Sig Dispense Refill  . citalopram (CELEXA) 40 MG tablet Take 40 mg by mouth daily.     . temazepam (RESTORIL) 30 MG capsule Take 1 capsule (30 mg total) by mouth at bedtime as needed for sleep. 30 capsule 5  . sertraline (ZOLOFT) 100 MG tablet Take 1 tablet (100 mg total) by mouth daily. (Patient not taking: Reported on 06/28/2017) 90 tablet 2   No current facility-administered medications for this visit.     Lab Results: No results found for this or any previous visit (from the past 48 hour(s)).  Blood Alcohol level:  No results found for: [facility]  Physical Findings: AIMS:  , ,  ,  ,    CIWA:    COWS:     Musculoskeletal: Strength & Muscle Tone: within normal limits Gait & Station Patient leans: N/A  Psychiatric Specialty Exam: ROS  Blood pressure 118/76, pulse 78, height 5' 4"  (1.626 m), weight 188 lb (85.3 kg), SpO2 98 %.Body mass index is 32.27 kg/m.  General Appearance: Fairly Groomed  Engineer, water::  Good  Speech:  Clear and Coherent  Volume:  Normal  Mood:  Euthymic  Affect:  Appropriate  Thought Process:  Coherent  Orientation:  Full (Time, Place, and Person)  Thought Content:  WDL  Suicidal Thoughts:  No  Homicidal Thoughts:  No  Memory:  NA  Judgement:  Good  Insight:  Fair  Psychomotor Activity:  Normal  Concentration:  Good  Recall:  Good  Fund of Knowledge:Good  Language: Good  Akathisia:  No  Handed:  Right  AIMS (if indicated):     Assets:  Financial Resources/Insurance  ADL's:  Intact  Cognition: WNL  Sleep:      Treatment Plan Summary: At this time the patient will continue taking Zoloft 100 mg. Is not affecting her sex drive and I do not think is affecting her weight. She claims that seems to feel that is working well. Now she sleeping fairly well taking Restoril 15 mg 2 at  night. The patient will stay in therapy. The patient was given her for all to see Dr. Emi Belfast at [place]. This patient return to see me in 5 months.  Jerral Ralph, MD 06/28/2017, 2:06 PM Patient ID: Awilda Metro, female   DOB: [birth year], 42 y.o.   MRN: [id number] [facility] MD Progress Note  06/28/2017 2:06 PM Yolanda Wolfe  MRN:  [id number] Subjective:  Spirits are good Principal Problem: Major depression, recurrent episode Diagnosis:  Major depression, recurrent episode + Total Time spent with patient: 15 minutes  Past Psychiatric History:   Past Medical History:  Past Medical History:  Diagnosis Date  . Blood transfusion without reported diagnosis   . Depression   . Frequent headaches   . GERD (gastroesophageal reflux disease)   . Heart murmur   . Migraine   . UTI (lower urinary tract infection)     Past Surgical History:  Procedure Laterality Date  . ABDOMINAL HYSTERECTOMY  2009   Family History:  Family History  Problem Relation Age of Onset  . Cancer Maternal Grandmother        breast  . Heart disease Maternal Grandfather   . Alcohol abuse Paternal Grandfather   . Dementia Paternal Grandmother    Family Psychiatric  History:  Social History:  Social History   Substance and Sexual Activity  Alcohol Use Yes  . Alcohol/week: 0.6 oz  . Types: 1 Glasses of wine per week     Social History   Substance and Sexual Activity  Drug Use No    Social History   Socioeconomic History  . Marital status: Married    Spouse name: None  . Number of children: None  . Years of education: None  . Highest education level: None  Social Needs  . Financial resource strain: None  . Food insecurity - worry: None  . Food insecurity - inability: None  . Transportation needs - medical: None  . Transportation needs - non-medical: None  Occupational History  . None  Tobacco Use  . Smoking status: Never Smoker  . Smokeless tobacco: Never Used  Substance and  Sexual Activity  . Alcohol use: Yes    Alcohol/week: 0.6 oz    Types: 1 Glasses of wine per week  . Drug use: No  . Sexual activity: Yes    Partners: Male    Birth control/protection: Surgical  Other Topics Concern  . None  Social History Narrative  . None   Additional Social History:                         Sleep: Poor  Appetite:  Good  Current Medications: Current Outpatient Medications  Medication Sig Dispense Refill  . citalopram (CELEXA) 40 MG tablet Take 40 mg by mouth daily.     . temazepam (RESTORIL) 30 MG capsule Take 1 capsule (30 mg total) by mouth at bedtime as needed for sleep. 30 capsule 5  . sertraline (ZOLOFT) 100 MG tablet Take 1 tablet (100 mg total) by mouth daily. (Patient not taking: Reported on 06/28/2017) 90 tablet 2   No current facility-administered medications for this visit.     Lab Results: No results found for this or any previous visit (from the past 48 hour(s)).  Blood Alcohol level:  No results found for: [facility]  Physical Findings: AIMS:  , ,  ,  ,    CIWA:    COWS:     Musculoskeletal: Strength & Muscle Tone: within normal limits Gait & Station Patient leans: N/A  Psychiatric Specialty Exam: ROS  Blood pressure 118/76, pulse 78, height 5' 4"  (1.626 m), weight 188 lb (85.3 kg), SpO2 98 %.Body mass index is 32.27 kg/m.  General Appearance: Fairly Groomed  Engineer, water::  Good  Speech:  Clear and Coherent  Volume:  Normal  Mood:  Euthymic  Affect:  Appropriate  Thought Process:  Coherent  Orientation:  Full (Time, Place, and Person)  Thought Content:  WDL  Suicidal Thoughts:  No  Homicidal Thoughts:  No  Memory:  NA  Judgement:  Good  Insight:  Fair  Psychomotor Activity:  Normal  Concentration:  Good  Recall:  Good  Fund of Knowledge:Good  Language: Good  Akathisia:  No  Handed:  Right  AIMS (if indicated):     Assets:  Financial Resources/Insurance  ADL's:  Intact  Cognition: WNL  Sleep:      Treatment  Plan Summary  At this time for ask her to continue taking the Zoloft but just to take 50 mg for 6 days and then to discontinue it. She'll continue taking Restoril 30 mg every night. I shared with her that I would continue her sleeping aid irregardless whether or not she is taking Zoloft. At one point perhaps her primary care doctor could prescribe but I think a good night sleep is very important. Hopefully Dr. Maxwell Caul will help treat the specific symptoms and even her Restoril may be removed in the future. The patient was instructed to come back in 3 months. She was told that if in fact she started feeling depressed persistent way for more than a week that she should call and we should restart her Zoloft. The patient is not suicidal. She is functioning very well. She works full time. Financially she is stable.

## 2017-07-17 ENCOUNTER — Telehealth (HOSPITAL_COMMUNITY): Payer: Self-pay

## 2017-07-17 NOTE — Telephone Encounter (Signed)
Patient called in about her Zoloft - she has been on this for 9 years and at her last appointment you wanted her to taper and discontinue. Patient did this, but symptoms of depression and insomnia are coming back. She would like to continue the Zoloft for now at 50 mg. Please review and advise, thank you

## 2017-07-19 MED ORDER — SERTRALINE HCL 100 MG PO TABS: 100.0000 mg | ORAL_TABLET | Freq: Every day | ORAL | 0 refills | Status: DC

## 2017-08-11 NOTE — Telephone Encounter (Signed)
Yes  Go back to Zoloft at the full dose

## 2017-08-16 ENCOUNTER — Ambulatory Visit: Payer: Commercial Managed Care - PPO | Admitting: Family Medicine

## 2017-08-16 ENCOUNTER — Encounter: Payer: Self-pay | Admitting: Family Medicine

## 2017-08-16 VITALS — BP 120/74 | HR 85 | Temp 98.6°F | Resp 12 | Ht 64.0 in | Wt 192.1 lb

## 2017-08-16 DIAGNOSIS — J309 Allergic rhinitis, unspecified: Secondary | ICD-10-CM

## 2017-08-16 DIAGNOSIS — J069 Acute upper respiratory infection, unspecified: Secondary | ICD-10-CM

## 2017-08-16 DIAGNOSIS — E782 Mixed hyperlipidemia: Secondary | ICD-10-CM | POA: Diagnosis not present

## 2017-08-16 DIAGNOSIS — Z6832 Body mass index (BMI) 32.0-32.9, adult: Secondary | ICD-10-CM

## 2017-08-16 DIAGNOSIS — E6609 Other obesity due to excess calories: Secondary | ICD-10-CM | POA: Diagnosis not present

## 2017-08-16 DIAGNOSIS — E669 Obesity, unspecified: Secondary | ICD-10-CM | POA: Insufficient documentation

## 2017-08-16 MED ORDER — FLUTICASONE PROPIONATE 50 MCG/ACT NA SUSP: 1.0000 | Freq: Two times a day (BID) | NASAL | 3 refills | Status: DC

## 2017-08-16 MED ORDER — BENZONATATE 100 MG PO CAPS: 200.0000 mg | ORAL_CAPSULE | Freq: Two times a day (BID) | ORAL | 0 refills | Status: AC | PRN

## 2017-08-16 NOTE — Patient Instructions (Addendum)
A few things to remember from today's visit:   URI, acute - Plan: benzonatate (TESSALON) 100 MG capsule  Allergic rhinitis, unspecified seasonality, unspecified trigger - Plan: fluticasone (FLONASE) 50 MCG/ACT nasal spray  Hyperlipidemia, mixed - Plan: Basic metabolic panel, Lipid panel  viral infections are self-limited and we treat each symptom depending of severity.  Over the counter medications as decongestants and cold medications usually help, they need to be taken with caution if there is a history of high blood pressure or palpitations. Tylenol and/or Ibuprofen also helps with most symptoms (headache, muscle aching, fever,etc) Plenty of fluids. Honey helps with cough. Steam inhalations helps with runny nose, nasal congestion, and may prevent sinus infections. Cough and nasal congestion could last a few days and sometimes weeks. Please follow in not any better in 1-2 weeks or if symptoms get worse.  Please be sure medication list is accurate. If a new problem present, please set up appointment sooner than planned today.   There are 2 forms of allergic rhinitis: . Seasonal (hay fever): Caused by an allergy to pollen and/or mold spores in the air. Pollen is the fine powder that comes from the stamen of flowering plants. It can be carried through the air and is easily inhaled. Symptoms are seasonal and usually occur in spring, late summer, and fall. Marland Kitchen Perennial: Caused by other allergens such as dust mites, pet hair or dander, or mold. Symptoms occur year-round.  Symptoms: Your symptoms can vary, depending on the severity of your allergies. Symptoms can include: Sneezing, coughing.itching (mostly eyes, nose, mouth, throat and skin),runny nose,stuffy nose.headache,pressure in the nose and cheeks,ear fullness and popping, sore throat.watery, red, or swollen eyes,dark circles under your eyes,trouble smelling, and sometimes hives.  Allergic rhinitis cannot be prevented. You can help your  symptoms by avoiding the things that you are allergic, including: . Keeping windows closed. This is especially important during high-pollen seasons. . Washing your hands after petting animals. . Using dust- and mite-proof bedding and mattress covers. . Wearing glasses outside to protect your eyes. . Showering before bed to wash off allergens from hair and skin. You can also avoid things that can make your symptoms worse, such as: . aerosol sprays . air pollution . cold temperatures . humidity . irritating fumes . tobacco smoke . wind . wood smoke.   Antihistamines help reduce the sneezing, runny nose, and itchiness of allergies. These come in pill form and as nasal sprays. Allegra,Zyrtec,or Claritin are some examples. Decongestants, such as pseudoephedrine and phenylephrine, help temporarily relieve the stuffy nose of allergies. Decongestants are found in many medicines and come as pills, nose sprays, and nose drops. They could increase heart rate and cause tachycardia and tremor. Nasal Afrin should not be used for more than 3 days because you can become dependent on them. This causes you to feel even more stopped-up when you try to quit using them.  Nasal sprays: steroids or antihistaminics. Over the counter intranasal sterids: Nasocort,Rhinocort,or Flonase.You won't notice their benefits for up to 2 weeks after starting them. Allergy shots or sublingual tablets when other treatment do not help.This is done by immunologists.

## 2017-08-16 NOTE — Assessment & Plan Note (Signed)
Nonpharmacologic treatment recommended for now. Further recommendations will be given according to lipid panel results.   She is coming next week for fasting labs.

## 2017-08-16 NOTE — Progress Notes (Signed)
HPI:  Chief Complaint  Patient presents with  . Establish Care    has cold sx     [name] is a 42 y.o. female, who is here today to establish care.  Former PCP: N/A Last preventive routine visit: 2015. She follows with gynecologist periodically, last visit 1-2 years ago.  Chronic medical problems: Depression,HLD, "sleep issues",headaches,anxiety, and allergies among some.  She sees DR Maxwell Caul for insomnia.  She follows with psychiatrist, Dr Casimiro Needle   Concerns today: Lab work and wants to know what she needs to "be checked " for her age.  HLD: On non pharmacologic treatment.  Lab Results  Component Value Date   CHOL 224 (H) 06/19/2014   HDL 34.30 (L) 06/19/2014   LDLDIRECT 154.8 06/19/2014   TRIG 202.0 (H) 06/19/2014   CHOLHDL 7 06/19/2014   She has not been consistent with low fat/healthy diet.   She does not exercises regularly.   Respiratory symptoms  4 days of "a little" productive cough, nasal congestion,clear rhinorrhea,and postnasal drainage.  Frontal pressure headache and mild sore throat. Mild body aches. She has not noted fever, chills, dyspnea,or wheezing. Ear fullness sensation bilateral. No earache or hearing loss.  Co-workers have been sick. No recent travel.  She has taken Nyquil and Dayquil.  Hx of allergic rhinitis,she does not take OTC treatment.   Review of Systems  Constitutional: Positive for fatigue. Negative for activity change, appetite change, fever and unexpected weight change.  HENT: Positive for congestion, postnasal drip, rhinorrhea, sinus pressure, sneezing and sore throat. Negative for mouth sores, nosebleeds and trouble swallowing.   Eyes: Negative for redness and visual disturbance.  Respiratory: Positive for cough. Negative for shortness of breath and wheezing.   Cardiovascular: Negative for chest pain, palpitations and leg swelling.  Gastrointestinal: Negative for abdominal pain, nausea and  vomiting.       Negative for changes in bowel habits.  Genitourinary: Negative for decreased urine volume and hematuria.  Musculoskeletal: Positive for myalgias. Negative for gait problem.  Skin: Negative for rash.  Neurological: Positive for headaches. Negative for syncope and weakness.  Hematological: Negative for adenopathy. Does not bruise/bleed easily.  Psychiatric/Behavioral: Positive for sleep disturbance. The patient is nervous/anxious.       Current Outpatient Medications on File Prior to Visit  Medication Sig Dispense Refill  . hydroquinone 4 % cream Apply topically.    . sertraline (ZOLOFT) 50 MG tablet Take 50 mg by mouth daily.    . tazarotene (AVAGE) 0.1 % cream Apply topically at bedtime.    . temazepam (RESTORIL) 30 MG capsule Take 1 capsule (30 mg total) by mouth at bedtime as needed for sleep. 30 capsule 5  . citalopram (CELEXA) 40 MG tablet Take 40 mg by mouth daily.      No current facility-administered medications on file prior to visit.      Past Medical History:  Diagnosis Date  . Anxiety   . Blood transfusion without reported diagnosis   . Depression   . Frequent headaches   . GERD (gastroesophageal reflux disease)   . Heart murmur   . Migraine   . UTI (lower urinary tract infection)    No Known Allergies  Family History  Problem Relation Age of Onset  . Cancer Maternal Grandmother        breast  . Heart disease Maternal Grandfather   . Alcohol abuse Paternal Grandfather   . Dementia Paternal Grandmother   . Cancer Mother   .  Cancer Father     Social History   Socioeconomic History  . Marital status: Married    Spouse name: None  . Number of children: 1  . Years of education: None  . Highest education level: None  Social Needs  . Financial resource strain: None  . Food insecurity - worry: None  . Food insecurity - inability: None  . Transportation needs - medical: None  . Transportation needs - non-medical: None  Occupational  History  . None  Tobacco Use  . Smoking status: Never Smoker  . Smokeless tobacco: Never Used  Substance and Sexual Activity  . Alcohol use: Yes    Alcohol/week: 0.6 oz    Types: 1 Glasses of wine per week  . Drug use: No  . Sexual activity: Yes    Partners: Male    Birth control/protection: Surgical  Other Topics Concern  . None  Social History Narrative  . None    Vitals:   08/16/17 1357  BP: 120/74  Pulse: 85  Resp: 12  Temp: 98.6 F (37 C)  SpO2: 97%    Body mass index is 32.98 kg/m.   Physical Exam  Nursing note and vitals reviewed. Constitutional: She is oriented to person, place, and time. She appears well-developed. No distress.  HENT:  Head: Normocephalic and atraumatic.  Right Ear: Tympanic membrane, external ear and ear canal normal.  Left Ear: Tympanic membrane, external ear and ear canal normal.  Mouth/Throat: Oropharynx is clear and moist and mucous membranes are normal.  Hypertrophic turbinates.   Postnasal drainage.   Eyes: Conjunctivae are normal. Pupils are equal, round, and reactive to light.  Cardiovascular: Normal rate and regular rhythm.  No murmur heard. Pulses:      Dorsalis pedis pulses are 2+ on the right side, and 2+ on the left side.  Respiratory: Effort normal and breath sounds normal. No respiratory distress.  GI: Soft. She exhibits no mass. There is no hepatomegaly. There is no tenderness.  Musculoskeletal: She exhibits no edema.  Lymphadenopathy:    She has cervical adenopathy.       Right cervical: Posterior cervical adenopathy present.       Left cervical: Posterior cervical adenopathy present.  Neurological: She is alert and oriented to person, place, and time. She has normal strength. Coordination normal.  Skin: Skin is warm. No erythema.  Psychiatric: She has a normal mood and affect.  Well groomed, good eye contact.      ASSESSMENT AND PLAN:   Yolanda Wolfe was seen today for establish care.  Orders Placed This  Encounter  Procedures  . Basic metabolic panel  . Lipid panel    URI, acute  Symptoms suggests a viral etiology,symptomatic treatment recommended, I do not think abx is needed at this time. Instructed to monitor for signs of complications, including new onset of fever among some, instructed about warning signs. I also explained that cough and nasal congestion can last a few days and sometimes weeks. F/U as needed.  -     benzonatate (TESSALON) 100 MG capsule; Take 2 capsules (200 mg total) by mouth 2 (two) times daily as needed for up to 10 days.   Allergic rhinitis Flonase nasal spray daily as needed. Nasal irrigations with saline as needed. OTC antihistaminic my also help, some side effects discussed. Follow-up as needed.  Class 1 obesity with body mass index (BMI) of 32.0 to 32.9 in adult We discussed benefits of wt loss as well as adverse effects of obesity.  Consistency with healthy diet and physical activity recommended.    Hyperlipidemia, mixed Nonpharmacologic treatment recommended for now. Further recommendations will be given according to lipid panel results.   She is coming next week for fasting labs.      Betty G. Martinique, MD  [facility]. [place] office.

## 2017-08-16 NOTE — Assessment & Plan Note (Signed)
We discussed benefits of wt loss as well as adverse effects of obesity. Consistency with healthy diet and physical activity recommended.  

## 2017-08-16 NOTE — Assessment & Plan Note (Signed)
Flonase nasal spray daily as needed. Nasal irrigations with saline as needed. OTC antihistaminic my also help, some side effects discussed. Follow-up as needed.

## 2017-08-22 ENCOUNTER — Other Ambulatory Visit (INDEPENDENT_AMBULATORY_CARE_PROVIDER_SITE_OTHER): Payer: Commercial Managed Care - PPO

## 2017-08-22 DIAGNOSIS — E782 Mixed hyperlipidemia: Secondary | ICD-10-CM

## 2017-08-22 LAB — BASIC METABOLIC PANEL
BUN: 11 mg/dL (ref 6–23)
CO2: 29 mEq/L (ref 19–32)
Calcium: 9.2 mg/dL (ref 8.4–10.5)
Chloride: 102 mEq/L (ref 96–112)
Creatinine, Ser: 0.62 mg/dL (ref 0.40–1.20)
GFR: 113.3 mL/min (ref >60.00)
GLUCOSE: 80 mg/dL (ref 70–99)
POTASSIUM: 4.1 meq/L (ref 3.5–5.1)
SODIUM: 137 meq/L (ref 135–145)

## 2017-08-22 LAB — LIPID PANEL
CHOL/HDL RATIO: 7
Cholesterol: 199 mg/dL (ref 0–200)
HDL: 30.5 mg/dL — ABNORMAL LOW (ref >39.00)
NONHDL: 168.09
Triglycerides: 220 mg/dL — ABNORMAL HIGH (ref 0.0–149.0)
VLDL: 44 mg/dL — ABNORMAL HIGH (ref 0.0–40.0)

## 2017-08-22 LAB — LDL CHOLESTEROL, DIRECT: Direct LDL: 135 mg/dL

## 2017-08-29 ENCOUNTER — Ambulatory Visit: Payer: Commercial Managed Care - PPO | Admitting: Family Medicine

## 2017-08-29 ENCOUNTER — Encounter: Payer: Self-pay | Admitting: Family Medicine

## 2017-08-29 VITALS — BP 118/82 | HR 74 | Temp 98.0°F | Resp 12 | Ht 64.0 in | Wt 191.1 lb

## 2017-08-29 DIAGNOSIS — J309 Allergic rhinitis, unspecified: Secondary | ICD-10-CM

## 2017-08-29 DIAGNOSIS — R432 Parageusia: Secondary | ICD-10-CM | POA: Diagnosis not present

## 2017-08-29 MED ORDER — IPRATROPIUM BROMIDE 0.06 % NA SOLN: 2.0000 | Freq: Four times a day (QID) | NASAL | 3 refills | Status: DC

## 2017-08-29 NOTE — Progress Notes (Signed)
ACUTE VISIT   HPI:  Chief Complaint  Patient presents with  . Tongue issue    feels like something is on  tongue causing food to be bland. Started about 1 week ago    [name] is a 42 y.o. female, who is here today complaining of a week of whitish tongue and "bad taste",sour taste. She cannot taste food.  Intermittent. She has not identified exacerbating or alleviating factors. She denies fever,chills,earache,tinnitus,hearing loss, or facial weakness.   Recently she had an URI,most symptoms have resolved except post nasal drainage and nasal congestion. She has not noted tongue lesions or tenderness. No sore throat,dysphagia,or odynophagia.  No heartburn,acid reflux,abdominal pain,nasuea,or vomiting.   Review of Systems  Constitutional: Negative for appetite change, chills, fatigue and fever.  HENT: Positive for congestion, postnasal drip and rhinorrhea. Negative for dental problem, facial swelling, sinus pain and sore throat.   Respiratory: Negative for cough, shortness of breath and wheezing.   Gastrointestinal: Negative for abdominal pain, nausea and vomiting.  Endocrine: Negative for polydipsia, polyphagia and polyuria.  Skin: Negative for rash.  Allergic/Immunologic: Positive for environmental allergies.  Neurological: Negative for weakness and headaches.  Psychiatric/Behavioral: Negative for confusion. The patient is nervous/anxious.       Current Outpatient Medications on File Prior to Visit  Medication Sig Dispense Refill  . fluticasone (FLONASE) 50 MCG/ACT nasal spray Place 1 spray into both nostrils 2 (two) times daily. 16 g 3  . hydroquinone 4 % cream Apply topically.    . sertraline (ZOLOFT) 50 MG tablet Take 50 mg by mouth daily.    . tazarotene (AVAGE) 0.1 % cream Apply topically at bedtime.    . temazepam (RESTORIL) 30 MG capsule Take 1 capsule (30 mg total) by mouth at bedtime as needed for sleep. 30 capsule 5  . citalopram  (CELEXA) 40 MG tablet Take 40 mg by mouth daily.      No current facility-administered medications on file prior to visit.      Past Medical History:  Diagnosis Date  . Anxiety   . Blood transfusion without reported diagnosis   . Depression   . Frequent headaches   . GERD (gastroesophageal reflux disease)   . Heart murmur   . Migraine   . UTI (lower urinary tract infection)    No Known Allergies  Social History   Socioeconomic History  . Marital status: Divorced    Spouse name: None  . Number of children: 1  . Years of education: None  . Highest education level: None  Social Needs  . Financial resource strain: None  . Food insecurity - worry: None  . Food insecurity - inability: None  . Transportation needs - medical: None  . Transportation needs - non-medical: None  Occupational History  . None  Tobacco Use  . Smoking status: Never Smoker  . Smokeless tobacco: Never Used  Substance and Sexual Activity  . Alcohol use: Yes    Alcohol/week: 0.6 oz    Types: 1 Glasses of wine per week  . Drug use: No  . Sexual activity: Yes    Partners: Male    Birth control/protection: Surgical  Other Topics Concern  . None  Social History Narrative  . None    Vitals:   08/29/17 1111  BP: 118/82  Pulse: 74  Resp: 12  Temp: 98 F (36.7 C)  SpO2: 98%   Body mass index is 32.81 kg/m.   Physical Exam  Nursing note  and vitals reviewed. Constitutional: She is oriented to person, place, and time. She appears well-developed. She does not appear ill. No distress.  HENT:  Head: Normocephalic and atraumatic.  Nose: Rhinorrhea present. Right sinus exhibits no maxillary sinus tenderness and no frontal sinus tenderness. Left sinus exhibits no maxillary sinus tenderness and no frontal sinus tenderness.  Mouth/Throat: Uvula is midline and oropharynx is clear and moist. Mucous membranes are dry (Mild). No oral lesions.  Hypertrophic turbinates. Postnasal drainage.  Eyes:  Conjunctivae are normal.  Cardiovascular: Normal rate and regular rhythm.  No murmur heard. Respiratory: Effort normal and breath sounds normal. No respiratory distress.  Lymphadenopathy:       Head (right side): No submandibular, no preauricular and no posterior auricular adenopathy present.       Head (left side): No submandibular, no preauricular and no posterior auricular adenopathy present.    She has cervical adenopathy.       Left cervical: Posterior cervical adenopathy present.  Neurological: She is alert and oriented to person, place, and time. She has normal strength. No cranial nerve deficit. Gait normal.  Skin: Skin is warm. No rash noted. No erythema.  Psychiatric: Her mood appears anxious.  Well groomed, good eye contact.     ASSESSMENT AND PLAN:   [name] was seen today for tongue issue.  Diagnoses and all orders for this visit:  Allergic rhinitis, unspecified seasonality, unspecified trigger  Nasal irrigations with saline. Atrovent nasal spray daily. OTC antihistaminic may help,some side effects discussed.  -     ipratropium (ATROVENT) 0.06 % nasal spray; Place 2 sprays into both nostrils 4 (four) times daily.  Taste sense altered  Examination normal. Possible causes: allergies,recent URI, and GERD among some. We can continue monitoring for now. If not better in 3-4 weeks ENT evaluation could be considered.     -[name] was advised to seek immediate medical attention if sudden worsening symptoms or to follow if they persist or if new concerns arise.       Betty G. Martinique, MD  [facility]. [place] office.

## 2017-08-29 NOTE — Patient Instructions (Signed)
A few things to remember from today's visit:   Allergic rhinitis, unspecified seasonality, unspecified trigger - Plan: ipratropium (ATROVENT) 0.06 % nasal spray  Taste sense altered  Flonase nasal spray twice daily, over-the-counter antihistaminic, saline irrigations in the nose as needed, and Atrovent nasal spray.  If problem persist after nasal congestion is better please let me know, we may need ENT evaluation.   Please be sure medication list is accurate. If a new problem present, please set up appointment sooner than planned today.

## 2017-08-30 ENCOUNTER — Ambulatory Visit: Payer: Self-pay | Admitting: Family Medicine

## 2017-09-07 ENCOUNTER — Ambulatory Visit: Payer: Commercial Managed Care - PPO | Admitting: Family Medicine

## 2017-09-07 ENCOUNTER — Ambulatory Visit: Payer: Self-pay | Admitting: *Deleted

## 2017-09-07 ENCOUNTER — Encounter: Payer: Self-pay | Admitting: Family Medicine

## 2017-09-07 VITALS — BP 100/74 | HR 74 | Temp 98.9°F | Ht 64.0 in | Wt 188.6 lb

## 2017-09-07 DIAGNOSIS — J111 Influenza due to unidentified influenza virus with other respiratory manifestations: Secondary | ICD-10-CM

## 2017-09-07 DIAGNOSIS — R69 Illness, unspecified: Principal | ICD-10-CM

## 2017-09-07 LAB — POC INFLUENZA A&B (BINAX/QUICKVUE)
INFLUENZA A, POC: NEGATIVE
INFLUENZA B, POC: NEGATIVE

## 2017-09-07 MED ORDER — BENZONATATE 100 MG PO CAPS: 100.0000 mg | ORAL_CAPSULE | Freq: Three times a day (TID) | ORAL | 0 refills | Status: DC | PRN

## 2017-09-07 NOTE — Progress Notes (Signed)
HPI:  Using dictation device. Unfortunately this device frequently misinterprets words/phrases.  Acute visit for respiratory illness: -started: yesterday -symptoms:nasal congestion, sore throat, cough, body aches, temp 100.6 this morning -denies:fever, SOB, NVD, tooth pain -has tried: nothing -sick contacts/travel/risks: boyfriend son with flu -Hx of: denies asthma, diabetes, immunosuppression ROS: See pertinent positives and negatives per HPI.  Past Medical History:  Diagnosis Date  . Anxiety   . Blood transfusion without reported diagnosis   . Depression   . Frequent headaches   . GERD (gastroesophageal reflux disease)   . Heart murmur   . Migraine   . UTI (lower urinary tract infection)     Past Surgical History:  Procedure Laterality Date  . ABDOMINAL HYSTERECTOMY  2009  . CESAREAN SECTION      Family History  Problem Relation Age of Onset  . Cancer Maternal Grandmother        breast  . Heart disease Maternal Grandfather   . Alcohol abuse Paternal Grandfather   . Dementia Paternal Grandmother   . Cancer Mother   . Cancer Father     Social History   Socioeconomic History  . Marital status: Divorced    Spouse name: Not on file  . Number of children: 1  . Years of education: Not on file  . Highest education level: Not on file  Occupational History  . Not on file  Social Needs  . Financial resource strain: Not on file  . Food insecurity:    Worry: Not on file    Inability: Not on file  . Transportation needs:    Medical: Not on file    Non-medical: Not on file  Tobacco Use  . Smoking status: Never Smoker  . Smokeless tobacco: Never Used  Substance and Sexual Activity  . Alcohol use: Yes    Alcohol/week: 0.6 oz    Types: 1 Glasses of wine per week  . Drug use: No  . Sexual activity: Yes    Partners: Male    Birth control/protection: Surgical  Lifestyle  . Physical activity:    Days per week: Not on file    Minutes per session: Not on file    . Stress: Not on file  Relationships  . Social connections:    Talks on phone: Not on file    Gets together: Not on file    Attends religious service: Not on file    Active member of club or organization: Not on file    Attends meetings of clubs or organizations: Not on file    Relationship status: Not on file  Other Topics Concern  . Not on file  Social History Narrative  . Not on file     Current Outpatient Medications:  .  citalopram (CELEXA) 40 MG tablet, Take 40 mg by mouth daily. , Disp: , Rfl:  .  fluticasone (FLONASE) 50 MCG/ACT nasal spray, Place 1 spray into both nostrils 2 (two) times daily., Disp: 16 g, Rfl: 3 .  hydroquinone 4 % cream, Apply topically., Disp: , Rfl:  .  ipratropium (ATROVENT) 0.06 % nasal spray, Place 2 sprays into both nostrils 4 (four) times daily., Disp: 15 mL, Rfl: 3 .  sertraline (ZOLOFT) 50 MG tablet, Take 50 mg by mouth daily., Disp: , Rfl:  .  tazarotene (AVAGE) 0.1 % cream, Apply topically at bedtime., Disp: , Rfl:  .  temazepam (RESTORIL) 30 MG capsule, Take 1 capsule (30 mg total) by mouth at bedtime as needed for sleep., Disp:  30 capsule, Rfl: 5 .  benzonatate (TESSALON PERLES) 100 MG capsule, Take 1 capsule (100 mg total) by mouth 3 (three) times daily as needed., Disp: 20 capsule, Rfl: 0  EXAM:  Vitals:   09/07/17 1051  BP: 100/74  Pulse: 74  Temp: 98.9 F (37.2 C)  SpO2: 97%    Body mass index is 32.37 kg/m.  GENERAL: vitals reviewed and listed above, alert, oriented, appears well hydrated and in no acute distress  HEENT: atraumatic, conjunttiva clear, no obvious abnormalities on inspection of external nose and ears, normal appearance of ear canals and TMs, clear nasal congestion, mild post oropharyngeal erythema with PND, no tonsillar edema or exudate, no sinus TTP  NECK: no obvious masses on inspection  LUNGS: clear to auscultation bilaterally, no wheezes, rales or rhonchi, good air movement  CV: HRRR, no peripheral  edema  MS: moves all extremities without noticeable abnormality  PSYCH: pleasant and cooperative, no obvious depression or anxiety  ASSESSMENT AND PLAN:  Discussed the following assessment and plan:  Influenza-like illness  We discussed potential/likely etiologies, with influenza being likely or possible vs. viral infection or other. We discussed risks/benefits/indications/best timing for tamiflu, symptomatic care, likely course, transmission, potential complications, signs of developing a serious illness and return and emergency precuations. She declined tamiflu, but does want tessalon.    Patient Instructions  Influenza, Adult Influenza ("the flu") is an infection in the lungs, nose, and throat (respiratory tract). It is caused by a virus. The flu causes many common cold symptoms, as well as a high fever and body aches. It can make you feel very sick. The flu spreads easily from person to person (is contagious). Getting a flu shot (influenza vaccination) every year is the best way to prevent the flu. Follow these instructions at home:  Take over-the-counter and prescription medicines only as told by your doctor.  Use a cool mist humidifier to add moisture (humidity) to the air in your home. This can make it easier to breathe.  Rest as needed.  Drink enough fluid to keep your pee (urine) clear or pale yellow.  Cover your mouth and nose when you cough or sneeze.  Wash your hands with soap and water often, especially after you cough or sneeze. If you cannot use soap and water, use hand sanitizer.  Stay home from work or school as told by your doctor. Unless you are visiting your doctor, try to avoid leaving home until your fever has been gone for 24 hours without the use of medicine.  Keep all follow-up visits as told by your doctor. This is important. How is this prevented?  Getting a yearly (annual) flu shot is the best way to avoid getting the flu. You may get the flu shot in  late summer, fall, or winter. Ask your doctor when you should get your flu shot.  Wash your hands often or use hand sanitizer often.  Avoid contact with people who are sick during cold and flu season.  Eat healthy foods.  Drink plenty of fluids.  Get enough sleep.  Exercise regularly. Contact a doctor if:  You get new symptoms.  You have: ? Chest pain. ? Watery poop (diarrhea). ? A fever.  Your cough gets worse.  You start to have more mucus.  You feel sick to your stomach (nauseous).  You throw up (vomit). Get help right away if:  You start to be short of breath or have trouble breathing.  Your skin or nails turn a bluish  color.  You have very bad pain or stiffness in your neck.  You get a sudden headache.  You get sudden pain in your face or ear.  You cannot stop throwing up. This information is not intended to replace advice given to you by your health care provider. Make sure you discuss any questions you have with your health care provider. Document Released: 03/15/2008 Document Revised: 11/12/2015 Document Reviewed: 03/31/2015 Elsevier Interactive Patient Education  2017 [place], [place]

## 2017-09-07 NOTE — Patient Instructions (Signed)

## 2017-09-07 NOTE — Addendum Note (Signed)
Addended by: Agnes Lawrence on: 09/07/2017 11:27 AM   Modules accepted: Orders

## 2017-09-07 NOTE — Telephone Encounter (Signed)
Noted  

## 2017-09-07 NOTE — Telephone Encounter (Signed)
Pt called with flu like symptoms, cough, runny nose, congested productive cough, low grade fever, body aches and sore throat.  She was exposed to her boyfriend's son and others at a basketball tournament a few days ago. She is requesting Tamiflu.  Per protocol, flow notified. An appointment made for today. Home care advice given with verbal understanding.  Reason for Disposition . [1] Patient is NOT HIGH RISK AND [2] strongly requests antiviral medicine AND [3] flu symptoms present < 48 hours  Answer Assessment - Initial Assessment Questions 1. WORST SYMPTOM: "What is your worst symptom?" (e.g., cough, runny nose, muscle aches, headache, sore throat, fever)      Cough and muscle aches 2. ONSET: "When did your flu symptoms start?"      yesterday 3. COUGH: "How bad is the cough?"       Bad, congested and prod cough 4. RESPIRATORY DISTRESS: "Describe your breathing."      ok 5. FEVER: "Do you have a fever?" If so, ask: "What is your temperature, how was it measured, and when did it start?"     Temp 100.6 6. EXPOSURE: "Were you exposed to someone with influenza?"       Yes, boyfriend's son and others at a basketball tournament 7. FLU VACCINE: "Did you get a flu shot this year?"     yes 8. HIGH RISK DISEASE: "Do you any chronic medical problems?" (e.g., heart or lung disease, asthma, weak immune system, or other HIGH RISK conditions)     no 9. PREGNANCY: "Is there any chance you are pregnant?" "When was your last menstrual period?"     No, lmp no periods, hysterectomy 10. OTHER SYMPTOMS: "Do you have any other symptoms?"  (e.g., runny nose, muscle aches, headache, sore throat)       Runny nose, muscles aches, headache and sore throat  Protocols used: INFLUENZA - SEASONAL-A-AH

## 2017-09-27 ENCOUNTER — Ambulatory Visit (INDEPENDENT_AMBULATORY_CARE_PROVIDER_SITE_OTHER): Payer: Commercial Managed Care - PPO | Admitting: Psychiatry

## 2017-09-27 ENCOUNTER — Encounter (HOSPITAL_COMMUNITY): Payer: Self-pay | Admitting: Psychiatry

## 2017-09-27 VITALS — BP 130/74 | HR 78 | Ht 64.0 in | Wt 189.8 lb

## 2017-09-27 DIAGNOSIS — Z810 Family history of intellectual disabilities: Secondary | ICD-10-CM | POA: Diagnosis not present

## 2017-09-27 DIAGNOSIS — F339 Major depressive disorder, recurrent, unspecified: Secondary | ICD-10-CM

## 2017-09-27 DIAGNOSIS — Z811 Family history of alcohol abuse and dependence: Secondary | ICD-10-CM

## 2017-09-27 DIAGNOSIS — F325 Major depressive disorder, single episode, in full remission: Secondary | ICD-10-CM | POA: Diagnosis not present

## 2017-09-27 MED ORDER — FLUOXETINE HCL 20 MG PO CAPS: 20.0000 mg | ORAL_CAPSULE | Freq: Every day | ORAL | 2 refills | Status: DC

## 2017-09-27 MED ORDER — TEMAZEPAM 30 MG PO CAPS: 30.0000 mg | ORAL_CAPSULE | Freq: Every evening | ORAL | 5 refills | Status: DC | PRN

## 2017-09-27 MED ORDER — SERTRALINE HCL 50 MG PO TABS: 50.0000 mg | ORAL_TABLET | Freq: Every day | ORAL | 2 refills | Status: DC

## 2017-09-27 NOTE — Progress Notes (Signed)
Patient ID: Yolanda Wolfe, female   DOB: May 10, 1976, 42 y.o.   MRN: [id number] [facility] MD Progress Note  09/27/2017 3:13 PM Yolanda Wolfe  MRN:  [id number] Subjective:  Spirits are good Principal Problem: Major depression, recurrent episode  Patient is doing very well. Unfortunately she had a hard time falling off the Zoloft. When she stopped 2-3 days she felt very sick and nauseated she's gone back home is all her requests. At this time she only takes 50 mg. The patient denies being depressed. She sleeping and eating well. She's having a sleep study done tonight and tomorrow she sees Yolanda Wolfe. She's eating well. She likes school. Her daughter is doing great boyfriend Yolanda Wolfe is great. All relationships are good. Financially stable. Generally she is very healthy. She likes her job a great deal. She still dedicated to come off the antidepressant. She takes Restoril 30 mg which helped the patient denies the use of alcohol or drugs. She takes her medications just as prescribed. She is physically very healthy. Patient Active Problem List   Diagnosis Date Noted  . Allergic rhinitis [J30.9] 08/16/2017  . Hyperlipidemia, mixed [E78.2] 08/16/2017  . Class 1 obesity with body mass index (BMI) of 32.0 to 32.9 in adult [E66.9, Z68.32] 08/16/2017  . Major depressive disorder, recurrent episode ([place]) [F33.9] 05/07/2015  . Weight gain [R63.5] 06/19/2014  . Depression [F32.9] 06/19/2014  . Routine general medical examination at a health care facility [Z00.00] 06/19/2014  . Insomnia [G47.00] 06/19/2014   Total Time spent with patient: 15 minutes  Past Psychiatric History:   Past Medical History:  Past Medical History:  Diagnosis Date  . Anxiety   . Blood transfusion without reported diagnosis   . Depression   . Frequent headaches   . GERD (gastroesophageal reflux disease)   . Heart murmur   . Migraine   . UTI (lower urinary tract infection)     Past Surgical History:  Procedure  Laterality Date  . ABDOMINAL HYSTERECTOMY  2009  . CESAREAN SECTION     Family History:  Family History  Problem Relation Age of Onset  . Cancer Maternal Grandmother        breast  . Heart disease Maternal Grandfather   . Alcohol abuse Paternal Grandfather   . Dementia Paternal Grandmother   . Cancer Mother   . Cancer Father    Family Psychiatric  History:  Social History:  Social History   Substance and Sexual Activity  Alcohol Use Yes  . Alcohol/week: 0.6 oz  . Types: 1 Glasses of wine per week     Social History   Substance and Sexual Activity  Drug Use No    Social History   Socioeconomic History  . Marital status: Divorced    Spouse name: Not on file  . Number of children: 1  . Years of education: Not on file  . Highest education level: Not on file  Occupational History  . Not on file  Social Needs  . Financial resource strain: Not on file  . Food insecurity:    Worry: Not on file    Inability: Not on file  . Transportation needs:    Medical: Not on file    Non-medical: Not on file  Tobacco Use  . Smoking status: Never Smoker  . Smokeless tobacco: Never Used  Substance and Sexual Activity  . Alcohol use: Yes    Alcohol/week: 0.6 oz    Types: 1 Glasses of wine per week  . Drug  use: No  . Sexual activity: Yes    Partners: Male    Birth control/protection: Surgical  Lifestyle  . Physical activity:    Days per week: Not on file    Minutes per session: Not on file  . Stress: Not on file  Relationships  . Social connections:    Talks on phone: Not on file    Gets together: Not on file    Attends religious service: Not on file    Active member of club or organization: Not on file    Attends meetings of clubs or organizations: Not on file    Relationship status: Not on file  Other Topics Concern  . Not on file  Social History Narrative  . Not on file   Additional Social History:                         Sleep: Poor  Appetite:   Good  Current Medications: Current Outpatient Medications  Medication Sig Dispense Refill  . sertraline (ZOLOFT) 50 MG tablet Take 1 tablet (50 mg total) by mouth daily. 30 tablet 2  . temazepam (RESTORIL) 30 MG capsule Take 1 capsule (30 mg total) by mouth at bedtime as needed for sleep. 30 capsule 5  . FLUoxetine (PROZAC) 20 MG capsule Take 1 capsule (20 mg total) by mouth daily. 30 capsule 2   No current facility-administered medications for this visit.     Lab Results: No results found for this or any previous visit (from the past 48 hour(s)).  Blood Alcohol level:  No results found for: [facility]  Physical Findings: AIMS:  , ,  ,  ,    CIWA:    COWS:     Musculoskeletal: Strength & Muscle Tone: within normal limits Gait & Station Patient leans: N/A  Psychiatric Specialty Exam: ROS  Blood pressure 130/74, pulse 78, height 5\' 4"  (1.626 m), weight 189 lb 12.8 oz (86.1 kg).Body mass index is 32.58 kg/m.  General Appearance: Fairly Groomed  Engineer, water::  Good  Speech:  Clear and Coherent  Volume:  Normal  Mood:  Euthymic  Affect:  Appropriate  Thought Process:  Coherent  Orientation:  Full (Time, Place, and Person)  Thought Content:  WDL  Suicidal Thoughts:  No  Homicidal Thoughts:  No  Memory:  NA  Judgement:  Good  Insight:  Fair  Psychomotor Activity:  Normal  Concentration:  Good  Recall:  Good  Fund of Knowledge:Good  Language: Good  Akathisia:  No  Handed:  Right  AIMS (if indicated):     Assets:  Financial Resources/Insurance  ADL's:  Intact  Cognition: WNL  Sleep:      Treatment Plan Summary: At this time the patient will continue taking Zoloft 100 mg. Is not affecting her sex drive and I do not think is affecting her weight. She claims that seems to feel that is working well. Now she sleeping fairly well taking Restoril 15 mg 2 at night. The patient will stay in therapy. The patient was given her for all to see Yolanda Wolfe at [place].  This patient return to see me in 5 months.  Yolanda Ralph, MD 09/27/2017, 3:13 PM Patient ID: Yolanda Wolfe, female   DOB: 27-Dec-1975, 42 y.o.   MRN: [id number] [facility] MD Progress Note  09/27/2017 3:13 PM Yolanda Wolfe  MRN:  [id number] Subjective:  Spirits are good Principal Problem: Major depression, recurrent episode Diagnosis:  Major  depression, recurrent episode + Total Time spent with patient: 15 minutes  Past Psychiatric History:   Past Medical History:  Past Medical History:  Diagnosis Date  . Anxiety   . Blood transfusion without reported diagnosis   . Depression   . Frequent headaches   . GERD (gastroesophageal reflux disease)   . Heart murmur   . Migraine   . UTI (lower urinary tract infection)     Past Surgical History:  Procedure Laterality Date  . ABDOMINAL HYSTERECTOMY  2009  . CESAREAN SECTION     Family History:  Family History  Problem Relation Age of Onset  . Cancer Maternal Grandmother        breast  . Heart disease Maternal Grandfather   . Alcohol abuse Paternal Grandfather   . Dementia Paternal Grandmother   . Cancer Mother   . Cancer Father    Family Psychiatric  History:  Social History:  Social History   Substance and Sexual Activity  Alcohol Use Yes  . Alcohol/week: 0.6 oz  . Types: 1 Glasses of wine per week     Social History   Substance and Sexual Activity  Drug Use No    Social History   Socioeconomic History  . Marital status: Divorced    Spouse name: Not on file  . Number of children: 1  . Years of education: Not on file  . Highest education level: Not on file  Occupational History  . Not on file  Social Needs  . Financial resource strain: Not on file  . Food insecurity:    Worry: Not on file    Inability: Not on file  . Transportation needs:    Medical: Not on file    Non-medical: Not on file  Tobacco Use  . Smoking status: Never Smoker  . Smokeless tobacco: Never Used  Substance and  Sexual Activity  . Alcohol use: Yes    Alcohol/week: 0.6 oz    Types: 1 Glasses of wine per week  . Drug use: No  . Sexual activity: Yes    Partners: Male    Birth control/protection: Surgical  Lifestyle  . Physical activity:    Days per week: Not on file    Minutes per session: Not on file  . Stress: Not on file  Relationships  . Social connections:    Talks on phone: Not on file    Gets together: Not on file    Attends religious service: Not on file    Active member of club or organization: Not on file    Attends meetings of clubs or organizations: Not on file    Relationship status: Not on file  Other Topics Concern  . Not on file  Social History Narrative  . Not on file   Additional Social History:                         Sleep: Poor  Appetite:  Good  Current Medications: Current Outpatient Medications  Medication Sig Dispense Refill  . sertraline (ZOLOFT) 50 MG tablet Take 1 tablet (50 mg total) by mouth daily. 30 tablet 2  . temazepam (RESTORIL) 30 MG capsule Take 1 capsule (30 mg total) by mouth at bedtime as needed for sleep. 30 capsule 5  . FLUoxetine (PROZAC) 20 MG capsule Take 1 capsule (20 mg total) by mouth daily. 30 capsule 2   No current facility-administered medications for this visit.     Lab Results: No  results found for this or any previous visit (from the past 11 hour(s)).  Blood Alcohol level:  No results found for: [facility]  Physical Findings: AIMS:  , ,  ,  ,    CIWA:    COWS:     Musculoskeletal: Strength & Muscle Tone: within normal limits Gait & Station Patient leans: N/A  Psychiatric Specialty Exam: ROS  Blood pressure 130/74, pulse 78, height 5\' 4"  (1.626 m), weight 189 lb 12.8 oz (86.1 kg).Body mass index is 32.58 kg/m.  General Appearance: Fairly Groomed  Engineer, water::  Good  Speech:  Clear and Coherent  Volume:  Normal  Mood:  Euthymic  Affect:  Appropriate  Thought Process:  Coherent  Orientation:  Full (Time,  Place, and Person)  Thought Content:  WDL  Suicidal Thoughts:  No  Homicidal Thoughts:  No  Memory:  NA  Judgement:  Good  Insight:  Fair  Psychomotor Activity:  Normal  Concentration:  Good  Recall:  Good  Fund of Knowledge:Good  Language: Good  Akathisia:  No  Handed:  Right  AIMS (if indicated):     Assets:  Financial Resources/Insurance  ADL's:  Intact  Cognition: WNL  Sleep:      Treatment Plan Summary  At this time we'll still plan to have her come off antidepressants. She'll continue taking Zoloft 50 mg a day and she'll have Prozac 20 mg. In one month she'll discontinue the Zoloft continue on the Prozac for 1 more month after that. A month later which is approximately 3 months from now she will return to see me taking only Restoril for sleep. Hopefully at that point she'll feel all her antidepressants she was told at any point she started feeling sick for longer than 2 or 3 days she should contact us and likely the back to the previous antidepressants. My hope is that we'll switch the Prozac with Zoloft and after a month of being on Prozac she'll discontinue that as well. Social both medications for proximal 1 month while Prozac her system. The patient is not suicidal. She is functioning extremely well. She'll return to see me in 3 months for a 15 minute visit.

## 2017-09-28 DIAGNOSIS — G4710 Hypersomnia, unspecified: Secondary | ICD-10-CM | POA: Diagnosis not present

## 2017-10-21 ENCOUNTER — Telehealth (HOSPITAL_COMMUNITY): Payer: Self-pay

## 2017-10-21 ENCOUNTER — Encounter (HOSPITAL_COMMUNITY): Payer: Self-pay | Admitting: Emergency Medicine

## 2017-10-21 ENCOUNTER — Other Ambulatory Visit: Payer: Self-pay

## 2017-10-21 ENCOUNTER — Emergency Department (HOSPITAL_COMMUNITY): Payer: Commercial Managed Care - PPO

## 2017-10-21 ENCOUNTER — Emergency Department (HOSPITAL_COMMUNITY)
Admission: EM | Admit: 2017-10-21 | Discharge: 2017-10-21 | Disposition: A | Discharge status: Home / Self Care | Payer: Commercial Managed Care - PPO | Attending: Emergency Medicine | Admitting: Emergency Medicine

## 2017-10-21 DIAGNOSIS — Z79899 Other long term (current) drug therapy: Secondary | ICD-10-CM | POA: Insufficient documentation

## 2017-10-21 DIAGNOSIS — F339 Major depressive disorder, recurrent, unspecified: Secondary | ICD-10-CM

## 2017-10-21 DIAGNOSIS — R1032 Left lower quadrant pain: Secondary | ICD-10-CM | POA: Diagnosis not present

## 2017-10-21 DIAGNOSIS — N132 Hydronephrosis with renal and ureteral calculous obstruction: Secondary | ICD-10-CM | POA: Diagnosis not present

## 2017-10-21 DIAGNOSIS — N200 Calculus of kidney: Secondary | ICD-10-CM | POA: Diagnosis not present

## 2017-10-21 DIAGNOSIS — R109 Unspecified abdominal pain: Secondary | ICD-10-CM | POA: Diagnosis present

## 2017-10-21 LAB — CBC
HCT: 39.2 % (ref 36.0–46.0)
HEMOGLOBIN: 12.8 g/dL (ref 12.0–15.0)
MCH: 28.3 pg (ref 26.0–34.0)
MCHC: 32.7 g/dL (ref 30.0–36.0)
MCV: 86.5 fL (ref 78.0–100.0)
Platelets: 329 10*3/uL (ref 150–400)
RBC: 4.53 MIL/uL (ref 3.87–5.11)
RDW: 13.4 % (ref 11.5–15.5)
WBC: 18.5 10*3/uL — AB (ref 4.0–10.5)

## 2017-10-21 LAB — URINALYSIS, ROUTINE W REFLEX MICROSCOPIC
Bilirubin Urine: NEGATIVE
Glucose, UA: NEGATIVE mg/dL
Ketones, ur: 5 mg/dL — AB
Leukocytes, UA: NEGATIVE
Nitrite: NEGATIVE
Protein, ur: NEGATIVE mg/dL
Specific Gravity, Urine: 1.015 (ref 1.005–1.030)
pH: 9 — ABNORMAL HIGH (ref 5.0–8.0)

## 2017-10-21 LAB — BASIC METABOLIC PANEL
Anion gap: 10 (ref 5–15)
BUN: 13 mg/dL (ref 6–20)
CHLORIDE: 103 mmol/L (ref 101–111)
CO2: 24 mmol/L (ref 22–32)
Calcium: 9.1 mg/dL (ref 8.9–10.3)
Creatinine, Ser: 0.88 mg/dL (ref 0.44–1.00)
GFR calc Af Amer: >60 mL/min (ref >60)
GFR calc non Af Amer: >60 mL/min (ref >60)
Glucose, Bld: 147 mg/dL — ABNORMAL HIGH (ref 65–99)
POTASSIUM: 4.3 mmol/L (ref 3.5–5.1)
SODIUM: 137 mmol/L (ref 135–145)

## 2017-10-21 LAB — I-STAT BETA HCG BLOOD, ED (MC, WL, AP ONLY)

## 2017-10-21 MED ORDER — TAMSULOSIN HCL 0.4 MG PO CAPS: 0.4000 mg | ORAL_CAPSULE | Freq: Every day | ORAL | 0 refills | Status: DC

## 2017-10-21 MED ORDER — OXYCODONE-ACETAMINOPHEN 5-325 MG PO TABS: 1.0000 | ORAL_TABLET | Freq: Four times a day (QID) | ORAL | 0 refills | Status: DC | PRN

## 2017-10-21 MED ORDER — IBUPROFEN 600 MG PO TABS: 600.0000 mg | ORAL_TABLET | Freq: Four times a day (QID) | ORAL | 0 refills | Status: DC | PRN

## 2017-10-21 MED ORDER — KETOROLAC TROMETHAMINE 60 MG/2ML IM SOLN
60.0000 mg | Freq: Once | INTRAMUSCULAR | Status: AC
  Administered 2017-10-21: 60 mg via INTRAMUSCULAR
  Filled 2017-10-21: qty 2

## 2017-10-21 NOTE — ED Triage Notes (Signed)
Pt reports L flank pain with radiation into L lower abd onset 2100. Pt reports N/V. Pt states she thinks it is a kidney stone but has no prior hx. Pt also reports urinary frequency.

## 2017-10-21 NOTE — ED Triage Notes (Signed)
Pt also took oxycodone 2 hrs ago

## 2017-10-21 NOTE — ED Provider Notes (Signed)
[facility] EMERGENCY DEPARTMENT Provider Note   CSN: [id number] Arrival date & time: 10/21/17  0015    History   Chief Complaint Chief Complaint  Patient presents with  . Flank Pain    HPI Yolanda Wolfe is a 42 y.o. female.   42 year old female with no significant past medical history presents to the emergency department for evaluation of left flank pain.  Symptoms began at 2100 with radiation to her left lower abdomen.  Pain has been constant and was gradually worsening initially.  Symptoms have improved slightly since taking oxycodone prior to arrival.  She did have nausea and vomiting secondary to worsening pain.  She reports an urge to void without hematuria or dysuria.  No associated fevers, bowel changes.  Abdominal surgical history significant for abdominal hysterectomy and cesarean section.     Past Medical History:  Diagnosis Date  . Anxiety   . Blood transfusion without reported diagnosis   . Depression   . Frequent headaches   . GERD (gastroesophageal reflux disease)   . Heart murmur   . Migraine   . UTI (lower urinary tract infection)     Patient Active Problem List   Diagnosis Date Noted  . Allergic rhinitis 08/16/2017  . Hyperlipidemia, mixed 08/16/2017  . Class 1 obesity with body mass index (BMI) of 32.0 to 32.9 in adult 08/16/2017  . Major depressive disorder, recurrent episode ([place]) 05/07/2015  . Weight gain 06/19/2014  . Depression 06/19/2014  . Routine general medical examination at a health care facility 06/19/2014  . Insomnia 06/19/2014    Past Surgical History:  Procedure Laterality Date  . ABDOMINAL HYSTERECTOMY  2009  . CESAREAN SECTION       OB History   None      Home Medications    Prior to Admission medications   Medication Sig Start Date End Date Taking? Authorizing Provider  FLUoxetine (PROZAC) 20 MG capsule Take 1 capsule (20 mg total) by mouth daily. 09/27/17 09/27/18  Plovsky, Berneta Sages, MD    ibuprofen (ADVIL,MOTRIN) 600 MG tablet Take 1 tablet (600 mg total) by mouth every 6 (six) hours as needed. 10/21/17   Antonietta Breach, PA-C  oxyCODONE-acetaminophen (PERCOCET/ROXICET) 5-325 MG tablet Take 1-2 tablets by mouth every 6 (six) hours as needed for severe pain. 10/21/17   Antonietta Breach, PA-C  sertraline (ZOLOFT) 50 MG tablet Take 1 tablet (50 mg total) by mouth daily. 09/27/17   Plovsky, Berneta Sages, MD  tamsulosin (FLOMAX) 0.4 MG CAPS capsule Take 1 capsule (0.4 mg total) by mouth daily. 10/21/17   Antonietta Breach, PA-C  temazepam (RESTORIL) 30 MG capsule Take 1 capsule (30 mg total) by mouth at bedtime as needed for sleep. 09/27/17   Norma Fredrickson, MD    Family History Family History  Problem Relation Age of Onset  . Cancer Maternal Grandmother        breast  . Heart disease Maternal Grandfather   . Alcohol abuse Paternal Grandfather   . Dementia Paternal Grandmother   . Cancer Mother   . Cancer Father     Social History Social History   Tobacco Use  . Smoking status: Never Smoker  . Smokeless tobacco: Never Used  Substance Use Topics  . Alcohol use: Yes    Alcohol/week: 0.6 oz    Types: 1 Glasses of wine per week  . Drug use: No     Allergies   Patient has no known allergies.   Review of Systems Review of Systems Ten  systems reviewed and are negative for acute change, except as noted in the HPI.    Physical Exam Updated Vital Signs BP 118/79 (BP Location: Left Arm)   Pulse 76   Temp 97.7 F (36.5 C) (Oral)   Resp 16   Ht 5\' 4"  (1.626 m)   Wt 81.6 kg (180 lb)   SpO2 99%   BMI 30.90 kg/m   Physical Exam  Constitutional: She is oriented to person, place, and time. She appears well-developed and well-nourished. No distress.  Nontoxic appearing and in no acute distress.  Pleasant.  HENT:  Head: Normocephalic and atraumatic.  Eyes: Conjunctivae and EOM are normal. No scleral icterus.  Neck: Normal range of motion.  Cardiovascular: Normal rate, regular rhythm and  intact distal pulses.  Pulmonary/Chest: Effort normal. No stridor. No respiratory distress.  Respirations even and unlabored  Abdominal: Soft. She exhibits no mass. There is tenderness. There is no guarding.  Soft abdomen with tenderness to palpation in the left upper quadrant and left mid abdomen.  No palpable masses or peritoneal signs.  Musculoskeletal: Normal range of motion.  Neurological: She is alert and oriented to person, place, and time. She exhibits normal muscle tone. Coordination normal.  GCS 15.  Patient moving all extremities.  Skin: Skin is warm and dry. No rash noted. She is not diaphoretic. No erythema. No pallor.  Psychiatric: She has a normal mood and affect. Her behavior is normal.  Nursing note and vitals reviewed.    ED Treatments / Results  Labs (all labs ordered are listed, but only abnormal results are displayed) Labs Reviewed  URINALYSIS, ROUTINE W REFLEX MICROSCOPIC - Abnormal; Notable for the following components:      Result Value   APPearance CLOUDY (*)    pH 9.0 (*)    Hgb urine dipstick MODERATE (*)    Ketones, ur 5 (*)    Bacteria, UA RARE (*)    All other components within normal limits  CBC - Abnormal; Notable for the following components:   WBC 18.5 (*)    All other components within normal limits  BASIC METABOLIC PANEL - Abnormal; Notable for the following components:   Glucose, Bld 147 (*)    All other components within normal limits  I-STAT BETA HCG BLOOD, ED ([facility], [facility], [facility] ONLY)    EKG None  Radiology Ct Renal Stone Study  Result Date: 10/21/2017 CLINICAL DATA:  Left-sided flank pain EXAM: CT ABDOMEN AND PELVIS WITHOUT CONTRAST TECHNIQUE: Multidetector CT imaging of the abdomen and pelvis was performed following the standard protocol without IV contrast. COMPARISON:  None. FINDINGS: Lower chest: Lung bases are clear.  Borderline heart size. Hepatobiliary: No focal liver abnormality is seen. No gallstones, gallbladder wall thickening, or  biliary dilatation. Pancreas: Unremarkable. No pancreatic ductal dilatation or surrounding inflammatory changes. Spleen: Normal in size without focal abnormality. Adrenals/Urinary Tract: Adrenal glands are within normal limits. No right hydronephrosis. Mild to moderate left hydronephrosis and perinephric fat stranding. Left hydroureter, secondary to a 2 mm stone in the distal left ureter about a cm proximal to the left UVJ. Bladder is nearly empty Stomach/Bowel: Stomach is within normal limits. Appendix appears normal. No evidence of bowel wall thickening, distention, or inflammatory changes. Vascular/Lymphatic: No significant vascular findings are present. No enlarged abdominal or pelvic lymph nodes. Reproductive: Status post hysterectomy. No adnexal masses. Other: Negative for free air or free fluid. Musculoskeletal: No acute or significant osseous findings. IMPRESSION: Mild to moderate left hydronephrosis and hydroureter, secondary to a  2 mm stone in the distal left ureter about a cm proximal to the left UVJ. There are no other acute abnormalities visualized. Electronically Signed   By: Donavan Foil M.D.   On: 10/21/2017 02:56    Procedures Procedures (including critical care time)  Medications Ordered in ED Medications  ketorolac (TORADOL) injection 60 mg (has no administration in time range)     Initial Impression / Assessment and Plan / ED Course  I have reviewed the triage vital signs and the nursing notes.  Pertinent labs & imaging results that were available during my care of the patient were reviewed by me and considered in my medical decision making (see chart for details).     Patient has been diagnosed with a kidney stone via CT. There is no evidence of significant hydronephrosis, serum creatine WNL, vitals sign stable and the patient does not have irratractable vomiting. Patient will be discharged home with pain medications & has been advised to follow up with PCP. Return  precautions discussed and provided. Patient discharged in stable condition with no unaddressed concerns.   Final Clinical Impressions(s) / ED Diagnoses   Final diagnoses:  Left flank pain  Kidney stone    ED Discharge Orders        Ordered    tamsulosin (FLOMAX) 0.4 MG CAPS capsule  Daily     10/21/17 0352    oxyCODONE-acetaminophen (PERCOCET/ROXICET) 5-325 MG tablet  Every 6 hours PRN     10/21/17 0352    ibuprofen (ADVIL,MOTRIN) 600 MG tablet  Every 6 hours PRN     10/21/17 0352       Antonietta Breach, PA-C 10/21/17 0359    Ward, Delice Bison, DO 10/21/17 [id number]

## 2017-10-21 NOTE — Discharge Instructions (Signed)
Your CT today showed a 2 mm kidney stone.  This will pass on its own.  It is about 0.5 inches away from emptying into your bladder.  We recommend that you take Flomax to promote stone movement.  Continue with ibuprofen every 6 hours for pain control.  You may supplement this with Percocet as prescribed.  Return to the emergency department for new or concerning symptoms.

## 2017-10-21 NOTE — Telephone Encounter (Signed)
received fax that 30mg  capsules of temazepam is on manufacturer backorder with no eta of availability. please send in a new rx 15mg  with quanithy of 60.

## 2017-10-23 NOTE — Telephone Encounter (Signed)
Per Dr. Casimiro Needle, new strength called into the pharmacy

## 2017-10-24 ENCOUNTER — Other Ambulatory Visit (HOSPITAL_COMMUNITY): Payer: Self-pay | Admitting: Psychiatry

## 2017-10-24 ENCOUNTER — Other Ambulatory Visit (HOSPITAL_COMMUNITY): Payer: Self-pay

## 2017-10-24 DIAGNOSIS — F339 Major depressive disorder, recurrent, unspecified: Secondary | ICD-10-CM

## 2017-10-24 MED ORDER — TEMAZEPAM 15 MG PO CAPS: 30.0000 mg | ORAL_CAPSULE | Freq: Every evening | ORAL | 5 refills | Status: DC | PRN

## 2017-10-24 NOTE — Progress Notes (Signed)
Resent Temazepam to the drug store, the 30 mg are on backorder so it has been changed to 15 mg 2 tabs at bedtime, quantity #60

## 2017-10-28 ENCOUNTER — Other Ambulatory Visit (HOSPITAL_COMMUNITY): Payer: Self-pay

## 2017-10-28 MED ORDER — SERTRALINE HCL 50 MG PO TABS: 50.0000 mg | ORAL_TABLET | Freq: Every day | ORAL | 0 refills | Status: DC

## 2017-10-31 ENCOUNTER — Encounter: Payer: Self-pay | Admitting: Family Medicine

## 2017-10-31 ENCOUNTER — Ambulatory Visit: Payer: Commercial Managed Care - PPO | Admitting: Family Medicine

## 2017-10-31 VITALS — BP 118/70 | HR 72 | Temp 98.3°F | Resp 12 | Ht 64.0 in | Wt 186.4 lb

## 2017-10-31 DIAGNOSIS — N200 Calculus of kidney: Secondary | ICD-10-CM

## 2017-10-31 DIAGNOSIS — N1330 Unspecified hydronephrosis: Secondary | ICD-10-CM

## 2017-10-31 DIAGNOSIS — D72829 Elevated white blood cell count, unspecified: Secondary | ICD-10-CM | POA: Diagnosis not present

## 2017-10-31 LAB — POC URINALSYSI DIPSTICK (AUTOMATED)
Bilirubin, UA: NEGATIVE
Glucose, UA: NEGATIVE
KETONES UA: NEGATIVE
Nitrite, UA: NEGATIVE
PROTEIN UA: NEGATIVE
Spec Grav, UA: 1.015 (ref 1.010–1.025)
Urobilinogen, UA: 0.2 E.U./dL
pH, UA: 6 (ref 5.0–8.0)

## 2017-10-31 LAB — CBC
HCT: 38.9 % (ref 36.0–46.0)
HEMOGLOBIN: 13.3 g/dL (ref 12.0–15.0)
MCHC: 34.2 g/dL (ref 30.0–36.0)
MCV: 84.7 fl (ref 78.0–100.0)
PLATELETS: 322 10*3/uL (ref 150.0–400.0)
RBC: 4.59 Mil/uL (ref 3.87–5.11)
RDW: 13 % (ref 11.5–15.5)
WBC: 8.7 10*3/uL (ref 4.0–10.5)

## 2017-10-31 NOTE — Progress Notes (Signed)
HPI:   Yolanda Wolfe is a 42 y.o. female, who is here today to follow on recent ED visit.    She presented to the ER on 10/21/2017 with left flank pain, radiated to her left lower abdomen. Pain was associated with nausea and vomiting. Diagnosed with nephrolithiasis, no prior Hx. She was discharged on Flomax 0.4 mg daily and Percocet 5-325 mg every 6 hours as needed.  Pain resolved a few hours after ER visit. No gross hematuria but noted some dark urine for 2 days.  No fever,chills, nausea, vomiting,or diarrhea. Urine urgency has resolved. Until yesterday she was still having some "stinging"/burning sensation upon urination but resolved today.  She is not straining her urine.   CT abdomen and pelvis without contrast:  Mild to moderate left hydronephrosis and hydroureter, secondary to a 2 mm stone in the distal left ureter about a cm proximal to the left UVJ. There are no other acute abnormalities visualized.  Lab Results  Component Value Date   CREATININE 0.88 10/21/2017   BUN 13 10/21/2017   NA 137 10/21/2017   K 4.3 10/21/2017   CL 103 10/21/2017   CO2 24 10/21/2017   Lab Results  Component Value Date   WBC 18.5 (H) 10/21/2017   HGB 12.8 10/21/2017   HCT 39.2 10/21/2017   MCV 86.5 10/21/2017   PLT 329 10/21/2017     Review of Systems  Constitutional: Negative for activity change, appetite change, fatigue and fever.  HENT: Negative for mouth sores and sore throat.   Respiratory: Negative for shortness of breath and wheezing.   Cardiovascular: Negative for leg swelling.  Gastrointestinal: Negative for abdominal pain, diarrhea, nausea and vomiting.  Genitourinary: Negative for decreased urine volume, difficulty urinating, frequency and hematuria.  Musculoskeletal: Negative for back pain and myalgias.  Skin: Negative for pallor and rash.      Current Outpatient Medications on File Prior to Visit  Medication Sig Dispense Refill  .  FLUoxetine (PROZAC) 20 MG capsule Take 1 capsule (20 mg total) by mouth daily. 30 capsule 2  . hydroquinone 4 % cream Apply 4 % topically daily.    . sertraline (ZOLOFT) 50 MG tablet Take 1 tablet (50 mg total) by mouth daily. 90 tablet 0  . tazarotene (AVAGE) 0.1 % cream APPLY 1 APPLICATION APPLY ON THE SKIN NIGHTLY  11  . temazepam (RESTORIL) 15 MG capsule Take 2 capsules (30 mg total) by mouth at bedtime as needed for sleep. 60 capsule 5   No current facility-administered medications on file prior to visit.      Past Medical History:  Diagnosis Date  . Anxiety   . Blood transfusion without reported diagnosis   . Depression   . Frequent headaches   . GERD (gastroesophageal reflux disease)   . Heart murmur   . Migraine   . UTI (lower urinary tract infection)    No Known Allergies  Social History   Socioeconomic History  . Marital status: Divorced    Spouse name: Not on file  . Number of children: 1  . Years of education: Not on file  . Highest education level: Not on file  Occupational History  . Not on file  Social Needs  . Financial resource strain: Not on file  . Food insecurity:    Worry: Not on file    Inability: Not on file  . Transportation needs:    Medical: Not on file    Non-medical: Not on file  Tobacco Use  . Smoking status: Never Smoker  . Smokeless tobacco: Never Used  Substance and Sexual Activity  . Alcohol use: Yes    Alcohol/week: 0.6 oz    Types: 1 Glasses of wine per week  . Drug use: No  . Sexual activity: Yes    Partners: Male    Birth control/protection: Surgical  Lifestyle  . Physical activity:    Days per week: Not on file    Minutes per session: Not on file  . Stress: Not on file  Relationships  . Social connections:    Talks on phone: Not on file    Gets together: Not on file    Attends religious service: Not on file    Active member of club or organization: Not on file    Attends meetings of clubs or organizations: Not on  file    Relationship status: Not on file  Other Topics Concern  . Not on file  Social History Narrative  . Not on file    Vitals:   10/31/17 1131  BP: 118/70  Pulse: 72  Resp: 12  Temp: 98.3 F (36.8 C)  SpO2: 97%   Body mass index is 31.99 kg/m.   Physical Exam  Nursing note and vitals reviewed. Constitutional: She is oriented to person, place, and time. She appears well-developed. No distress.  HENT:  Head: Normocephalic and atraumatic.  Mouth/Throat: Oropharynx is clear and moist and mucous membranes are normal.  Eyes: Conjunctivae are normal.  Cardiovascular: Normal rate and regular rhythm.  No murmur heard. Pulses:      Dorsalis pedis pulses are 2+ on the right side, and 2+ on the left side.  Respiratory: Effort normal and breath sounds normal. No respiratory distress.  GI: Soft. She exhibits no mass. There is no tenderness. There is no CVA tenderness.  Musculoskeletal: She exhibits no edema.  Lymphadenopathy:    She has no cervical adenopathy.  Neurological: She is alert and oriented to person, place, and time. She has normal strength. Gait normal.  Skin: Skin is warm. No erythema.  Psychiatric: She has a normal mood and affect.  Well groomed,good eye contact.    ASSESSMENT AND PLAN:  Yolanda Wolfe was seen today for er follow-up.  Orders Placed This Encounter  Procedures  . US Renal  . DG Abd 1 View  . CBC  . POCT Urinalysis Dipstick (Automated)     Lab Results  Component Value Date   WBC 8.7 10/31/2017   HGB 13.3 10/31/2017   HCT 38.9 10/31/2017   MCV 84.7 10/31/2017   PLT 322.0 10/31/2017    Nephrolithiasis  Symptoms have resolved. Urine today 1+ blood. Urine strainer given, she can strain urine for a couple days. She is not longer on Flomax. Adequate hydration. KUB ordered. Instructed about warning signs.  -     POCT Urinalysis Dipstick (Automated) -     US Renal; Future -     DG Abd 1 View; Future  Hydronephrosis of left  kidney  Options discussed: Urology referral vs renal US and recommendations accordingly.She agrees with the latter one since she is already asymptomatic I am expecting problem improvement or resolution. Instructed about warning signs.  -     US Renal; Future  Leukocytosis, unspecified type  Afebrile. Further recommendations will be given according to CBC results.    -     CBC      Eriyanna Kofoed G. [place], MD  [facility]. [place] office.

## 2017-10-31 NOTE — Patient Instructions (Addendum)
A few things to remember from today's visit:   Hydronephrosis of left kidney - Plan: US Renal  Nephrolithiasis - Plan: POCT Urinalysis Dipstick (Automated), US Renal, DG Abd 1 View  Leukocytosis, unspecified type - Plan: CBC Today X ray was ordered.  This can be done at [facility] at [facility] between 8 am and 5 pm: [place]. [phone].    Kidney Stones Kidney stones (urolithiasis) are rock-like masses that form inside of the kidneys. Kidneys are organs that make pee (urine). A kidney stone can cause very bad pain and can block the flow of pee. The stone usually leaves your body (passes) through your pee. You may need to have a doctor take out the stone. Follow these instructions at home: Eating and drinking  Drink enough fluid to keep your pee clear or pale yellow. This will help you pass the stone.  If told by your doctor, change the foods you eat (your diet). This may include: ? Limiting how much salt (sodium) you eat. ? Eating more fruits and vegetables. ? Limiting how much meat, poultry, fish, and eggs you eat.  Follow instructions from your doctor about eating or drinking restrictions. General instructions  Collect pee samples as told by your doctor. You may need to collect a pee sample: ? 24 hours after a stone comes out. ? 8-12 weeks after a stone comes out, and every 6-12 months after that.  Strain your pee every time you pee (urinate), for as long as told. Use the strainer that your doctor recommends.  Do not throw out the stone. Keep it so that it can be tested by your doctor.  Take over-the-counter and prescription medicines only as told by your doctor.  Keep all follow-up visits as told by your doctor. This is important. You may need follow-up tests. Preventing kidney stones To prevent another kidney stone:  Drink enough fluid to keep your pee clear or pale yellow. This is the best way to prevent kidney stones.  Eat healthy  foods.  Avoid certain foods as told by your doctor. You may be told to eat less protein.  Stay at a healthy weight.  Contact a doctor if:  You have pain that gets worse or does not get better with medicine. Get help right away if:  You have a fever or chills.  You get very bad pain.  You get new pain in your belly (abdomen).  You pass out (faint).  You cannot pee. This information is not intended to replace advice given to you by your health care provider. Make sure you discuss any questions you have with your health care provider. Document Released: 11/23/2007 Document Revised: 02/23/2016 Document Reviewed: 02/23/2016 Elsevier Interactive Patient Education  2017 [place].  Please be sure medication list is accurate. If a new problem present, please set up appointment sooner than planned today.

## 2017-11-06 ENCOUNTER — Encounter (HOSPITAL_COMMUNITY): Payer: Self-pay | Admitting: Psychology

## 2017-11-06 ENCOUNTER — Encounter: Payer: Self-pay | Admitting: *Deleted

## 2017-11-06 NOTE — Progress Notes (Signed)
Yolanda Wolfe is a 42 y.o. female patient who is discharged from counseling as goals met.  Pt last appointment was on 12/14/16.    Outpatient Therapist Discharge Summary  Yolanda Wolfe    1975/11/10   Admission Date: 05/07/15   Discharge Date:  04/20/17 Reason for Discharge:  Met goals- tx complete Diagnosis:  MDD in full remission  Comments:  At 12/14/16 appointment- discussed discharging if maintain remission through transition of new job- f/u was in 01/2017.  F/u w/ cancelled- pt didn't need to return.   Jenne Campus, LPC

## 2017-11-15 DIAGNOSIS — D2372 Other benign neoplasm of skin of left lower limb, including hip: Secondary | ICD-10-CM | POA: Diagnosis not present

## 2017-11-22 DIAGNOSIS — Z6831 Body mass index (BMI) 31.0-31.9, adult: Secondary | ICD-10-CM | POA: Diagnosis not present

## 2017-11-22 DIAGNOSIS — Z01419 Encounter for gynecological examination (general) (routine) without abnormal findings: Secondary | ICD-10-CM | POA: Diagnosis not present

## 2017-11-22 DIAGNOSIS — Z1231 Encounter for screening mammogram for malignant neoplasm of breast: Secondary | ICD-10-CM | POA: Diagnosis not present

## 2017-11-23 ENCOUNTER — Telehealth (HOSPITAL_COMMUNITY): Payer: Self-pay

## 2017-11-23 NOTE — Telephone Encounter (Signed)
Patient is calling to report that her pharmacy is unable to get temazepam at this time and wants to know if you have another medication she could try. Please review and advise, thank you

## 2017-11-24 ENCOUNTER — Other Ambulatory Visit (HOSPITAL_COMMUNITY): Payer: Self-pay

## 2017-11-24 MED ORDER — LORAZEPAM 1 MG PO TABS: 1.0000 mg | ORAL_TABLET | Freq: Every day | ORAL | 2 refills | Status: DC

## 2017-11-24 NOTE — Telephone Encounter (Signed)
Per Dr. Casimiro Needle, I called in Lorazepam 1 mg 1 po qhs #30x2 to patients pharmacy, I called patient to let her know

## 2017-12-05 DIAGNOSIS — G4700 Insomnia, unspecified: Secondary | ICD-10-CM | POA: Diagnosis not present

## 2017-12-27 ENCOUNTER — Ambulatory Visit (HOSPITAL_COMMUNITY): Payer: Self-pay | Admitting: Psychiatry

## 2018-01-23 DIAGNOSIS — D2372 Other benign neoplasm of skin of left lower limb, including hip: Secondary | ICD-10-CM | POA: Diagnosis not present

## 2018-01-23 DIAGNOSIS — D485 Neoplasm of uncertain behavior of skin: Secondary | ICD-10-CM | POA: Diagnosis not present

## 2018-01-24 ENCOUNTER — Encounter (HOSPITAL_COMMUNITY): Payer: Self-pay | Admitting: Psychiatry

## 2018-01-24 ENCOUNTER — Ambulatory Visit (INDEPENDENT_AMBULATORY_CARE_PROVIDER_SITE_OTHER): Payer: Commercial Managed Care - PPO | Admitting: Psychiatry

## 2018-01-24 VITALS — BP 126/70 | HR 78 | Ht 64.0 in | Wt 191.0 lb

## 2018-01-24 DIAGNOSIS — F325 Major depressive disorder, single episode, in full remission: Secondary | ICD-10-CM

## 2018-01-24 MED ORDER — LORAZEPAM 1 MG PO TABS: 1.0000 mg | ORAL_TABLET | Freq: Every day | ORAL | 4 refills | Status: DC

## 2018-01-24 NOTE — Progress Notes (Signed)
Patient ID: Yolanda Wolfe, female   DOB: 04/08/1976, 42 y.o.   MRN: [id number] [facility] MD Progress Note  01/24/2018 1:52 PM Yolanda Wolfe  MRN:  [id number] Subjective:  Spirits are good Principal Problem: Major depression, recurrent episode  Today the patient is doing fairly well. She still had great difficulty coming off of Prozac. She's been off of it for about 3 weeks. Still in the last week or so she had significant nausea. I suspect this is the last residual effects from coming off Prozac. Today we clarified while taking her off SSRIs. She is in conjunction with this because she complained unexplainable and persistent weight gain. He possibly also affected her sex drive. Therefore we come off of it. The patient now seems to be in bilirubin. Nonetheless we'll give her a few more months to stay off of Prozac and demonstrate that her nausea is related to coming off Prozac the SSRI. The patient denies daily depression. She sleeping and eating well. She's in a good relationship that she's just got engaged. She loves this man can communicate and negotiate well. She trusted. Patient is working is doing fairly well. She likes where she works. Some people that she doesn't like at work setting for the most part she's getting along very well. Her 96 year old daughter is doing great. The patient generally sleeps fairly well. She's taking 1 mg of Ativan to help. There are a number of nights however when she just can't sleep irregardless is taking. Patient is not suicidal. Patient Active Problem List   Diagnosis Date Noted  . Nephrolithiasis [N20.0] 10/31/2017  . Allergic rhinitis [J30.9] 08/16/2017  . Hyperlipidemia, mixed [E78.2] 08/16/2017  . Class 1 obesity with body mass index (BMI) of 32.0 to 32.9 in adult [E66.9, Z68.32] 08/16/2017  . Major depressive disorder, recurrent episode ([place]) [F33.9] 05/07/2015  . Weight gain [R63.5] 06/19/2014  . Depression [F32.9] 06/19/2014  . Routine  general medical examination at a health care facility [Z00.00] 06/19/2014  . Insomnia [G47.00] 06/19/2014   Total Time spent with patient: 15 minutes  Past Psychiatric History:   Past Medical History:  Past Medical History:  Diagnosis Date  . Anxiety   . Blood transfusion without reported diagnosis   . Depression   . Frequent headaches   . GERD (gastroesophageal reflux disease)   . Heart murmur   . Migraine   . UTI (lower urinary tract infection)     Past Surgical History:  Procedure Laterality Date  . ABDOMINAL HYSTERECTOMY  2009  . CESAREAN SECTION     Family History:  Family History  Problem Relation Age of Onset  . Cancer Maternal Grandmother        breast  . Heart disease Maternal Grandfather   . Alcohol abuse Paternal Grandfather   . Dementia Paternal Grandmother   . Cancer Mother   . Cancer Father    Family Psychiatric  History:  Social History:  Social History   Substance and Sexual Activity  Alcohol Use Yes  . Alcohol/week: 0.6 oz  . Types: 1 Glasses of wine per week     Social History   Substance and Sexual Activity  Drug Use No    Social History   Socioeconomic History  . Marital status: Divorced    Spouse name: Not on file  . Number of children: 1  . Years of education: Not on file  . Highest education level: Not on file  Occupational History  . Not on file  Social Needs  .  Financial resource strain: Not on file  . Food insecurity:    Worry: Not on file    Inability: Not on file  . Transportation needs:    Medical: Not on file    Non-medical: Not on file  Tobacco Use  . Smoking status: Never Smoker  . Smokeless tobacco: Never Used  Substance and Sexual Activity  . Alcohol use: Yes    Alcohol/week: 0.6 oz    Types: 1 Glasses of wine per week  . Drug use: No  . Sexual activity: Yes    Partners: Male    Birth control/protection: Surgical  Lifestyle  . Physical activity:    Days per week: Not on file    Minutes per session: Not  on file  . Stress: Not on file  Relationships  . Social connections:    Talks on phone: Not on file    Gets together: Not on file    Attends religious service: Not on file    Active member of club or organization: Not on file    Attends meetings of clubs or organizations: Not on file    Relationship status: Not on file  Other Topics Concern  . Not on file  Social History Narrative  . Not on file   Additional Social History:                         Sleep: Poor  Appetite:  Good  Current Medications: Current Outpatient Medications  Medication Sig Dispense Refill  . hydroquinone 4 % cream Apply 4 % topically daily.    Marland Kitchen LORazepam (ATIVAN) 1 MG tablet Take 1 tablet (1 mg total) by mouth at bedtime. 1  qhs   1  Prn for sleep 35 tablet 4  . tazarotene (AVAGE) 0.1 % cream APPLY 1 APPLICATION APPLY ON THE SKIN NIGHTLY  11  . FLUoxetine (PROZAC) 20 MG capsule Take 1 capsule (20 mg total) by mouth daily. (Patient not taking: Reported on 01/24/2018) 30 capsule 2  . sertraline (ZOLOFT) 50 MG tablet Take 1 tablet (50 mg total) by mouth daily. (Patient not taking: Reported on 01/24/2018) 90 tablet 0  . temazepam (RESTORIL) 15 MG capsule Take 2 capsules (30 mg total) by mouth at bedtime as needed for sleep. (Patient not taking: Reported on 01/24/2018) 60 capsule 5   No current facility-administered medications for this visit.     Lab Results: No results found for this or any previous visit (from the past 48 hour(s)).  Blood Alcohol level:  No results found for: [facility]  Physical Findings: AIMS:  , ,  ,  ,    CIWA:    COWS:     Musculoskeletal: Strength & Muscle Tone: within normal limits Gait & Station Patient leans: N/A  Psychiatric Specialty Exam: ROS  Blood pressure 126/70, pulse 78, height 5\' 4"  (1.626 m), weight 191 lb (86.6 kg).Body mass index is 32.79 kg/m.  General Appearance: Fairly Groomed  Engineer, water::  Good  Speech:  Clear and Coherent  Volume:  Normal  Mood:   Euthymic  Affect:  Appropriate  Thought Process:  Coherent  Orientation:  Full (Time, Place, and Person)  Thought Content:  WDL  Suicidal Thoughts:  No  Homicidal Thoughts:  No  Memory:  NA  Judgement:  Good  Insight:  Fair  Psychomotor Activity:  Normal  Concentration:  Good  Recall:  Good  Fund of Knowledge:Good  Language: Good  Akathisia:  No  Handed:  Right  AIMS (if indicated):     Assets:  Financial Resources/Insurance  ADL's:  Intact  Cognition: WNL  Sleep:      Treatment Plan Summary: At this time the patient will continue taking Zoloft 100 mg. Is not affecting her sex drive and I do not think is affecting her weight. She claims that seems to feel that is working well. Now she sleeping fairly well taking Restoril 15 mg 2 at night. The patient will stay in therapy. The patient was given her for all to see Dr. Emi Belfast at [place]. This patient return to see me in 5 months.  Jerral Ralph, MD 01/24/2018, 1:52 PM Patient ID: Yolanda Wolfe, female   DOB: 22-Nov-1975, 42 y.o.   MRN: [id number] [facility] MD Progress Note  01/24/2018 1:52 PM Yolanda Wolfe  MRN:  [id number] Subjective:  Spirits are good Principal Problem: Major depression, recurrent episode Diagnosis:  Major depression, recurrent episode + Total Time spent with patient: 15 minutes  Past Psychiatric History:   Past Medical History:  Past Medical History:  Diagnosis Date  . Anxiety   . Blood transfusion without reported diagnosis   . Depression   . Frequent headaches   . GERD (gastroesophageal reflux disease)   . Heart murmur   . Migraine   . UTI (lower urinary tract infection)     Past Surgical History:  Procedure Laterality Date  . ABDOMINAL HYSTERECTOMY  2009  . CESAREAN SECTION     Family History:  Family History  Problem Relation Age of Onset  . Cancer Maternal Grandmother        breast  . Heart disease Maternal Grandfather   . Alcohol abuse Paternal  Grandfather   . Dementia Paternal Grandmother   . Cancer Mother   . Cancer Father    Family Psychiatric  History:  Social History:  Social History   Substance and Sexual Activity  Alcohol Use Yes  . Alcohol/week: 0.6 oz  . Types: 1 Glasses of wine per week     Social History   Substance and Sexual Activity  Drug Use No    Social History   Socioeconomic History  . Marital status: Divorced    Spouse name: Not on file  . Number of children: 1  . Years of education: Not on file  . Highest education level: Not on file  Occupational History  . Not on file  Social Needs  . Financial resource strain: Not on file  . Food insecurity:    Worry: Not on file    Inability: Not on file  . Transportation needs:    Medical: Not on file    Non-medical: Not on file  Tobacco Use  . Smoking status: Never Smoker  . Smokeless tobacco: Never Used  Substance and Sexual Activity  . Alcohol use: Yes    Alcohol/week: 0.6 oz    Types: 1 Glasses of wine per week  . Drug use: No  . Sexual activity: Yes    Partners: Male    Birth control/protection: Surgical  Lifestyle  . Physical activity:    Days per week: Not on file    Minutes per session: Not on file  . Stress: Not on file  Relationships  . Social connections:    Talks on phone: Not on file    Gets together: Not on file    Attends religious service: Not on file    Active member of club or organization: Not on file  Attends meetings of clubs or organizations: Not on file    Relationship status: Not on file  Other Topics Concern  . Not on file  Social History Narrative  . Not on file   Additional Social History:                         Sleep: Poor  Appetite:  Good  Current Medications: Current Outpatient Medications  Medication Sig Dispense Refill  . hydroquinone 4 % cream Apply 4 % topically daily.    Marland Kitchen LORazepam (ATIVAN) 1 MG tablet Take 1 tablet (1 mg total) by mouth at bedtime. 1  qhs   1  Prn for sleep  35 tablet 4  . tazarotene (AVAGE) 0.1 % cream APPLY 1 APPLICATION APPLY ON THE SKIN NIGHTLY  11  . FLUoxetine (PROZAC) 20 MG capsule Take 1 capsule (20 mg total) by mouth daily. (Patient not taking: Reported on 01/24/2018) 30 capsule 2  . sertraline (ZOLOFT) 50 MG tablet Take 1 tablet (50 mg total) by mouth daily. (Patient not taking: Reported on 01/24/2018) 90 tablet 0  . temazepam (RESTORIL) 15 MG capsule Take 2 capsules (30 mg total) by mouth at bedtime as needed for sleep. (Patient not taking: Reported on 01/24/2018) 60 capsule 5   No current facility-administered medications for this visit.     Lab Results: No results found for this or any previous visit (from the past 48 hour(s)).  Blood Alcohol level:  No results found for: [facility]  Physical Findings: AIMS:  , ,  ,  ,    CIWA:    COWS:     Musculoskeletal: Strength & Muscle Tone: within normal limits Gait & Station Patient leans: N/A  Psychiatric Specialty Exam: ROS  Blood pressure 126/70, pulse 78, height 5\' 4"  (1.626 m), weight 191 lb (86.6 kg).Body mass index is 32.79 kg/m.  General Appearance: Fairly Groomed  Engineer, water::  Good  Speech:  Clear and Coherent  Volume:  Normal  Mood:  Euthymic  Affect:  Appropriate  Thought Process:  Coherent  Orientation:  Full (Time, Place, and Person)  Thought Content:  WDL  Suicidal Thoughts:  No  Homicidal Thoughts:  No  Memory:  NA  Judgement:  Good  Insight:  Fair  Psychomotor Activity:  Normal  Concentration:  Good  Recall:  Good  Fund of Knowledge:Good  Language: Good  Akathisia:  No  Handed:  Right  AIMS (if indicated):     Assets:  Financial Resources/Insurance  ADL's:  Intact  Cognition: WNL  Sleep:      Treatment Plan Summary  At this time this patient's #1 problem seems to be coming off antidepressants. She changed from Zoloft to Prozac and now is been off Prozac for about 3 weeks.She'll continue taking no SSRIs. Today we discussed the possibility of taking  Wellbutrin if in fact her depression would recur. It is obvious she has problems with SSRI specifically coming off it. Perhaps it causes weight gain might affect her sex drive. Her second problem is transient insomnia. This time the patient will take Ativan 1 mg every night and availability of taking one on a when necessary basis. Today we spent more than 50% counseling about her medications and effects on her. Patient is doing well. She'll return in 3 months. At that time she is stable consider the possibility of reducing her Ativan.

## 2018-02-06 DIAGNOSIS — L700 Acne vulgaris: Secondary | ICD-10-CM | POA: Diagnosis not present

## 2018-02-06 DIAGNOSIS — B360 Pityriasis versicolor: Secondary | ICD-10-CM | POA: Diagnosis not present

## 2018-02-09 ENCOUNTER — Ambulatory Visit: Payer: Commercial Managed Care - PPO | Admitting: Family Medicine

## 2018-02-09 ENCOUNTER — Encounter: Payer: Self-pay | Admitting: Family Medicine

## 2018-02-09 VITALS — BP 102/68 | HR 97 | Temp 98.4°F | Ht 64.0 in | Wt 188.4 lb

## 2018-02-09 DIAGNOSIS — M542 Cervicalgia: Secondary | ICD-10-CM | POA: Diagnosis not present

## 2018-02-09 MED ORDER — METHYLPREDNISOLONE 4 MG PO TBPK: ORAL_TABLET | ORAL | 0 refills | Status: DC

## 2018-02-09 MED ORDER — CYCLOBENZAPRINE HCL 10 MG PO TABS: 10.0000 mg | ORAL_TABLET | Freq: Three times a day (TID) | ORAL | 0 refills | Status: DC | PRN

## 2018-02-09 NOTE — Progress Notes (Signed)
   Subjective:    Patient ID: Yolanda Wolfe, female    DOB: Dec 25, 1975, 42 y.o.   MRN: [id number]  HPI Here for 3 days of stiffness and pain in the neck. No recent trauma. She woke up with this one morning. Heat and massage helps. She took a couple hydrocodone pills which helped. Today she feels better. No radiation to the arms or hands. No fever.    Review of Systems  Constitutional: Negative.   HENT: Negative.   Eyes: Negative.   Respiratory: Negative.   Musculoskeletal: Positive for neck pain and neck stiffness.  Neurological: Negative.        Objective:   Physical Exam  Constitutional: She is oriented to person, place, and time. She appears well-developed and well-nourished.  Eyes: Pupils are equal, round, and reactive to light. Conjunctivae and EOM are normal.  Neck: No thyromegaly present.  Cardiovascular: Normal rate, regular rhythm, normal heart sounds and intact distal pulses.  Pulmonary/Chest: Effort normal and breath sounds normal.  Musculoskeletal:  She has some tenderness and spasm in the posterior neck and upper trapezei. ROM is somewhat limited.   Lymphadenopathy:    She has no cervical adenopathy.  Neurological: She is alert and oriented to person, place, and time.          Assessment & Plan:  Neck spasms. Treat with Flexeril and a Medrol dose pack. Recheck prn.  Alysia Penna, MD

## 2018-04-13 ENCOUNTER — Encounter: Payer: Self-pay | Admitting: Adult Health

## 2018-04-13 ENCOUNTER — Ambulatory Visit: Payer: Commercial Managed Care - PPO | Admitting: Adult Health

## 2018-04-13 VITALS — BP 124/72 | Temp 98.5°F | Wt 188.0 lb

## 2018-04-13 DIAGNOSIS — J069 Acute upper respiratory infection, unspecified: Secondary | ICD-10-CM

## 2018-04-13 DIAGNOSIS — B9789 Other viral agents as the cause of diseases classified elsewhere: Secondary | ICD-10-CM

## 2018-04-13 MED ORDER — HYDROCODONE-HOMATROPINE 5-1.5 MG/5ML PO SYRP: 5.0000 mL | ORAL_SOLUTION | Freq: Three times a day (TID) | ORAL | 0 refills | Status: DC | PRN

## 2018-04-13 MED ORDER — FLUTICASONE PROPIONATE 50 MCG/ACT NA SUSP: 2.0000 | Freq: Every day | NASAL | 6 refills | Status: DC

## 2018-04-13 MED ORDER — BENZONATATE 200 MG PO CAPS: 200.0000 mg | ORAL_CAPSULE | Freq: Two times a day (BID) | ORAL | 0 refills | Status: DC | PRN

## 2018-04-13 NOTE — Progress Notes (Signed)
Subjective:    Patient ID: Yolanda Wolfe, female    DOB: 1975/11/07, 42 y.o.   MRN: [id number]  URI   This is a new problem. The current episode started in the past 7 days (5 days ago ). The problem has been gradually improving. The maximum temperature recorded prior to her arrival was 100.4 - 100.9 F. The fever has been present for less than 1 day. Associated symptoms include congestion, coughing, headaches and a plugged ear sensation. Pertinent negatives include no ear pain, nausea, rhinorrhea, sinus pain, sore throat, swollen glands or wheezing. Treatments tried: Dayquil and Nyquil  The treatment provided no relief.      Review of Systems  Constitutional: Positive for fever (resolved).  HENT: Positive for congestion and postnasal drip. Negative for ear discharge, ear pain, rhinorrhea, sinus pressure, sinus pain, sore throat, trouble swallowing and voice change.   Respiratory: Positive for cough. Negative for chest tightness, shortness of breath and wheezing.   Cardiovascular: Negative.   Gastrointestinal: Negative for nausea.  Neurological: Positive for headaches.   Past Medical History:  Diagnosis Date  . Anxiety   . Blood transfusion without reported diagnosis   . Depression   . Frequent headaches   . GERD (gastroesophageal reflux disease)   . Heart murmur   . Migraine   . UTI (lower urinary tract infection)     Social History   Socioeconomic History  . Marital status: Divorced    Spouse name: Not on file  . Number of children: 1  . Years of education: Not on file  . Highest education level: Not on file  Occupational History  . Not on file  Social Needs  . Financial resource strain: Not on file  . Food insecurity:    Worry: Not on file    Inability: Not on file  . Transportation needs:    Medical: Not on file    Non-medical: Not on file  Tobacco Use  . Smoking status: Never Smoker  . Smokeless tobacco: Never Used  Substance and Sexual Activity  .  Alcohol use: Yes    Alcohol/week: 1.0 standard drinks    Types: 1 Glasses of wine per week  . Drug use: No  . Sexual activity: Yes    Partners: Male    Birth control/protection: Surgical  Lifestyle  . Physical activity:    Days per week: Not on file    Minutes per session: Not on file  . Stress: Not on file  Relationships  . Social connections:    Talks on phone: Not on file    Gets together: Not on file    Attends religious service: Not on file    Active member of club or organization: Not on file    Attends meetings of clubs or organizations: Not on file    Relationship status: Not on file  . Intimate partner violence:    Fear of current or ex partner: Not on file    Emotionally abused: Not on file    Physically abused: Not on file    Forced sexual activity: Not on file  Other Topics Concern  . Not on file  Social History Narrative  . Not on file    Past Surgical History:  Procedure Laterality Date  . ABDOMINAL HYSTERECTOMY  2009  . CESAREAN SECTION      Family History  Problem Relation Age of Onset  . Cancer Maternal Grandmother        breast  . Heart  disease Maternal Grandfather   . Alcohol abuse Paternal Grandfather   . Dementia Paternal Grandmother   . Cancer Mother   . Cancer Father     No Known Allergies  Current Outpatient Medications on File Prior to Visit  Medication Sig Dispense Refill  . LORazepam (ATIVAN) 1 MG tablet Take 1 tablet (1 mg total) by mouth at bedtime. 1  qhs   1  Prn for sleep 35 tablet 4  . tazarotene (AVAGE) 0.1 % cream APPLY 1 APPLICATION APPLY ON THE SKIN NIGHTLY  11   No current facility-administered medications on file prior to visit.     BP 124/72   Temp 98.5 F (36.9 C) (Oral)   Wt 188 lb (85.3 kg)   BMI 32.27 kg/m        Objective:   Physical Exam  Constitutional: She appears well-developed and well-nourished. No distress.  HENT:  Head: Normocephalic and atraumatic.  Right Ear: External ear normal. Tympanic  membrane is not erythematous and not bulging. A middle ear effusion is present.  Left Ear: External ear normal. Tympanic membrane is not erythematous and not bulging. A middle ear effusion is present.  Nose: Nose normal. Right sinus exhibits no maxillary sinus tenderness and no frontal sinus tenderness. Left sinus exhibits no maxillary sinus tenderness and no frontal sinus tenderness.  Mouth/Throat: Uvula is midline and mucous membranes are normal. Posterior oropharyngeal erythema (mild) present. No oropharyngeal exudate.  Cardiovascular: Normal rate, regular rhythm, normal heart sounds and intact distal pulses.  Pulmonary/Chest: Effort normal and breath sounds normal.  Neurological: She is alert.  Skin: She is not diaphoretic.  Nursing note and vitals reviewed.     Assessment & Plan:  1. Viral URI with cough - fluticasone (FLONASE) 50 MCG/ACT nasal spray; Place 2 sprays into both nostrils daily.  Dispense: 16 g; Refill: 6 - benzonatate (TESSALON) 200 MG capsule; Take 1 capsule (200 mg total) by mouth 2 (two) times daily as needed for cough.  Dispense: 20 capsule; Refill: 0 - HYDROcodone-homatropine (HYCODAN) 5-1.5 MG/5ML syrup; Take 5 mLs by mouth every 8 (eight) hours as needed for cough.  Dispense: 120 mL; Refill: 0 - Follow up if no improvement in the next 2-3 days   Dorothyann Peng, NP

## 2018-04-23 ENCOUNTER — Ambulatory Visit (INDEPENDENT_AMBULATORY_CARE_PROVIDER_SITE_OTHER): Payer: Commercial Managed Care - PPO | Admitting: Psychology

## 2018-04-23 ENCOUNTER — Encounter

## 2018-04-23 DIAGNOSIS — F330 Major depressive disorder, recurrent, mild: Secondary | ICD-10-CM | POA: Diagnosis not present

## 2018-04-24 NOTE — Progress Notes (Signed)
Comprehensive Clinical Assessment (CCA) Note  04/24/2018 Yolanda Wolfe [id number]  Visit Diagnosis:      ICD-10-CM   1. Mild episode of recurrent major depressive disorder ([place]) F33.0       CCA Part One  Part One has been completed on paper by the patient.  (See scanned document in Chart Review)  CCA Part Two A  Intake/Chief Complaint:  CCA Intake With Chief Complaint CCA Part Two Date: 04/23/18 CCA Part Two Time: 71 Chief Complaint/Presenting Problem: Pt is presenting to return to counseling to assist w/ adjusting to being off an antidepressant since April.  pt reported it was difficulty coming off the medication as dealt w/ a lot of stomach upset and headaches.  pt reports that she is currently taking Lorezapam to assist w/ intiating sleep- which does help- however pt reports that she still wakes and doesn't feel rested- daily fatigue.  pt reports that sleep studies haven't indicated any sleep disorders but has a lot of the indicators for someone w/ sleep d/o.  Pt reports that she is generally functioning well- she works full time- enjoys her job but also not what she wants longterm- hoping to get back to college setting.  pt reported that she continues to see Dr. Isidoro Donning for sleep issues and will f/u w/ Dr. Marchelle Gearing about functioning off antidepressant.  pt reports she is not liking how she feels off the antidepressant.  pt reports that she may be active and engaged for couple days feeling good and then will lack motivation to do anything for a day which bothers her.  pt reports changes include engagement to man she has been dating for couple years and this as positive.  wedding planned for Jan 2021.  Pt reports no major stressors. Patients Currently Reported Symptoms/Problems: Pt reports that she is functioning day to day- sleep improved but still consistently fatigued.  pt reports will have some days that she will ruminate on a worry and be tearful about- although realizing not  rational at all.  pt is active w/ horseback riding, family functions, time w/ her daughter and w/ her fiance.  pt reports that at times doesn't feel motivated for things after work and therefore not do things as planned- workign out- horseback riding. Pt reports she feels more irritable-cranky since off medication- generally more negative.   Collateral Involvement: Dr. Karen Chafe notes Individual's Strengths: Support of mom, fiance.  Pt positives include her daughter, horseback riding, smart, funny. Individual's Preferences: "I want to figure out who i am off the antidepressants and see if im happy off them or do I need to go back on". Type of Services Patient Feels Are Needed: counseling  Mental Health Symptoms Depression:  Depression: Fatigue, Change in energy/activity, Irritability  Mania:  Mania: N/A  Anxiety:   Anxiety: Fatigue, Worrying, Irritability  Psychosis:  Psychosis: N/A  Trauma:  Trauma: N/A  Obsessions:  Obsessions: N/A  Compulsions:  Compulsions: N/A  Inattention:  Inattention: N/A  Hyperactivity/Impulsivity:  Hyperactivity/Impulsivity: N/A  Oppositional/Defiant Behaviors:  Oppositional/Defiant Behaviors: N/A  Borderline Personality:  Emotional Irregularity: N/A  Other Mood/Personality Symptoms:      Mental Status Exam Appearance and self-care  Stature:  Stature: Average  Weight:  Weight: Average weight  Clothing:  Clothing: Neat/clean  Grooming:  Grooming: Well-groomed  Cosmetic use:  Cosmetic Use: Age appropriate  Posture/gait:  Posture/Gait: Normal  Motor activity:  Motor Activity: Not Remarkable  Sensorium  Attention:  Attention: Normal  Concentration:  Concentration: Normal  Orientation:  Orientation: X5  Recall/memory:  Recall/Memory: Normal  Affect and Mood  Affect:  Affect: Appropriate  Mood:  Mood: Euthymic(at times sad, at times worried, at times irritable)  Relating  Eye contact:  Eye Contact: Normal  Facial expression:  Facial Expression: Responsive   Attitude toward examiner:  Attitude Toward Examiner: Cooperative  Thought and Language  Speech flow: Speech Flow: Normal  Thought content:  Thought Content: Appropriate to mood and circumstances  Preoccupation:     Hallucinations:     Organization:     Transport planner of Knowledge:  Fund of Knowledge: Average  Intelligence:  Intelligence: Above Average  Abstraction:  Abstraction: Normal  Judgement:  Judgement: Normal  Reality Testing:  Reality Testing: Adequate  Insight:  Insight: Good  Decision Making:  Decision Making: Normal  Social Functioning  Social Maturity:  Social Maturity: Responsible  Social Judgement:  Social Judgement: Normal  Stress  Stressors:  Stressors: (fatigue)  Coping Ability:  Coping Ability: Normal  Skill Deficits:     Supports:      Family and Psychosocial History: Family history Marital status: Long term relationship Long term relationship, how long?: pt is currently engaged to man she has been dating for couple of years- plan to marry jan 2021.  Pt was divorced several years ago and coparenting daughter w/ her ex. Additional relationship information: Pt reports that her and exhusband get along well, live close by to each other and coparent well together.  Are you sexually active?: Yes Does patient have children?: Yes How many children?: 1 How is patient's relationship with their children?: Pt reports that her relationship w/ [name] is great. Her daughter is 40y/o. No concerns.  This a a positive for her.   Childhood History:  Childhood History By whom was/is the patient raised?: Both parents Additional childhood history information: Pt has a younger brother.   Description of patient's relationship with caregiver when they were a child: pt reports her family has always been very close.  Patient's description of current relationship with people who raised him/her: pt reports mom is a support, but also wants to "fix" things for  her. Does patient have siblings?: Yes Description of patient's current relationship with siblings: Pt's brother lives close by and they see each other often.  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 Patient's job has been impacted by current illness: No Where was the patient employed at that time?: Pt worked several years for [employer] and really enjoyed her job.  Are There Guns or Other Weapons in [place]?: No  Education: Education Did Teacher, adult education From [employer]?: Yes Did You Attend College?: Yes What Type of College Degree Do you Have?: doctorate in History, Did Grand?: Yes Did You Have An Individualized Education Program (IIEP): No Did You Have Any Difficulty At School?: No  Religion: Religion/Spirituality Are You A Religious Person?: Yes What is Your Religious Affiliation?: English as a second language teacher: Leisure / Recreation  Leisure and Hobbies: Pt enjoys horseback riding,  enjoys traveling.   Exercise/Diet: Exercise/Diet Do You Exercise?: Yes What Type of Exercise Do You Do?: Other (Comment)(horseback riding, goign to gym) How Many Times a Week Do You Exercise?: 1-3 times a week Have You Gained or Lost A Significant Amount of Weight in the Past Six Months?: No Do You Follow a Special Diet?: No Do You Have Any Trouble Sleeping?: Yes Explanation of Sleeping  Difficulties: Pt difficulty initiating sleep w/out medication. pt wakes and doesn't feel rested.   CCA Part Two C  Alcohol/Drug Use: Alcohol / Drug Use Prescriptions: pt takes her medication as prescribed.  History of alcohol / drug use?: No history of alcohol / drug abuse                      CCA Part Three  ASAM's:  Six Dimensions of Multidimensional Assessment  Dimension 1:  Acute Intoxication and/or Withdrawal Potential:     Dimension 2:  Biomedical Conditions and Complications:     Dimension 3:  Emotional, Behavioral, or Cognitive Conditions and Complications:     Dimension 4:  Readiness to Change:     Dimension 5:  Relapse, Continued use, or Continued Problem Potential:     Dimension 6:  Recovery/Living Environment:      Substance use Disorder (SUD)    Social Function:  Social Functioning Social Maturity: Responsible Social Judgement: Normal  Stress:  Stress Stressors: (fatigue) Coping Ability: Normal Patient Takes Medications The Way The Doctor Instructed?: Yes  Risk Assessment- Self-Harm Potential: Risk Assessment For Self-Harm Potential Thoughts of Self-Harm: No current thoughts Method: No plan  Risk Assessment -Dangerous to Others Potential: Risk Assessment For Dangerous to Others Potential Method: No Plan Availability of Means: No access or NA  DSM5 Diagnoses: Patient Active Problem List   Diagnosis Date Noted  . Nephrolithiasis 10/31/2017  . Allergic rhinitis 08/16/2017  . Hyperlipidemia, mixed 08/16/2017  . Class 1 obesity with body mass index (BMI) of 32.0 to 32.9 in adult 08/16/2017  . Major depressive disorder, recurrent episode ([place]) 05/07/2015  . Weight gain 06/19/2014  . Depression 06/19/2014  . Routine general medical examination at a health care facility 06/19/2014  . Insomnia 06/19/2014    Patient Centered Plan: Patient is on the following Treatment Plan(s):  Depression  Recommendations for  Services/Supports/Treatments: Recommendations for Services/Supports/Treatments Recommendations For Services/Supports/Treatments: Individual Therapy, Medication Management  Treatment Plan Summary: OP Treatment Plan Summary: [facility] counseling    Benedict Kue

## 2018-04-26 ENCOUNTER — Ambulatory Visit (HOSPITAL_COMMUNITY): Payer: Self-pay | Admitting: Psychiatry

## 2018-06-19 ENCOUNTER — Ambulatory Visit (HOSPITAL_COMMUNITY): Payer: Commercial Managed Care - PPO | Admitting: Psychology

## 2018-06-29 ENCOUNTER — Encounter (HOSPITAL_COMMUNITY): Payer: Self-pay | Admitting: Psychiatry

## 2018-06-29 ENCOUNTER — Ambulatory Visit (INDEPENDENT_AMBULATORY_CARE_PROVIDER_SITE_OTHER): Payer: Commercial Managed Care - PPO | Admitting: Psychiatry

## 2018-06-29 VITALS — BP 122/70 | Ht 64.0 in | Wt 185.0 lb

## 2018-06-29 DIAGNOSIS — F324 Major depressive disorder, single episode, in partial remission: Secondary | ICD-10-CM

## 2018-06-29 MED ORDER — LORAZEPAM 1 MG PO TABS: ORAL_TABLET | ORAL | 4 refills | Status: DC

## 2018-06-29 MED ORDER — FLUOXETINE HCL 20 MG PO CAPS: 20.0000 mg | ORAL_CAPSULE | Freq: Every day | ORAL | 2 refills | Status: DC

## 2018-06-29 NOTE — Progress Notes (Signed)
Patient ID: Yolanda Wolfe, female   DOB: Jul 24, 1975, 43 y.o.   MRN: [id number] [facility] MD Progress Note  06/29/2018 9:04 AM [name]  MRN:  [id number] Subjective:  Spirits are good Principal Problem: Major depression recurrent This patient is seen in this office for major clinical depression.  Over the last year we have made attempts to discontinue her SSRIs.  She has done really well up until actually stopped the medicine.  When we were coming off Prozac she seemed to be having some withdrawal nausea.  But apparently that went away and then a month or 2 later she now is experiencing a mood change.  She describes a little to say it is daily depression with significant irritability.  Her sleep is no better no worse and her appetite is stable.  Her energy level is low.  But she clearly experiences a problem with tolerating her environment.  He feels overwhelmed.  Patient also has a problem sleeping.  She sees Dr. Elenore Rota as our consultant for sleep problems.  She does have a separate independent problem as insomnia.  Patient drinks no alcohol uses no drugs.  She is generally healthy.  Her daughter is doing very well.  She is a good relationship with her daughter and also with her ex-husband.  The patient plans to be married in about a year.  Her job is very stable from her review.  Patient is surprised that in fact she started feeling uncomfortable off the SSRI.  I shared with her the SSRIs or not dangers and that to take it on a long-term basis is certainly reasonable.  The patient is very agreeable to getting back on Prozac.  Patient Active Problem List   Diagnosis Date Noted  . Nephrolithiasis [N20.0] 10/31/2017  . Allergic rhinitis [J30.9] 08/16/2017  . Hyperlipidemia, mixed [E78.2] 08/16/2017  . Class 1 obesity with body mass index (BMI) of 32.0 to 32.9 in adult [E66.9, Z68.32] 08/16/2017  . Major depressive disorder, recurrent episode ([place]) [F33.9] 05/07/2015  . Weight gain  [R63.5] 06/19/2014  . Depression [F32.9] 06/19/2014  . Routine general medical examination at a health care facility [Z00.00] 06/19/2014  . Insomnia [G47.00] 06/19/2014   Total Time spent with patient: 15 minutes  Past Psychiatric History:   Past Medical History:  Past Medical History:  Diagnosis Date  . Anxiety   . Blood transfusion without reported diagnosis   . Depression   . Frequent headaches   . GERD (gastroesophageal reflux disease)   . Heart murmur   . Migraine   . UTI (lower urinary tract infection)     Past Surgical History:  Procedure Laterality Date  . ABDOMINAL HYSTERECTOMY  2009  . CESAREAN SECTION     Family History:  Family History  Problem Relation Age of Onset  . Cancer Maternal Grandmother        breast  . Heart disease Maternal Grandfather   . Alcohol abuse Paternal Grandfather   . Dementia Paternal Grandmother   . Cancer Mother   . Cancer Father    Family Psychiatric  History:  Social History:  Social History   Substance and Sexual Activity  Alcohol Use Yes  . Alcohol/week: 1.0 standard drinks  . Types: 1 Glasses of wine per week     Social History   Substance and Sexual Activity  Drug Use No    Social History   Socioeconomic History  . Marital status: Divorced    Spouse name: Not on file  .  Number of children: 1  . Years of education: Not on file  . Highest education level: Not on file  Occupational History  . Not on file  Social Needs  . Financial resource strain: Not on file  . Food insecurity:    Worry: Not on file    Inability: Not on file  . Transportation needs:    Medical: Not on file    Non-medical: Not on file  Tobacco Use  . Smoking status: Never Smoker  . Smokeless tobacco: Never Used  Substance and Sexual Activity  . Alcohol use: Yes    Alcohol/week: 1.0 standard drinks    Types: 1 Glasses of wine per week  . Drug use: No  . Sexual activity: Yes    Partners: Male    Birth control/protection: Surgical   Lifestyle  . Physical activity:    Days per week: Not on file    Minutes per session: Not on file  . Stress: Not on file  Relationships  . Social connections:    Talks on phone: Not on file    Gets together: Not on file    Attends religious service: Not on file    Active member of club or organization: Not on file    Attends meetings of clubs or organizations: Not on file    Relationship status: Not on file  Other Topics Concern  . Not on file  Social History Narrative  . Not on file   Additional Social History:                         Sleep: Poor  Appetite:  Good  Current Medications: Current Outpatient Medications  Medication Sig Dispense Refill  . LORazepam (ATIVAN) 1 MG tablet 2  qhs 60 tablet 4  . tazarotene (AVAGE) 0.1 % cream APPLY 1 APPLICATION APPLY ON THE SKIN NIGHTLY  11  . FLUoxetine (PROZAC) 20 MG capsule Take 1 capsule (20 mg total) by mouth daily. 90 capsule 2   No current facility-administered medications for this visit.     Lab Results: No results found for this or any previous visit (from the past 48 hour(s)).  Blood Alcohol level:  No results found for: [facility]  Physical Findings: AIMS:  , ,  ,  ,    CIWA:    COWS:     Musculoskeletal: Strength & Muscle Tone: within normal limits Gait & Station Patient leans: N/A  Psychiatric Specialty Exam: ROS  Blood pressure 122/70, height 5\' 4"  (1.626 m), weight 185 lb (83.9 kg).Body mass index is 31.76 kg/m.  General Appearance: Fairly Groomed  Engineer, water::  Good  Speech:  Clear and Coherent  Volume:  Normal  Mood:  Euthymic  Affect:  Appropriate  Thought Process:  Coherent  Orientation:  Full (Time, Place, and Person)  Thought Content:  WDL  Suicidal Thoughts:  No  Homicidal Thoughts:  No  Memory:  NA  Judgement:  Good  Insight:  Fair  Psychomotor Activity:  Normal  Concentration:  Good  Recall:  Good  Fund of Knowledge:Good  Language: Good  Akathisia:  No  Handed:  Right   AIMS (if indicated):     Assets:  Financial Resources/Insurance  ADL's:  Intact  Cognition: WNL  Sleep:      Treatment Plan Summary: At this time the patient will continue taking Zoloft 100 mg. Is not affecting her sex drive and I do not think is affecting her weight. She claims  that seems to feel that is working well. Now she sleeping fairly well taking Restoril 15 mg 2 at night. The patient will stay in therapy. The patient was given her for all to see Dr. Emi Belfast at [place]. This patient return to see me in 5 months.  Jerral Ralph, MD 06/29/2018, 9:04 AM Patient ID: [name], female   DOB: 1975-09-25, 43 y.o.   MRN: [id number] [facility] MD Progress Note  06/29/2018 9:04 AM Hanya Guerin  MRN:  [id number] Subjective:  Spirits are good Principal Problem: Major depression, recurrent episode Diagnosis:  Major depression, recurrent episode + Total Time spent with patient: 15 minutes  Past Psychiatric History:   Past Medical History:  Past Medical History:  Diagnosis Date  . Anxiety   . Blood transfusion without reported diagnosis   . Depression   . Frequent headaches   . GERD (gastroesophageal reflux disease)   . Heart murmur   . Migraine   . UTI (lower urinary tract infection)     Past Surgical History:  Procedure Laterality Date  . ABDOMINAL HYSTERECTOMY  2009  . CESAREAN SECTION     Family History:  Family History  Problem Relation Age of Onset  . Cancer Maternal Grandmother        breast  . Heart disease Maternal Grandfather   . Alcohol abuse Paternal Grandfather   . Dementia Paternal Grandmother   . Cancer Mother   . Cancer Father    Family Psychiatric  History:  Social History:  Social History   Substance and Sexual Activity  Alcohol Use Yes  . Alcohol/week: 1.0 standard drinks  . Types: 1 Glasses of wine per week     Social History   Substance and Sexual Activity  Drug Use No    Social History    Socioeconomic History  . Marital status: Divorced    Spouse name: Not on file  . Number of children: 1  . Years of education: Not on file  . Highest education level: Not on file  Occupational History  . Not on file  Social Needs  . Financial resource strain: Not on file  . Food insecurity:    Worry: Not on file    Inability: Not on file  . Transportation needs:    Medical: Not on file    Non-medical: Not on file  Tobacco Use  . Smoking status: Never Smoker  . Smokeless tobacco: Never Used  Substance and Sexual Activity  . Alcohol use: Yes    Alcohol/week: 1.0 standard drinks    Types: 1 Glasses of wine per week  . Drug use: No  . Sexual activity: Yes    Partners: Male    Birth control/protection: Surgical  Lifestyle  . Physical activity:    Days per week: Not on file    Minutes per session: Not on file  . Stress: Not on file  Relationships  . Social connections:    Talks on phone: Not on file    Gets together: Not on file    Attends religious service: Not on file    Active member of club or organization: Not on file    Attends meetings of clubs or organizations: Not on file    Relationship status: Not on file  Other Topics Concern  . Not on file  Social History Narrative  . Not on file   Additional Social History:  Sleep: Poor  Appetite:  Good  Current Medications: Current Outpatient Medications  Medication Sig Dispense Refill  . LORazepam (ATIVAN) 1 MG tablet 2  qhs 60 tablet 4  . tazarotene (AVAGE) 0.1 % cream APPLY 1 APPLICATION APPLY ON THE SKIN NIGHTLY  11  . FLUoxetine (PROZAC) 20 MG capsule Take 1 capsule (20 mg total) by mouth daily. 90 capsule 2   No current facility-administered medications for this visit.     Lab Results: No results found for this or any previous visit (from the past 48 hour(s)).  Blood Alcohol level:  No results found for: [facility]  Physical Findings: AIMS:  , ,  ,  ,    CIWA:    COWS:      Musculoskeletal: Strength & Muscle Tone: within normal limits Gait & Station Patient leans: N/A  Psychiatric Specialty Exam: ROS  Blood pressure 122/70, height 5\' 4"  (1.626 m), weight 185 lb (83.9 kg).Body mass index is 31.76 kg/m.  General Appearance: Fairly Groomed  Engineer, water::  Good  Speech:  Clear and Coherent  Volume:  Normal  Mood:  Euthymic  Affect:  Appropriate  Thought Process:  Coherent  Orientation:  Full (Time, Place, and Person)  Thought Content:  WDL  Suicidal Thoughts:  No  Homicidal Thoughts:  No  Memory:  NA  Judgement:  Good  Insight:  Fair  Psychomotor Activity:  Normal  Concentration:  Good  Recall:  Good  Fund of Knowledge:Good  Language: Good  Akathisia:  No  Handed:  Right  AIMS (if indicated):     Assets:  Financial Resources/Insurance  ADL's:  Intact  Cognition: WNL  Sleep:      Treatment Plan Summary  The patient's first problem is that of major clinical depression.  At this time I must believe there is evidence of recurrence.  Therefore this probably should be dealt with by guarding back to the medications it was working which was Prozac.  While she took other SSRIs I believe Prozac served her the best.  I suspect her mood will improve over the next month or 2 and she will return to see me months later.  The patient's second problem is that of insomnia.  The patient sees a sleep specialist Dr. Elenore Rota for this condition.  In conjunction with his opinion it is reasonable that she takes Ativan 1 mg.  However she continues to have interrupted sleep.  She claims Ativan is helpful but she still wakes 2 or 3 times at night yet she goes back to sleep.  Nonetheless she appropriately is requested the possibility of increasing it.  Today we will go ahead and increase her Ativan to 2 mg.  This will be the highest dose that I will go.  The patient was instructed to call us if there are problems but she will return to see [place] in 4 months back on the Prozac 20  mg and taking a higher dose of Ativan.  I would describe the symptoms of this patient is mild.  She shows no evidence of fatigue.  Her energy level is fairly good.  She is functioning very well.

## 2018-07-03 ENCOUNTER — Encounter: Payer: Self-pay | Admitting: Family Medicine

## 2018-07-03 ENCOUNTER — Ambulatory Visit (INDEPENDENT_AMBULATORY_CARE_PROVIDER_SITE_OTHER): Payer: Commercial Managed Care - PPO

## 2018-07-03 ENCOUNTER — Ambulatory Visit: Payer: Commercial Managed Care - PPO | Admitting: Family Medicine

## 2018-07-03 VITALS — BP 120/74 | HR 72 | Temp 98.2°F | Resp 12 | Ht 64.0 in | Wt 188.4 lb

## 2018-07-03 DIAGNOSIS — R1030 Lower abdominal pain, unspecified: Secondary | ICD-10-CM

## 2018-07-03 DIAGNOSIS — R109 Unspecified abdominal pain: Secondary | ICD-10-CM | POA: Diagnosis not present

## 2018-07-03 DIAGNOSIS — N3090 Cystitis, unspecified without hematuria: Secondary | ICD-10-CM

## 2018-07-03 DIAGNOSIS — N200 Calculus of kidney: Secondary | ICD-10-CM

## 2018-07-03 DIAGNOSIS — R319 Hematuria, unspecified: Secondary | ICD-10-CM

## 2018-07-03 LAB — POCT URINALYSIS DIPSTICK
Bilirubin, UA: NEGATIVE
GLUCOSE UA: NEGATIVE
KETONES UA: NEGATIVE
LEUKOCYTES UA: NEGATIVE
Nitrite, UA: NEGATIVE
Odor: NEGATIVE
Protein, UA: NEGATIVE
SPEC GRAV UA: 1.01 (ref 1.010–1.025)
Urobilinogen, UA: 0.2 E.U./dL
pH, UA: 6 (ref 5.0–8.0)

## 2018-07-03 MED ORDER — NITROFURANTOIN MONOHYD MACRO 100 MG PO CAPS: 100.0000 mg | ORAL_CAPSULE | Freq: Two times a day (BID) | ORAL | 0 refills | Status: AC

## 2018-07-03 NOTE — Patient Instructions (Signed)
A few things to remember from today's visit:   Hematuria, unspecified type - Plan: POC Urinalysis Dipstick, DG Abd 1 View  Lower abdominal pain - Plan: DG Abd 1 View  Cystitis - Plan: nitrofurantoin, macrocrystal-monohydrate, (MACROBID) 100 MG capsule  Nephrolithiasis    Adequate fluid intake, avoid holding urine for long hours, and over the counter Vit C OR cranberry capsules might help.  Today we will treat empirically with antibiotic, which we might need to change when urine culture comes back depending of bacteria susceptibility.  Seek immediate medical attention if severe abdominal pain, vomiting, fever/chills, or worsening symptoms. F/U if symptomatic are not any better after 2-3 days of antibiotic treatment.   Please be sure medication list is accurate. If a new problem present, please set up appointment sooner than planned today.

## 2018-07-03 NOTE — Progress Notes (Signed)
ACUTE VISIT   HPI:  Chief Complaint  Patient presents with  . Hematuria    for at least 24 hours    [name] is a 43 y.o. female, who is here today complaining of dark urine a few times since yesterday, she thinks it might be black. She denies associated dysuria, increasing urine frequency, urgency, or incontinence. Negative for fever, chills, or body aches. She also has mild lower abdominal cramps, constant, no radiated.  She has not identified exacerbating or alleviating factors. Denies nausea, vomiting, changes in bowel habits, vaginal bleeding or discharge. Last bowel movement yesterday.  Status post hysterectomy.  She has not tried OTC medication.  She recently resume Prozac for anxiety, she wonders if this could have caused urine color changes.  History of nephrolithiasis. Renal CT study done on 10/21/2016:  Mild to moderate left hydronephrosis and hydroureter, secondary to a 2 mm stone in the distal left ureter about a cm proximal to the left UVJ.There are no other acute abnormalities visualized.    Review of Systems  Constitutional: Negative for activity change, appetite change, fatigue and fever.  Gastrointestinal: Negative for abdominal pain, blood in stool, nausea and vomiting.  Genitourinary: Positive for hematuria. Negative for decreased urine volume, dysuria, frequency, urgency, vaginal bleeding and vaginal discharge.  Musculoskeletal: Negative for back pain and myalgias.  Hematological: Negative for adenopathy. Does not bruise/bleed easily.  Psychiatric/Behavioral: Negative for confusion. The patient is nervous/anxious.       Current Outpatient Medications on File Prior to Visit  Medication Sig Dispense Refill  . FLUoxetine (PROZAC) 20 MG capsule Take 1 capsule (20 mg total) by mouth daily. 90 capsule 2  . LORazepam (ATIVAN) 1 MG tablet 2  qhs 60 tablet 4  . tazarotene (AVAGE) 0.1 % cream APPLY 1 APPLICATION APPLY ON THE SKIN  NIGHTLY  11   No current facility-administered medications on file prior to visit.      Past Medical History:  Diagnosis Date  . Anxiety   . Blood transfusion without reported diagnosis   . Depression   . Frequent headaches   . GERD (gastroesophageal reflux disease)   . Heart murmur   . Migraine   . UTI (lower urinary tract infection)    No Known Allergies  Social History   Socioeconomic History  . Marital status: Divorced    Spouse name: Not on file  . Number of children: 1  . Years of education: Not on file  . Highest education level: Not on file  Occupational History  . Not on file  Social Needs  . Financial resource strain: Not on file  . Food insecurity:    Worry: Not on file    Inability: Not on file  . Transportation needs:    Medical: Not on file    Non-medical: Not on file  Tobacco Use  . Smoking status: Never Smoker  . Smokeless tobacco: Never Used  Substance and Sexual Activity  . Alcohol use: Yes    Alcohol/week: 1.0 standard drinks    Types: 1 Glasses of wine per week  . Drug use: No  . Sexual activity: Yes    Partners: Male    Birth control/protection: Surgical  Lifestyle  . Physical activity:    Days per week: Not on file    Minutes per session: Not on file  . Stress: Not on file  Relationships  . Social connections:    Talks on phone: Not on file  Gets together: Not on file    Attends religious service: Not on file    Active member of club or organization: Not on file    Attends meetings of clubs or organizations: Not on file    Relationship status: Not on file  Other Topics Concern  . Not on file  Social History Narrative  . Not on file    Vitals:   07/03/18 1059  BP: 120/74  Pulse: 72  Resp: 12  Temp: 98.2 F (36.8 C)  SpO2: 99%   Body mass index is 32.33 kg/m.   Physical Exam  Nursing note and vitals reviewed. Constitutional: She is oriented to person, place, and time. She appears well-developed. No distress.    HENT:  Head: Normocephalic and atraumatic.  Mouth/Throat: Oropharynx is clear and moist and mucous membranes are normal.  Eyes: Conjunctivae are normal.  Cardiovascular: Normal rate and regular rhythm.  Respiratory: Effort normal and breath sounds normal. No respiratory distress.  GI: Soft. She exhibits no mass. There is abdominal tenderness in the suprapubic area. There is rebound. There is no guarding and no CVA tenderness.  Musculoskeletal:        General: No edema.  Neurological: She is alert and oriented to person, place, and time. She has normal strength. Gait normal.  Skin: Skin is warm. No erythema.  Psychiatric: She has a normal mood and affect.  Well-groomed, good eye contact.      ASSESSMENT AND PLAN:  [name] was seen today for hematuria.  Diagnoses and all orders for this visit:  Hematuria, unspecified type Urine dipstick today negative except for 3+ blood. We discussed possible etiologies, most likely related to nephrolithiasis but cystitis needs to be considered. Instructed about warning signs.  -     POC Urinalysis Dipstick -     DG Abd 1 View; Future -     Urine culture  Lower abdominal pain Today abdominal examination does not suggest a serious process. KUB ordered today. Instructed about warning signs. Follow-up with PCP as needed.  -     DG Abd 1 View; Future -     Urine culture  Cystitis We will treat empirically as UTI. We will tailor treatment according to urine culture results.  -     nitrofurantoin, macrocrystal-monohydrate, (MACROBID) 100 MG capsule; Take 1 capsule (100 mg total) by mouth 2 (two) times daily for 5 days.  Nephrolithiasis KUB ordered today. If problem is recurrent she needs to consider establishing with urologist. Adequate hydration. Instructed about warning signs.      Return if symptoms worsen or fail to improve.     -[name] was advised to seek immediate medical attention if sudden  worsening symptoms or to follow if they persist or if new concerns arise.       Melissa Pulido G. Martinique, MD  [facility]. [place] office.

## 2018-07-04 LAB — URINE CULTURE
MICRO NUMBER:: 53035
SPECIMEN QUALITY:: ADEQUATE

## 2018-07-13 ENCOUNTER — Telehealth: Payer: Self-pay | Admitting: Family Medicine

## 2018-07-13 NOTE — Telephone Encounter (Signed)
Copied from [place] [id number]. Topic: Referral - Request for Referral >> Jul 13, 2018  4:08 PM Margot Ables wrote: Has patient seen PCP for this complaint? Yes 07/03/2018 with Dr. Martinique *If NO, is insurance requiring patient see PCP for this issue before PCP can refer them? Referral for which specialty: Urology Preferred provider/office: [place] Reason for referral: frequent urination, pain during urination and other times, darker than normal urine

## 2018-07-13 NOTE — Telephone Encounter (Signed)
Referral to urologist can be placed as requested. . Thanks, Patient

## 2018-07-13 NOTE — Telephone Encounter (Signed)
Message sent to Dr. Jordan for review. 

## 2018-07-14 ENCOUNTER — Ambulatory Visit: Payer: Self-pay | Admitting: Family Medicine

## 2018-07-16 ENCOUNTER — Other Ambulatory Visit: Payer: Self-pay | Admitting: *Deleted

## 2018-07-16 ENCOUNTER — Ambulatory Visit (INDEPENDENT_AMBULATORY_CARE_PROVIDER_SITE_OTHER): Payer: Commercial Managed Care - PPO | Admitting: Psychology

## 2018-07-16 DIAGNOSIS — R350 Frequency of micturition: Secondary | ICD-10-CM

## 2018-07-16 DIAGNOSIS — F330 Major depressive disorder, recurrent, mild: Secondary | ICD-10-CM

## 2018-07-16 DIAGNOSIS — R82998 Other abnormal findings in urine: Secondary | ICD-10-CM

## 2018-07-16 NOTE — Telephone Encounter (Signed)
Referral to Urology placed as requested.

## 2018-07-16 NOTE — Progress Notes (Signed)
   THERAPIST PROGRESS NOTE  Session Time: 2.35pm-3.14pm  Participation Level: Active  Behavioral Response: Well GroomedAlertaffect wnl  Type of Therapy: Individual Therapy  Treatment Goals addressed: Diagnosis: MDD and goal 1.  Interventions: CBT and Supportive  Summary: Yolanda Wolfe is a 43 y.o. female who presents with affect wnl.  Pt reported that couple weeks ago she restarted on medication for depression and feels that already benefiting.  Pt recognized that she was just easily overwhelmed, irritable and not feeling herself.  Pt reported that the littlest things would make her cry and wasn't able to focus on prioritizing.  Pt reports she has been feeling a little ease w/ this for the past week. Pt reports that she has been anxious about her horseback riding- not always feeling up to going or able to but then feeling like she should because paying for and her "fun thing". Pt was able to give permission to self that if she needs to back off on or not enjoying like did in past that is ok. 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  Pt recognized likely normal jitters for taking on new and unknown and that w/ more practice will feel more at ease.  Pt discussed upcoming that continuing to transition her home to her fiances and feeling like home is a stressor. .   Suicidal/Homicidal: Nowithout intent/plan  Therapist Response: Assessed pt current functioning per pt report. Processed w/pt coping w/ stressors, anxiety and worries.  Explored w/pt reframing and giving self permission to back off when needed- not create more expectations for her things that are self care.  Discussed pt coping skills and normalized transitions and progress making reflected.   Plan: Return again in 3 weeks.  Diagnosis: MDD   [name], [facility] 07/16/2018

## 2018-08-07 ENCOUNTER — Ambulatory Visit (INDEPENDENT_AMBULATORY_CARE_PROVIDER_SITE_OTHER): Payer: Commercial Managed Care - PPO | Admitting: Psychology

## 2018-08-07 DIAGNOSIS — F330 Major depressive disorder, recurrent, mild: Secondary | ICD-10-CM | POA: Diagnosis not present

## 2018-08-07 NOTE — Progress Notes (Signed)
   THERAPIST PROGRESS NOTE  Session Time: 9.07am-9.43am  Participation Level: Active  Behavioral Response: Well GroomedAlertaffect bright  Type of Therapy: Individual Therapy  Treatment Goals addressed: Diagnosis: MDd and goal 1.  Interventions: CBT and Supportive  Summary: Yolanda Wolfe is a 43 y.o. female who presents with affect wnl.  Pt reported that she has been doing well overall.  Pt reported last week started ruminating on responsibilities w/ work and taking on more and started to feel slightly overwhelmed- but was able to reframe and cope through.  Pt reported that she enjoyed some time off last week for family vacation although daughter got sick at end of week. Pt reported that she didn't feel like returning yesterday after vacation but recognized this as norm and was able to return and overcome.  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  Pt reported she feels in a good place overall.     Suicidal/Homicidal: Nowithout intent/plan  Therapist Response: Assessed pt current functioning per pt report. Processed w/pt coping w/ stressors and assisted w/ reframing and recognizing and shifting w/ ruminating.  Explored w/pt her supports and ways of seeking help as needed.  Plan: Return again in 3 weeks.  Diagnosis: MDD, mild   Donnelle Rubey, LPC 08/07/2018

## 2018-08-13 ENCOUNTER — Encounter: Payer: Self-pay | Admitting: Family Medicine

## 2018-08-13 ENCOUNTER — Ambulatory Visit: Payer: Commercial Managed Care - PPO | Admitting: Family Medicine

## 2018-08-13 VITALS — BP 118/70 | HR 65 | Temp 98.2°F | Resp 12 | Ht 64.0 in | Wt 183.0 lb

## 2018-08-13 DIAGNOSIS — J029 Acute pharyngitis, unspecified: Secondary | ICD-10-CM

## 2018-08-13 LAB — POCT RAPID STREP A (OFFICE): RAPID STREP A SCREEN: NEGATIVE

## 2018-08-13 MED ORDER — MAGIC MOUTHWASH W/LIDOCAINE: 5.0000 mL | Freq: Three times a day (TID) | ORAL | 0 refills | Status: AC | PRN

## 2018-08-13 NOTE — Progress Notes (Signed)
ACUTE VISIT  HPI:  Chief Complaint  Patient presents with  . Headache  . Sore Throat    started 3 or 4 days ago    Yolanda Wolfe is a 43 y.o.female here today complaining of 3-4 days of sore throat. Mild nonproductive cough. She has not noted fever, chills, body aches, or decreased appetite.  Pain is sharp,constant, exacerbated by swallowing. No alleviating factors identified. She denies any stridor and dysphagia.  Yesterday she had frontal, constant, "bad" headache, it is better today. Mild nausea. Negative for associated visual changes, vomiting, or focal deficit.   Sore Throat   This is a new problem. The current episode started in the past 7 days. The problem has been unchanged. There has been no fever. The pain is moderate. Associated symptoms include coughing and headaches. Pertinent negatives include no abdominal pain, congestion, diarrhea, ear pain, hoarse voice, plugged ear sensation, neck pain, shortness of breath, stridor, swollen glands, trouble swallowing or vomiting. She has had no exposure to strep or mono. The treatment provided mild relief.   No Hx of recent overseas travel.  She was in [place], [place], about 2 weeks ago. Her daughter was diagnosed with influenza A on 08/03/2018. No known insect bite.  OTC medications for this problem: She took Excedrin Migraine for headache yesterday.  Review of Systems  Constitutional: Positive for fatigue. Negative for activity change, appetite change and fever.  HENT: Positive for postnasal drip, sinus pressure and sore throat. Negative for congestion, ear pain, hoarse voice, mouth sores, trouble swallowing and voice change.   Eyes: Negative for discharge, redness and itching.  Respiratory: Positive for cough. Negative for shortness of breath, wheezing and stridor.   Gastrointestinal: Negative for abdominal pain, diarrhea, nausea and vomiting.  Musculoskeletal: Negative for myalgias and neck  pain.  Skin: Negative for rash.  Allergic/Immunologic: Positive for environmental allergies (allergic rhinitis).  Neurological: Positive for headaches. Negative for facial asymmetry, weakness and numbness.  Hematological: Negative for adenopathy. Does not bruise/bleed easily.   Current Outpatient Medications on File Prior to Visit  Medication Sig Dispense Refill  . FLUoxetine (PROZAC) 20 MG capsule Take 1 capsule (20 mg total) by mouth daily. 90 capsule 2  . LORazepam (ATIVAN) 1 MG tablet 2  qhs 60 tablet 4  . tazarotene (AVAGE) 0.1 % cream APPLY 1 APPLICATION APPLY ON THE SKIN NIGHTLY  11   No current facility-administered medications on file prior to visit.      Past Medical History:  Diagnosis Date  . Anxiety   . Blood transfusion without reported diagnosis   . Depression   . Frequent headaches   . GERD (gastroesophageal reflux disease)   . Heart murmur   . Migraine   . UTI (lower urinary tract infection)    No Known Allergies  Social History   Socioeconomic History  . Marital status: Divorced    Spouse name: Not on file  . Number of children: 1  . Years of education: Not on file  . Highest education level: Not on file  Occupational History  . Not on file  Social Needs  . Financial resource strain: Not on file  . Food insecurity:    Worry: Not on file    Inability: Not on file  . Transportation needs:    Medical: Not on file    Non-medical: Not on file  Tobacco Use  . Smoking status: Never Smoker  . Smokeless tobacco: Never Used  Substance and  Sexual Activity  . Alcohol use: Yes    Alcohol/week: 1.0 standard drinks    Types: 1 Glasses of wine per week  . Drug use: No  . Sexual activity: Yes    Partners: Male    Birth control/protection: Surgical  Lifestyle  . Physical activity:    Days per week: Not on file    Minutes per session: Not on file  . Stress: Not on file  Relationships  . Social connections:    Talks on phone: Not on file    Gets  together: Not on file    Attends religious service: Not on file    Active member of club or organization: Not on file    Attends meetings of clubs or organizations: Not on file    Relationship status: Not on file  Other Topics Concern  . Not on file  Social History Narrative  . Not on file    Vitals:   08/13/18 1652  BP: 118/70  Pulse: 65  Resp: 12  Temp: 98.2 F (36.8 C)  SpO2: 99%   Body mass index is 31.41 kg/m.   Physical Exam  Nursing note and vitals reviewed. Constitutional: She is oriented to person, place, and time. She appears well-developed. She does not appear ill. No distress.  HENT:  Head: Normocephalic and atraumatic.  Nose: Right sinus exhibits no maxillary sinus tenderness and no frontal sinus tenderness. Left sinus exhibits no maxillary sinus tenderness and no frontal sinus tenderness.  Mouth/Throat: Uvula is midline and mucous membranes are normal. Posterior oropharyngeal erythema present. No oropharyngeal exudate or posterior oropharyngeal edema.  Postnasal drainage.  Eyes: Conjunctivae are normal.  Cardiovascular: Normal rate and regular rhythm.  No murmur heard. Respiratory: Effort normal and breath sounds normal. No respiratory distress.  Lymphadenopathy:       Head (right side): No submandibular adenopathy present.       Head (left side): No submandibular adenopathy present.    She has cervical adenopathy.       Right cervical: Posterior cervical adenopathy present.       Left cervical: Posterior cervical adenopathy present.  Neurological: She is alert and oriented to person, place, and time. She has normal strength.  Skin: Skin is warm. No rash noted. No erythema.  Psychiatric: She has a normal mood and affect.  Well groomed, good eye contact.   ASSESSMENT AND PLAN:  [name] was seen today for headache and sore throat.  Diagnoses and all orders for this visit:  Acute pharyngitis, unspecified etiology -     POC Rapid Strep A -      Culture, Group A Strep -     magic mouthwash w/lidocaine SOLN; Take 5 mLs by mouth 3 (three) times daily as needed for up to 10 days for mouth pain. 50 ml of diphenhydramine, alum and mag hydroxide, and lidocaine to make 150 ml   We discussed possible etiologies, most likely viral. Rapid strep here in the office negative, will follow culture. Symptomatic treatment with Magic mouthwash with lidocaine recommended. Also OTC throat lozenges, gargles with saline, and/or acetaminophen 500 mg 3-4 times per day as needed. Instructed about warning signs. Follow-up as needed.     Return if symptoms worsen or fail to improve.      Darria Corvera G. [place], MD  [facility]. [place] office.

## 2018-08-13 NOTE — Patient Instructions (Signed)
A few things to remember from today's visit:   Sore throat - Plan: POC Rapid Strep A, Culture, Group A Strep, magic mouthwash w/lidocaine SOLN   Symptomatic treatment: Over the counter Acetaminophen 500 mg and/or Ibuprofen (400-600 mg) if there is not contraindications; you can alternate in between both every 4-6 hours. Gargles with saline water and throat lozenges might also help. Cold fluids.    Seek prompt medical evaluation if you are having difficulty breathing, mouth swelling, throat closing up, not able to swallow liquids (drooling), skin rash/bruising, or worsening symptoms.  Please follow up in 2 weeks if not any better.     Please be sure medication list is accurate. If a new problem present, please set up appointment sooner than planned today.

## 2018-08-15 LAB — CULTURE, GROUP A STREP
MICRO NUMBER:: 233356
SPECIMEN QUALITY: ADEQUATE

## 2018-08-16 ENCOUNTER — Ambulatory Visit (HOSPITAL_COMMUNITY): Payer: Commercial Managed Care - PPO | Admitting: Psychology

## 2018-08-28 ENCOUNTER — Ambulatory Visit (HOSPITAL_COMMUNITY): Payer: Commercial Managed Care - PPO | Admitting: Psychology

## 2018-09-19 ENCOUNTER — Ambulatory Visit (HOSPITAL_COMMUNITY): Payer: Commercial Managed Care - PPO | Admitting: Psychology

## 2018-10-24 ENCOUNTER — Other Ambulatory Visit: Payer: Self-pay

## 2018-10-24 ENCOUNTER — Ambulatory Visit (INDEPENDENT_AMBULATORY_CARE_PROVIDER_SITE_OTHER): Payer: Commercial Managed Care - PPO | Admitting: Psychology

## 2018-10-24 DIAGNOSIS — F330 Major depressive disorder, recurrent, mild: Secondary | ICD-10-CM

## 2018-10-24 NOTE — Progress Notes (Signed)
Virtual Visit via Telephone Note  I connected with Yolanda Wolfe on 10/24/18 at  3:30 PM EDT by telephone and verified that I am speaking with the correct person using two identifiers.   I discussed the limitations, risks, security and privacy concerns of performing an evaluation and management service by telephone and the availability of in person appointments. I also discussed with the patient that there may be a patient responsible charge related to this service. The patient expressed understanding and agreed to proceed.  I provided 38 minutes of non-face-to-face time during this encounter.   Yolanda Wolfe [facility]    THERAPIST PROGRESS NOTE  Session Time: 3.29pm-4.07pm  Participation Level: Active  Behavioral Response: Alertaffect wnl  Type of Therapy: Individual Therapy  Treatment Goals addressed: Diagnosis: MDD and goal 1  Interventions: CBT and Strength-based  Summary: Yolanda Wolfe is a 43 y.o. female who presents with affect wnl.  Pt reported that hard to express how she feels as can be day to day.  Pt reported that school was on spring break when all the social distancing started and decided to extend spring break a couple of days and went straight into distance learning.  Pt reported that she has several meeting every day still that she has done ok w/ keeping engaged w/ work from home.  Pt reported that her daughter has seemlessly transitioned to home schooling and have maintained her same schedule between pt in dads.  Pt reported that she was able to identify w/ how others are expressing that we are grieving and pt struggles w/ uncertainty and how that plays on her anxiety.  Pt is being mindful and reframing.  Pt discussed that couple of weeks ago she was struggling as was also social distancing from fiance and they would still have parking lot meet ups but decided that emotionally wasn't working and so they have resumed contact- which has helped her a lot.   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    Suicidal/Homicidal: Nowithout intent/plan  Therapist Response: Assessed pt current functioning per pt report. Processed w/pt coping w/ transitions and changes w/ social distancing and covid 19.  Discussed positive steps and reframes pt has been mindful of.  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  Plan: Return again in 4 weeks, via webex.  discussed the assessment and treatment plan with the patient. The patient was provided an opportunity to ask questions and all were answered. The patient agreed with the plan and demonstrated an understanding of the instructions.   The patient was advised to call back or seek an in-person evaluation if the symptoms worsen or if the condition fails to improve as anticipated.  Diagnosis: MDD- mild   Yolanda Wolfe, [facility] 10/24/2018

## 2018-10-31 ENCOUNTER — Ambulatory Visit (INDEPENDENT_AMBULATORY_CARE_PROVIDER_SITE_OTHER): Payer: Commercial Managed Care - PPO | Admitting: Psychiatry

## 2018-10-31 ENCOUNTER — Other Ambulatory Visit: Payer: Self-pay

## 2018-10-31 ENCOUNTER — Encounter (HOSPITAL_COMMUNITY): Payer: Self-pay | Admitting: Psychiatry

## 2018-10-31 DIAGNOSIS — F339 Major depressive disorder, recurrent, unspecified: Secondary | ICD-10-CM | POA: Diagnosis not present

## 2018-10-31 DIAGNOSIS — F325 Major depressive disorder, single episode, in full remission: Secondary | ICD-10-CM

## 2018-10-31 DIAGNOSIS — G4700 Insomnia, unspecified: Secondary | ICD-10-CM

## 2018-10-31 MED ORDER — LORAZEPAM 1 MG PO TABS
ORAL_TABLET | ORAL | 4 refills | Status: DC
Start: 2018-10-31 — End: 2019-04-03

## 2018-10-31 MED ORDER — FLUOXETINE HCL 20 MG PO CAPS: 20.0000 mg | ORAL_CAPSULE | Freq: Every day | ORAL | 2 refills | Status: DC

## 2018-10-31 NOTE — Progress Notes (Signed)
Patient ID: Yolanda Wolfe, female   DOB: June 29, 1975, 43 y.o.   MRN: [id number] [facility] MD Progress Note  10/31/2018 3:31 PM Kimela Malstrom  MRN:  [id number] Subjective: Major depression Principal Problem: Major depression recurrent  At this time the patient is doing very well.  She is back on Prozac 20 mg and she feels like she is back to herself.  She takes Ativan and finds that if she takes 2 mg that shows 2 months.  When she takes 1 mg 1 or 1-1/2 she feels a good night's sleep.  The patient is eating well.  She is working from home.  She likes her job.  She still plans to get married in January.  Her energy level is good.  She has no problems thinking concentrating.  She is a good sense of worth and value.  She certainly is not suicidal.  She drinks no alcohol uses no drugs.  She has no evidence of psychosis and never has.  Patient is very stable.  At this time it is reasonable to keep her on Prozac indefinitely.  She has no physical complaints and no emotional complaints at this time.  She has no fatigue.  She is functioning extremely well.    Patient Active Problem List   Diagnosis Date Noted  . Nephrolithiasis [N20.0] 10/31/2017  . Allergic rhinitis [J30.9] 08/16/2017  . Hyperlipidemia, mixed [E78.2] 08/16/2017  . Class 1 obesity with body mass index (BMI) of 32.0 to 32.9 in adult [E66.9, Z68.32] 08/16/2017  . Major depressive disorder, recurrent episode ([place]) [F33.9] 05/07/2015  . Weight gain [R63.5] 06/19/2014  . Depression [F32.9] 06/19/2014  . Routine general medical examination at a health care facility [Z00.00] 06/19/2014  . Insomnia [G47.00] 06/19/2014   Total Time spent with patient: 15 minutes  Past Psychiatric History:   Past Medical History:  Past Medical History:  Diagnosis Date  . Anxiety   . Blood transfusion without reported diagnosis   . Depression   . Frequent headaches   . GERD (gastroesophageal reflux disease)   . Heart murmur   . Migraine    . UTI (lower urinary tract infection)     Past Surgical History:  Procedure Laterality Date  . ABDOMINAL HYSTERECTOMY  2009  . CESAREAN SECTION     Family History:  Family History  Problem Relation Age of Onset  . Cancer Maternal Grandmother        breast  . Heart disease Maternal Grandfather   . Alcohol abuse Paternal Grandfather   . Dementia Paternal Grandmother   . Cancer Mother   . Cancer Father    Family Psychiatric  History:  Social History:  Social History   Substance and Sexual Activity  Alcohol Use Yes  . Alcohol/week: 1.0 standard drinks  . Types: 1 Glasses of wine per week     Social History   Substance and Sexual Activity  Drug Use No    Social History   Socioeconomic History  . Marital status: Divorced    Spouse name: Not on file  . Number of children: 1  . Years of education: Not on file  . Highest education level: Not on file  Occupational History  . Not on file  Social Needs  . Financial resource strain: Not on file  . Food insecurity:    Worry: Not on file    Inability: Not on file  . Transportation needs:    Medical: Not on file    Non-medical: Not on file  Tobacco Use  . Smoking status: Never Smoker  . Smokeless tobacco: Never Used  Substance and Sexual Activity  . Alcohol use: Yes    Alcohol/week: 1.0 standard drinks    Types: 1 Glasses of wine per week  . Drug use: No  . Sexual activity: Yes    Partners: Male    Birth control/protection: Surgical  Lifestyle  . Physical activity:    Days per week: Not on file    Minutes per session: Not on file  . Stress: Not on file  Relationships  . Social connections:    Talks on phone: Not on file    Gets together: Not on file    Attends religious service: Not on file    Active member of club or organization: Not on file    Attends meetings of clubs or organizations: Not on file    Relationship status: Not on file  Other Topics Concern  . Not on file  Social History Narrative  .  Not on file   Additional Social History:                         Sleep: Poor  Appetite:  Good  Current Medications: Current Outpatient Medications  Medication Sig Dispense Refill  . FLUoxetine (PROZAC) 20 MG capsule Take 1 capsule (20 mg total) by mouth daily. 90 capsule 2  . LORazepam (ATIVAN) 1 MG tablet 1 and a  Half qhs 45 tablet 4  . tazarotene (AVAGE) 0.1 % cream APPLY 1 APPLICATION APPLY ON THE SKIN NIGHTLY  11   No current facility-administered medications for this visit.     Lab Results: No results found for this or any previous visit (from the past 48 hour(s)).  Blood Alcohol level:  No results found for: [facility]  Physical Findings: AIMS:  , ,  ,  ,    CIWA:    COWS:     Musculoskeletal: Strength & Muscle Tone: within normal limits Gait & Station Patient leans: N/A  Psychiatric Specialty Exam: ROS  There were no vitals taken for this visit.There is no height or weight on file to calculate BMI.  General Appearance: Fairly Groomed  Engineer, water::  Good  Speech:  Clear and Coherent  Volume:  Normal  Mood:  Euthymic  Affect:  Appropriate  Thought Process:  Coherent  Orientation:  Full (Time, Place, and Person)  Thought Content:  WDL  Suicidal Thoughts:  No  Homicidal Thoughts:  No  Memory:  NA  Judgement:  Good  Insight:  Fair  Psychomotor Activity:  Normal  Concentration:  Good  Recall:  Good  Fund of Knowledge:Good  Language: Good  Akathisia:  No  Handed:  Right  AIMS (if indicated):     Assets:  Financial Resources/Insurance  ADL's:  Intact  Cognition: WNL  Sleep:      Treatment Plan Summary: At this time the patient will continue taking Zoloft 100 mg. Is not affecting her sex drive and I do not think is affecting her weight. She claims that seems to feel that is working well. Now she sleeping fairly well taking Restoril 15 mg 2 at night. The patient will stay in therapy. The patient was given her for all to see Dr. Emi Belfast at  [place]. This patient return to see me in 5 months.  Jerral Ralph, MD 10/31/2018, 3:31 PM Patient ID: Yolanda Wolfe, female   DOB: [birth year], 43 y.o.   MRN:  [id number] [facility] MD Progress Note  10/31/2018 3:31 PM Kegan Shepardson  MRN:  [id number] Subjective:  Spirits are good Principal Problem: Major depression, recurrent episode Diagnosis:  Major depression, recurrent episode + Total Time spent with patient: 15 minutes  Past Psychiatric History:   Past Medical History:  Past Medical History:  Diagnosis Date  . Anxiety   . Blood transfusion without reported diagnosis   . Depression   . Frequent headaches   . GERD (gastroesophageal reflux disease)   . Heart murmur   . Migraine   . UTI (lower urinary tract infection)     Past Surgical History:  Procedure Laterality Date  . ABDOMINAL HYSTERECTOMY  2009  . CESAREAN SECTION     Family History:  Family History  Problem Relation Age of Onset  . Cancer Maternal Grandmother        breast  . Heart disease Maternal Grandfather   . Alcohol abuse Paternal Grandfather   . Dementia Paternal Grandmother   . Cancer Mother   . Cancer Father    Family Psychiatric  History:  Social History:  Social History   Substance and Sexual Activity  Alcohol Use Yes  . Alcohol/week: 1.0 standard drinks  . Types: 1 Glasses of wine per week     Social History   Substance and Sexual Activity  Drug Use No    Social History   Socioeconomic History  . Marital status: Divorced    Spouse name: Not on file  . Number of children: 1  . Years of education: Not on file  . Highest education level: Not on file  Occupational History  . Not on file  Social Needs  . Financial resource strain: Not on file  . Food insecurity:    Worry: Not on file    Inability: Not on file  . Transportation needs:    Medical: Not on file    Non-medical: Not on file  Tobacco Use  . Smoking status: Never Smoker  . Smokeless tobacco:  Never Used  Substance and Sexual Activity  . Alcohol use: Yes    Alcohol/week: 1.0 standard drinks    Types: 1 Glasses of wine per week  . Drug use: No  . Sexual activity: Yes    Partners: Male    Birth control/protection: Surgical  Lifestyle  . Physical activity:    Days per week: Not on file    Minutes per session: Not on file  . Stress: Not on file  Relationships  . Social connections:    Talks on phone: Not on file    Gets together: Not on file    Attends religious service: Not on file    Active member of club or organization: Not on file    Attends meetings of clubs or organizations: Not on file    Relationship status: Not on file  Other Topics Concern  . Not on file  Social History Narrative  . Not on file   Additional Social History:                         Sleep: Poor  Appetite:  Good  Current Medications: Current Outpatient Medications  Medication Sig Dispense Refill  . FLUoxetine (PROZAC) 20 MG capsule Take 1 capsule (20 mg total) by mouth daily. 90 capsule 2  . LORazepam (ATIVAN) 1 MG tablet 1 and a  Half qhs 45 tablet 4  . tazarotene (AVAGE) 0.1 % cream APPLY 1 APPLICATION APPLY  ON THE SKIN NIGHTLY  11   No current facility-administered medications for this visit.     Lab Results: No results found for this or any previous visit (from the past 48 hour(s)).  Blood Alcohol level:  No results found for: [facility]  Physical Findings: AIMS:  , ,  ,  ,    CIWA:    COWS:     Musculoskeletal: Strength & Muscle Tone: within normal limits Gait & Station Patient leans: N/A  Psychiatric Specialty Exam: ROS  There were no vitals taken for this visit.There is no height or weight on file to calculate BMI.  General Appearance: Fairly Groomed  Engineer, water::  Good  Speech:  Clear and Coherent  Volume:  Normal  Mood:  Euthymic  Affect:  Appropriate  Thought Process:  Coherent  Orientation:  Full (Time, Place, and Person)  Thought Content:  WDL   Suicidal Thoughts:  No  Homicidal Thoughts:  No  Memory:  NA  Judgement:  Good  Insight:  Fair  Psychomotor Activity:  Normal  Concentration:  Good  Recall:  Good  Fund of Knowledge:Good  Language: Good  Akathisia:  No  Handed:  Right  AIMS (if indicated):     Assets:  Financial Resources/Insurance  ADL's:  Intact  Cognition: WNL  Sleep:      Treatment Plan Summary  This patient has 2 problems.  Her first problem is that of major depression which is in remission at this time.  She will continue taking Prozac 20 mg.  Her second problem is insomnia.  The patient is under the care of Dr. Orlie Pollen but for me she receives 2 mg of Ativan.  She actually only takes 1-1/2 mg at night which helps her sleep.  She says with this sleeping aid together with what she is burning from Dr. Elenore Rota she is sleeping well.  She is functioning very well.  She likes what she does for living.  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  Her parents help out.  This patient she will return to see me in 5 months.

## 2018-11-20 ENCOUNTER — Ambulatory Visit (INDEPENDENT_AMBULATORY_CARE_PROVIDER_SITE_OTHER): Payer: Commercial Managed Care - PPO | Admitting: Psychology

## 2018-11-20 ENCOUNTER — Other Ambulatory Visit: Payer: Self-pay

## 2018-11-20 DIAGNOSIS — F330 Major depressive disorder, recurrent, mild: Secondary | ICD-10-CM | POA: Diagnosis not present

## 2018-11-20 NOTE — Progress Notes (Signed)
Virtual Visit via Video Note  I connected with Yolanda Wolfe on 11/20/18 at 10:00 AM EDT by a video enabled telemedicine application and verified that I am speaking with the correct person using two identifiers.   I discussed the limitations of evaluation and management by telemedicine and the availability of in person appointments. The patient expressed understanding and agreed to proceed.  I provided 36 minutes of non-face-to-face time during this encounter.   [name] [facility]    THERAPIST PROGRESS NOTE  Session Time: 10am-10.36am  Participation Level: Active  Behavioral Response: Well GroomedAlertaffect wnl  Type of Therapy: Individual Therapy  Treatment Goals addressed: Diagnosis: MDD and goal 1.  Interventions: CBT and Strength-based  Summary: Yolanda Wolfe is a 43 y.o. female who presents with affect wnl.  Pt reported that she is feeling some down w/ continued pandemic- issues w/ political issues in the world.  Pt reports feeling sense of not wanting to do much as not able to do the things would like.  Pt reports good insight into not everything is bad or negative.  Pt reported on some positive w/ job and boss seeking direction from her for next year.  Pt reported that she is having some feelings creep up about her relationship- feeling blah about and not sure if just spilling over from other feelings.  Pt acknowledged can be norm to have negatives in relationship and not always feeling like crush and magical.  Pt is reframing and focusing on things she can do in her control.   Suicidal/Homicidal: Nowithout intent/plan  Therapist Response: Assessed pt current functioning per pt report. Processed w/pt coping w/ her feelings w/ continued stressors of pandemic and changes have brought w/ day to day.  Explored w/pt feelings and assisted w/ reframing and awareness of norms.  Discussed w/ pt coping skills and focus on what is in her control.   Plan: Return  again in 3 weeks, via webex. I discussed the assessment and treatment plan with the patient. The patient was provided an opportunity to ask questions and all were answered. The patient agreed with the plan and demonstrated an understanding of the instructions.   The patient was advised to call back or seek an in-person evaluation if the symptoms worsen or if the condition fails to improve as anticipated.  Diagnosis: MDD, mild   [name], [facility] 11/20/2018

## 2018-12-11 ENCOUNTER — Ambulatory Visit (INDEPENDENT_AMBULATORY_CARE_PROVIDER_SITE_OTHER): Payer: Commercial Managed Care - PPO | Admitting: Psychology

## 2018-12-11 ENCOUNTER — Other Ambulatory Visit: Payer: Self-pay

## 2018-12-11 DIAGNOSIS — F330 Major depressive disorder, recurrent, mild: Secondary | ICD-10-CM

## 2018-12-11 NOTE — Progress Notes (Signed)
Virtual Visit via Video Note  I connected with Yolanda Wolfe on 12/11/18 at 10:00 AM EDT by a video enabled telemedicine application and verified that I am speaking with the correct person using two identifiers.   I discussed the limitations of evaluation and management by telemedicine and the availability of in person appointments. The patient expressed understanding and agreed to proceed.    I discussed the assessment and treatment plan with the patient. The patient was provided an opportunity to ask questions and all were answered. The patient agreed with the plan and demonstrated an understanding of the instructions.   The patient was advised to call back or seek an in-person evaluation if the symptoms worsen or if the condition fails to improve as anticipated.  I provided 30 minutes of non-face-to-face time during this encounter.   [name] [facility]    THERAPIST PROGRESS NOTE  Session Time: 11am-11.30am  Participation Level: Active  Behavioral Response: Well GroomedAlertaffect wnl  Type of Therapy: Individual Therapy  Treatment Goals addressed: Diagnosis: MDD and goal 1.  Interventions: CBT and Strength-based  Summary: Yolanda Wolfe is a 43 y.o. female who presents with affect bright.  Pt reported that she has been doing well over past several weeks.  Pt reported that she was able to go w/ her fiance to the beach and social distance and felt good about this.  Needed change of space and interaction for them.  Pt reported that also attended his daughter graduation and was done well.  Pt discussed that she has eased into her summer work from home and that campus is shut down.  Pt has been able to be flexible w/ herself and get her work accomplished. Pt reported on possible house open in neighborhood they were looking at and excited to have potential plans and aware that if doesn't work out this time still able to move forward.   Suicidal/Homicidal:  Nowithout intent/plan  Therapist Response: Assessed pt current functioning per pt report. Processed w/pt coping w/ interactions and transition to summer and work w/ social distancing.  Reflected positive coping and reframing.  Plan: Return again in 3 weeks, via webex. Diagnosis: MDD, mild   [name], [facility] 12/11/2018

## 2018-12-26 ENCOUNTER — Ambulatory Visit: Payer: Self-pay | Admitting: *Deleted

## 2018-12-26 ENCOUNTER — Ambulatory Visit (INDEPENDENT_AMBULATORY_CARE_PROVIDER_SITE_OTHER): Payer: Commercial Managed Care - PPO | Admitting: Family Medicine

## 2018-12-26 ENCOUNTER — Other Ambulatory Visit: Payer: Self-pay

## 2018-12-26 DIAGNOSIS — J309 Allergic rhinitis, unspecified: Secondary | ICD-10-CM | POA: Diagnosis not present

## 2018-12-26 DIAGNOSIS — R05 Cough: Secondary | ICD-10-CM | POA: Diagnosis not present

## 2018-12-26 DIAGNOSIS — R059 Cough, unspecified: Secondary | ICD-10-CM

## 2018-12-26 NOTE — Progress Notes (Signed)
Virtual Visit via Video Note   I connected with Yolanda Wolfe on 12/29/18 at  3:15 PM EDT by a video enabled telemedicine application and verified that I am speaking with the correct person using two identifiers.  Location patient: home Location provider:home office Persons participating in the virtual visit: patient, provider  I discussed the limitations of evaluation and management by telemedicine and the availability of in person appointments. The patient expressed understanding and agreed to proceed.  HPI: Yolanda Wolfe is a 43 yo female with Hx of allergies,anxiety,and GERD among some c/o "scratchy" throat,cough,and congestion. She has not identified exacerbating or alleviating factors.  She wonders if symptoms are caused by allergies. A week of non productive cough and 2 weeks of nasal congestion. + Post nasal drainage.  Symptoms stable.  Denies fever,chills,fatigue,body aches,stridor,dysphagia,wheezing,dyspnea, CP,abdominal pain,N/V,or changes in bowel habits.  No sick contact or known contact with COVID 19 infected person. Negative for anosmia or ageusia.  Hx of allergic rhinitis, she is not taking medications at this time.  ROS: See pertinent positives and negatives per HPI.  Past Medical History:  Diagnosis Date  . Anxiety   . Blood transfusion without reported diagnosis   . Depression   . Frequent headaches   . GERD (gastroesophageal reflux disease)   . Heart murmur   . Migraine   . UTI (lower urinary tract infection)     Past Surgical History:  Procedure Laterality Date  . ABDOMINAL HYSTERECTOMY  2009  . CESAREAN SECTION      Family History  Problem Relation Age of Onset  . Cancer Maternal Grandmother        breast  . Heart disease Maternal Grandfather   . Alcohol abuse Paternal Grandfather   . Dementia Paternal Grandmother   . Cancer Mother   . Cancer Father     Social History   Socioeconomic History  . Marital status: Divorced    Spouse name:  Not on file  . Number of children: 1  . Years of education: Not on file  . Highest education level: Not on file  Occupational History  . Not on file  Social Needs  . Financial resource strain: Not on file  . Food insecurity    Worry: Not on file    Inability: Not on file  . Transportation needs    Medical: Not on file    Non-medical: Not on file  Tobacco Use  . Smoking status: Never Smoker  . Smokeless tobacco: Never Used  Substance and Sexual Activity  . Alcohol use: Yes    Alcohol/week: 1.0 standard drinks    Types: 1 Glasses of wine per week  . Drug use: No  . Sexual activity: Yes    Partners: Male    Birth control/protection: Surgical  Lifestyle  . Physical activity    Days per week: Not on file    Minutes per session: Not on file  . Stress: Not on file  Relationships  . Social Herbalist on phone: Not on file    Gets together: Not on file    Attends religious service: Not on file    Active member of club or organization: Not on file    Attends meetings of clubs or organizations: Not on file    Relationship status: Not on file  . Intimate partner violence    Fear of current or ex partner: Not on file    Emotionally abused: Not on file    Physically abused: Not  on file    Forced sexual activity: Not on file  Other Topics Concern  . Not on file  Social History Narrative  . Not on file    Current Outpatient Medications:  .  FLUoxetine (PROZAC) 20 MG capsule, Take 1 capsule (20 mg total) by mouth daily., Disp: 90 capsule, Rfl: 2 .  LORazepam (ATIVAN) 1 MG tablet, 1 and a  Half qhs, Disp: 45 tablet, Rfl: 4 .  tazarotene (AVAGE) 0.1 % cream, APPLY 1 APPLICATION APPLY ON THE SKIN NIGHTLY, Disp: , Rfl: 11  EXAM:  VITALS per patient if applicable:N/A  GENERAL: alert, oriented, appears well and in no acute distress  HEENT: atraumatic, conjunctiva clear, no obvious abnormalities on inspection of external nose and ears. No dysphonia or stridor.  NECK:  normal movements of the head and neck  LUNGS: on inspection no signs of respiratory distress, breathing rate appears normal, no obvious gross SOB, gasping or wheezing  CV: no obvious cyanosis  PSYCH/NEURO: pleasant and cooperative, no obvious depression or anxiety, speech and thought processing grossly intact  ASSESSMENT AND PLAN:  Discussed the following assessment and plan:  Cough - Plan: We discussed possible etiologies, including viral vs allergic etiology. Hx does not suggest a serious process,so I do not think imaging or further testing is needed. Instructed about warning signs.  Allergic rhinitis, unspecified seasonality, unspecified trigger - Plan: Flonase nasal spray daily as needed,OTC Zyrec 10 mg daily,and nasal irrigations with saline water as needed. F/U as needed.   I discussed the assessment and treatment plan with the patient. She was provided an opportunity to ask questions and all were answered. She agreed with the plan and demonstrated an understanding of the instructions.   The patient was advised to call back or seek an in-person evaluation if the symptoms worsen or if the condition fails to improve as anticipated.  Return if symptoms worsen or fail to improve.    Tashera Montalvo Martinique, MD

## 2018-12-26 NOTE — Telephone Encounter (Signed)
Summary: symptoms question   Pt calling to find out what she should do/take. Pt thinks she has allergies and has some drainage in back of throat, a bit of a sore throat, congestion and cough. Pt asking to speak with a nurse to know if this is something she should worry about or treat with OTC medication.      Patient is concerned that her symptoms are related to allergy symptoms- but she also knows that these symptoms can also be related to COVID symptoms. Patient has started Zyrtec yesterday and it seems to be helping symptoms some.  Discussed symptoms and reasons for virtual visit- reach out to office for appointment- lines busy- will send note for review and scheduling- patient is aware that she will be contacted.  Reason for Disposition . [1] COVID-19 infection suspected by caller or triager AND [2] mild symptoms (cough, fever, or others) AND [1] no complications or SOB  Answer Assessment - Initial Assessment Questions 1. ONSET: "When did the cough begin?"      1 week- congestion- sinus drainage a little longer- couple weeks 2. SEVERITY: "How bad is the cough today?"      Tends to be worse in morning- occasional cough during day 3. RESPIRATORY DISTRESS: "Describe your breathing."      Breathing is good- no problem filling lungs 4. FEVER: "Do you have a fever?" If so, ask: "What is your temperature, how was it measured, and when did it start?"     No fever 5. SPUTUM: "Describe the color of your sputum" (clear, white, yellow, green)     Can feel drainage-no sputum 6. HEMOPTYSIS: "Are you coughing up any blood?" If so ask: "How much?" (flecks, streaks, tablespoons, etc.)     No blood 7. CARDIAC HISTORY: "Do you have any history of heart disease?" (e.g., heart attack, congestive heart failure)      no 8. LUNG HISTORY: "Do you have any history of lung disease?"  (e.g., pulmonary embolus, asthma, emphysema)     no 9. PE RISK FACTORS: "Do you have a history of blood clots?" (or: recent  major surgery, recent prolonged travel, bedridden)     no 10. OTHER SYMPTOMS: "Do you have any other symptoms?" (e.g., runny nose, wheezing, chest pain)       Occasional headache- not out of ordinary   11. PREGNANCY: "Is there any chance you are pregnant?" "When was your last menstrual period?"       n/a 12. TRAVEL: "Have you traveled out of the country in the last month?" (e.g., travel history, exposures)       no  Answer Assessment - Initial Assessment Questions 1. COVID-19 DIAGNOSIS: "Who made your Coronavirus (COVID-19) diagnosis?" "Was it confirmed by a positive lab test?" If not diagnosed by a HCP, ask "Are there lots of cases (community spread) where you live?" (See public health department website, if unsure)     Symptoms present 2. ONSET: "When did the COVID-19 symptoms start?"      2 weeks 3. WORST SYMPTOM: "What is your worst symptom?" (e.g., cough, fever, shortness of breath, muscle aches)     Persistent sore throat- irritation 4. COUGH: "Do you have a cough?" If so, ask: "How bad is the cough?"       Yes- not very bad 5. FEVER: "Do you have a fever?" If so, ask: "What is your temperature, how was it measured, and when did it start?"     No fever 6. RESPIRATORY STATUS: "Describe your breathing?" (  e.g., shortness of breath, wheezing, unable to speak)      No breathing problems 7. BETTER-SAME-WORSE: "Are you getting better, staying the same or getting worse compared to yesterday?"  If getting worse, ask, "In what way?"     Same as yesterday 8. HIGH RISK DISEASE: "Do you have any chronic medical problems?" (e.g., asthma, heart or lung disease, weak immune system, etc.)     No  9. PREGNANCY: "Is there any chance you are pregnant?" "When was your last menstrual period?"     n/a 10. OTHER SYMPTOMS: "Do you have any other symptoms?"  (e.g., chills, fatigue, headache, loss of smell or taste, muscle pain, sore throat)       Sore throat, headache, trouble sleeping- not any  different  Protocols used: CORONAVIRUS (COVID-19) DIAGNOSED OR SUSPECTED-A-AH, COUGH - ACUTE PRODUCTIVE-A-AH

## 2018-12-26 NOTE — Telephone Encounter (Signed)
Patient scheduled for virtual visit with Dr. Martinique at 3:15 PM

## 2018-12-29 ENCOUNTER — Encounter: Payer: Self-pay | Admitting: Family Medicine

## 2019-01-01 ENCOUNTER — Other Ambulatory Visit: Payer: Self-pay

## 2019-01-01 ENCOUNTER — Ambulatory Visit (INDEPENDENT_AMBULATORY_CARE_PROVIDER_SITE_OTHER): Payer: Commercial Managed Care - PPO | Admitting: Psychology

## 2019-01-01 DIAGNOSIS — F330 Major depressive disorder, recurrent, mild: Secondary | ICD-10-CM | POA: Diagnosis not present

## 2019-01-01 NOTE — Progress Notes (Signed)
Virtual Visit via Video Note  I connected with Yolanda Wolfe on 01/01/19 at 11:00 AM EDT by a video enabled telemedicine application and verified that I am speaking with the correct person using two identifiers.   I discussed the limitations of evaluation and management by telemedicine and the availability of in person appointments. The patient expressed understanding and agreed to proceed.    I discussed the assessment and treatment plan with the patient. The patient was provided an opportunity to ask questions and all were answered. The patient agreed with the plan and demonstrated an understanding of the instructions.   The patient was advised to call back or seek an in-person evaluation if the symptoms worsen or if the condition fails to improve as anticipated.  I provided 38 minutes of non-face-to-face time during this encounter.   [name] [facility]    THERAPIST PROGRESS NOTE  Session Time: 11am-11.38am  Participation Level: Active  Behavioral Response: Well GroomedAlertaffect wnl  Type of Therapy: Individual Therapy  Treatment Goals addressed: Diagnosis: MDD and goal 1.  Interventions: CBT  Summary: Yolanda Wolfe is a 43 y.o. female who presents with affect wnl.  Pt reported that overall she has been doing well.  Pt reported that she didn't sleep well the past 2 nights- difficulty initiating sleep- not worried about things, but a lot of thoughts in her mind.  They are moving forward w/ trying to buy the house in her parents neighborhood and fiance getting ready to put his house on the market.  Pt also hadn't seen fiance in couple of days as waiting results of his COVID test. Pt discussed good supports and positives she is looking forward to. Pt discussed using her coping skills and good sleep hygiene.   Suicidal/Homicidal: Nowithout intent/plan  Therapist Response: Assessed pt current functioning per pt report.  processed w/pt sleep and sleep hygiene  and coping skills to manage.  Explored w/pt changes upcoming and things looking forward to.  Discussed stressors w/ covid and job.   Plan: Return again in 3 weeks, via webex.    Diagnosis: MDD, mild   [name], [facility] 01/01/2019

## 2019-01-22 ENCOUNTER — Ambulatory Visit (INDEPENDENT_AMBULATORY_CARE_PROVIDER_SITE_OTHER): Payer: Commercial Managed Care - PPO | Admitting: Psychology

## 2019-01-22 ENCOUNTER — Other Ambulatory Visit: Payer: Self-pay

## 2019-01-22 DIAGNOSIS — F324 Major depressive disorder, single episode, in partial remission: Secondary | ICD-10-CM

## 2019-01-22 NOTE — Progress Notes (Signed)
Virtual Visit via Video Note  I connected with Yolanda Wolfe on 01/22/19 at 10:00 AM EDT by a video enabled telemedicine application and verified that I am speaking with the correct person using two identifiers.   I discussed the limitations of evaluation and management by telemedicine and the availability of in person appointments. The patient expressed understanding and agreed to proceed.  History of Present Illness:    Observations/Objective:   Assessment and Plan:   Follow Up Instructions:    I discussed the assessment and treatment plan with the patient. The patient was provided an opportunity to ask questions and all were answered. The patient agreed with the plan and demonstrated an understanding of the instructions.   The patient was advised to call back or seek an in-person evaluation if the symptoms worsen or if the condition fails to improve as anticipated.  I provided 27 minutes of non-face-to-face time during this encounter.   [name] [facility]    THERAPIST PROGRESS NOTE  Session Time: 10am-10.27am  Participation Level: Active  Behavioral Response: Well GroomedAlertaffect wnl  Type of Therapy: Individual Therapy  Treatment Goals addressed: Diagnosis: MDD and goal 1.  Interventions: CBT and Strength-based  Summary: Yolanda Wolfe is a 43 y.o. female who presents with affect bright.  Pt reported she is doing well w/ her mood, her sleep has been a little better w/out the vivid dreams she was experiencing.  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  Pt reported that her daughter will start back w/ remote learning and option of in person after period of time depending on how things are going.  Pt expressed feels good to make some decisions and know that might have to flex.  Pt reported that fiances house is on the market and that is going well.  Pt has a break to see future in laws next week at [place] and  sees as good opportunity to relax.     Suicidal/Homicidal: Nowithout intent/plan  Therapist Response: Assessed pt current functioning per pt report.  Processed w/ pt coping w/ upcoming transitions and awareness of flexible.  Reflected strengths and how able to use as cope through potential stressors.   Plan: Return again in 1 month via webex.  Diagnosis: MDD   [name] [facility] 01/22/2019

## 2019-02-05 ENCOUNTER — Telehealth: Payer: Self-pay | Admitting: Family Medicine

## 2019-02-05 NOTE — Telephone Encounter (Signed)
Physician Health Screening Form to be filled out- placed in dr's folder.  Fax to [phone] upon completion.

## 2019-02-06 DIAGNOSIS — Z0279 Encounter for issue of other medical certificate: Secondary | ICD-10-CM

## 2019-02-06 NOTE — Telephone Encounter (Signed)
Form placed on providers desk for completion

## 2019-02-08 NOTE — Telephone Encounter (Signed)
Form faxed as requested.

## 2019-02-26 ENCOUNTER — Ambulatory Visit (HOSPITAL_COMMUNITY): Payer: Commercial Managed Care - PPO | Admitting: Psychology

## 2019-04-03 ENCOUNTER — Other Ambulatory Visit: Payer: Self-pay

## 2019-04-03 ENCOUNTER — Ambulatory Visit (INDEPENDENT_AMBULATORY_CARE_PROVIDER_SITE_OTHER): Payer: Commercial Managed Care - PPO | Admitting: Psychiatry

## 2019-04-03 DIAGNOSIS — F325 Major depressive disorder, single episode, in full remission: Secondary | ICD-10-CM

## 2019-04-03 MED ORDER — LORAZEPAM 1 MG PO TABS: ORAL_TABLET | ORAL | 4 refills | Status: DC

## 2019-04-03 MED ORDER — FLUOXETINE HCL 20 MG PO CAPS: 20.0000 mg | ORAL_CAPSULE | Freq: Every day | ORAL | 2 refills | Status: DC

## 2019-04-03 NOTE — Progress Notes (Signed)
Patient ID: Yolanda Wolfe, female   DOB: 1975-12-20, 43 y.o.   MRN: [id number] [facility] MD Progress Note  04/03/2019 2:30 PM Yolanda Wolfe  MRN:  [id number] Subjective: Major depression Principal Problem: Major depression recurrent  Today the patient is doing fairly well.  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  She has an 41 year old or is doing well.  The best part of her life is the fact that in a few months in January she is going to get married.  She is looking forward to it.  She is getting get married in a setting where she will have a small amount of people at the [facility].  The patient is sleeping fairly well.  She has been taking Ativan 1.5 mg and has been very helpful.  Lately her sleep is just a little bit off and she is also noted significant headaches which sound like tension headaches.  She acknowledges significant tension with the upcoming wedding and the fact that there are other worldly stresses like the viral infection and the presidential election.  The patient is eating well.  Her energy is good.  She has no problems thinking and concentrating.  She drinks no alcohol uses no drugs.  She is never had any psychotic symptomatology.  Patient actually is functioning very well.  Patient Active Problem List   Diagnosis Date Noted  . Nephrolithiasis [N20.0] 10/31/2017  . Allergic rhinitis [J30.9] 08/16/2017  . Hyperlipidemia, mixed [E78.2] 08/16/2017  . Class 1 obesity with body mass index (BMI) of 32.0 to 32.9 in adult [E66.9, Z68.32] 08/16/2017  . Major depressive disorder, recurrent episode ([place]) [F33.9] 05/07/2015  . Weight gain [R63.5] 06/19/2014  . Depression [F32.9] 06/19/2014  . Routine general medical examination at a health care facility [Z00.00] 06/19/2014  . Insomnia [G47.00] 06/19/2014   Total Time spent with patient: 15 minutes  Past Psychiatric History:   Past Medical History:  Past Medical History:  Diagnosis  Date  . Anxiety   . Blood transfusion without reported diagnosis   . Depression   . Frequent headaches   . GERD (gastroesophageal reflux disease)   . Heart murmur   . Migraine   . UTI (lower urinary tract infection)     Past Surgical History:  Procedure Laterality Date  . ABDOMINAL HYSTERECTOMY  2009  . CESAREAN SECTION     Family History:  Family History  Problem Relation Age of Onset  . Cancer Maternal Grandmother        breast  . Heart disease Maternal Grandfather   . Alcohol abuse Paternal Grandfather   . Dementia Paternal Grandmother   . Cancer Mother   . Cancer Father    Family Psychiatric  History:  Social History:  Social History   Substance and Sexual Activity  Alcohol Use Yes  . Alcohol/week: 1.0 standard drinks  . Types: 1 Glasses of wine per week     Social History   Substance and Sexual Activity  Drug Use No    Social History   Socioeconomic History  . Marital status: Divorced    Spouse name: Not on file  . Number of children: 1  . Years of education: Not on file  . Highest education level: Not on file  Occupational History  . Not on file  Social Needs  . Financial resource strain: Not on file  . Food insecurity    Worry: Not on file    Inability: Not on  file  . Transportation needs    Medical: Not on file    Non-medical: Not on file  Tobacco Use  . Smoking status: Never Smoker  . Smokeless tobacco: Never Used  Substance and Sexual Activity  . Alcohol use: Yes    Alcohol/week: 1.0 standard drinks    Types: 1 Glasses of wine per week  . Drug use: No  . Sexual activity: Yes    Partners: Male    Birth control/protection: Surgical  Lifestyle  . Physical activity    Days per week: Not on file    Minutes per session: Not on file  . Stress: Not on file  Relationships  . Social Herbalist on phone: Not on file    Gets together: Not on file    Attends religious service: Not on file    Active member of club or organization:  Not on file    Attends meetings of clubs or organizations: Not on file    Relationship status: Not on file  Other Topics Concern  . Not on file  Social History Narrative  . Not on file   Additional Social History:                         Sleep: Poor  Appetite:  Good  Current Medications: Current Outpatient Medications  Medication Sig Dispense Refill  . FLUoxetine (PROZAC) 20 MG capsule Take 1 capsule (20 mg total) by mouth daily. 90 capsule 2  . LORazepam (ATIVAN) 1 MG tablet 2  qhs 60 tablet 4  . tazarotene (AVAGE) 0.1 % cream APPLY 1 APPLICATION APPLY ON THE SKIN NIGHTLY  11   No current facility-administered medications for this visit.     Lab Results: No results found for this or any previous visit (from the past 48 hour(s)).  Blood Alcohol level:  No results found for: [facility]  Physical Findings: AIMS:  , ,  ,  ,    CIWA:    COWS:     Musculoskeletal: Strength & Muscle Tone: within normal limits Gait & Station Patient leans: N/A  Psychiatric Specialty Exam: ROS  There were no vitals taken for this visit.There is no height or weight on file to calculate BMI.  General Appearance: Fairly Groomed  Engineer, water::  Good  Speech:  Clear and Coherent  Volume:  Normal  Mood:  Euthymic  Affect:  Appropriate  Thought Process:  Coherent  Orientation:  Full (Time, Place, and Person)  Thought Content:  WDL  Suicidal Thoughts:  No  Homicidal Thoughts:  No  Memory:  NA  Judgement:  Good  Insight:  Fair  Psychomotor Activity:  Normal  Concentration:  Good  Recall:  Good  Fund of Knowledge:Good  Language: Good  Akathisia:  No  Handed:  Right  AIMS (if indicated):     Assets:  Financial Resources/Insurance  ADL's:  Intact  Cognition: WNL  Sleep:      Treatment Plan Summary: At this time the patient will continue taking Zoloft 100 mg. Is not affecting her sex drive and I do not think is affecting her weight. She claims that seems to feel that is working  well. Now she sleeping fairly well taking Restoril 15 mg 2 at night. The patient will stay in therapy. The patient was given her for all to see Dr. Emi Belfast at [employer]. This patient return to see me in 5 months.  Jerral Ralph, MD 04/03/2019,  2:30 PM Patient ID: Kaaren Mead, female   DOB: 17-May-1976, 43 y.o.   MRN: [id number] [facility] MD Progress Note  04/03/2019 2:30 PM Yolanda Wolfe  MRN:  [id number] Subjective:  Spirits are good Principal Problem: Major depression, recurrent episode Diagnosis:  Major depression, recurrent episode + Total Time spent with patient: 15 minutes  Past Psychiatric History:   Past Medical History:  Past Medical History:  Diagnosis Date  . Anxiety   . Blood transfusion without reported diagnosis   . Depression   . Frequent headaches   . GERD (gastroesophageal reflux disease)   . Heart murmur   . Migraine   . UTI (lower urinary tract infection)     Past Surgical History:  Procedure Laterality Date  . ABDOMINAL HYSTERECTOMY  2009  . CESAREAN SECTION     Family History:  Family History  Problem Relation Age of Onset  . Cancer Maternal Grandmother        breast  . Heart disease Maternal Grandfather   . Alcohol abuse Paternal Grandfather   . Dementia Paternal Grandmother   . Cancer Mother   . Cancer Father    Family Psychiatric  History:  Social History:  Social History   Substance and Sexual Activity  Alcohol Use Yes  . Alcohol/week: 1.0 standard drinks  . Types: 1 Glasses of wine per week     Social History   Substance and Sexual Activity  Drug Use No    Social History   Socioeconomic History  . Marital status: Divorced    Spouse name: Not on file  . Number of children: 1  . Years of education: Not on file  . Highest education level: Not on file  Occupational History  . Not on file  Social Needs  . Financial resource strain: Not on file  . Food insecurity    Worry: Not on file     Inability: Not on file  . Transportation needs    Medical: Not on file    Non-medical: Not on file  Tobacco Use  . Smoking status: Never Smoker  . Smokeless tobacco: Never Used  Substance and Sexual Activity  . Alcohol use: Yes    Alcohol/week: 1.0 standard drinks    Types: 1 Glasses of wine per week  . Drug use: No  . Sexual activity: Yes    Partners: Male    Birth control/protection: Surgical  Lifestyle  . Physical activity    Days per week: Not on file    Minutes per session: Not on file  . Stress: Not on file  Relationships  . Social Herbalist on phone: Not on file    Gets together: Not on file    Attends religious service: Not on file    Active member of club or organization: Not on file    Attends meetings of clubs or organizations: Not on file    Relationship status: Not on file  Other Topics Concern  . Not on file  Social History Narrative  . Not on file   Additional Social History:                         Sleep: Poor  Appetite:  Good  Current Medications: Current Outpatient Medications  Medication Sig Dispense Refill  . FLUoxetine (PROZAC) 20 MG capsule Take 1 capsule (20 mg total) by mouth daily. 90 capsule 2  . LORazepam (ATIVAN) 1 MG tablet 2  qhs  60 tablet 4  . tazarotene (AVAGE) 0.1 % cream APPLY 1 APPLICATION APPLY ON THE SKIN NIGHTLY  11   No current facility-administered medications for this visit.     Lab Results: No results found for this or any previous visit (from the past 48 hour(s)).  Blood Alcohol level:  No results found for: [facility]  Physical Findings: AIMS:  , ,  ,  ,    CIWA:    COWS:     Musculoskeletal: Strength & Muscle Tone: within normal limits Gait & Station Patient leans: N/A  Psychiatric Specialty Exam: ROS  There were no vitals taken for this visit.There is no height or weight on file to calculate BMI.  General Appearance: Fairly Groomed  Engineer, water::  Good  Speech:  Clear and Coherent   Volume:  Normal  Mood:  Euthymic  Affect:  Appropriate  Thought Process:  Coherent  Orientation:  Full (Time, Place, and Person)  Thought Content:  WDL  Suicidal Thoughts:  No  Homicidal Thoughts:  No  Memory:  NA  Judgement:  Good  Insight:  Fair  Psychomotor Activity:  Normal  Concentration:  Good  Recall:  Good  Fund of Knowledge:Good  Language: Good  Akathisia:  No  Handed:  Right  AIMS (if indicated):     Assets:  Financial Resources/Insurance  ADL's:  Intact  Cognition: WNL  Sleep:      Treatment Plan Summary  This patient has 2 problems.  Her first is major depression which is in remission.  She will continue taking Prozac indefinitely.  This serves her well.  Efforts to reduce it or change it has led to decline.  For insomnia her second problem she takes Ativan and today we will increase it from 1.5 mg to a dose of 2 mg every night.  The patient shares with me that if her tension headaches do not go away within a few weeks she will talk to her primary care doctor about some treatment.  The patient is functioning extremely well.  She will return to see me in 5 months.

## 2019-05-06 ENCOUNTER — Other Ambulatory Visit: Payer: Self-pay

## 2019-05-06 DIAGNOSIS — Z20822 Contact with and (suspected) exposure to covid-19: Secondary | ICD-10-CM

## 2019-05-09 LAB — NOVEL CORONAVIRUS, NAA: SARS-CoV-2, NAA: NOT DETECTED

## 2019-08-12 ENCOUNTER — Ambulatory Visit: Payer: Commercial Managed Care - PPO | Attending: Family

## 2019-08-12 DIAGNOSIS — Z23 Encounter for immunization: Secondary | ICD-10-CM

## 2019-08-12 NOTE — Progress Notes (Signed)
   Covid-19 Vaccination Clinic  Name:  Yolanda Wolfe    MRN: [id number] DOB: [birth year]  08/12/2019  Yolanda Wolfe was observed post Covid-19 immunization for 15 minutes without incidence. She was provided with Vaccine Information Sheet and instruction to access the V-Safe system.   Yolanda Wolfe was instructed to call 911 with any severe reactions post vaccine: Marland Kitchen Difficulty breathing  . Swelling of your face and throat  . A fast heartbeat  . A bad rash all over your body  . Dizziness and weakness    Immunizations Administered    Name Date Dose VIS Date Route   Moderna COVID-19 Vaccine 08/12/2019  3:01 PM 0.5 mL 05/21/2019 Intramuscular   Manufacturer: Moderna   Lot: [id number]   [id number]

## 2019-09-04 ENCOUNTER — Ambulatory Visit (INDEPENDENT_AMBULATORY_CARE_PROVIDER_SITE_OTHER): Payer: Commercial Managed Care - PPO | Admitting: Psychiatry

## 2019-09-04 ENCOUNTER — Other Ambulatory Visit: Payer: Self-pay

## 2019-09-04 DIAGNOSIS — F324 Major depressive disorder, single episode, in partial remission: Secondary | ICD-10-CM | POA: Diagnosis not present

## 2019-09-04 MED ORDER — FLUOXETINE HCL 20 MG PO CAPS: 20.0000 mg | ORAL_CAPSULE | Freq: Every day | ORAL | 2 refills | Status: DC

## 2019-09-04 MED ORDER — LORAZEPAM 1 MG PO TABS: ORAL_TABLET | ORAL | 4 refills | Status: DC

## 2019-09-04 NOTE — Progress Notes (Signed)
Patient ID: Yolanda Wolfe, female   DOB: 02-06-1976, 44 y.o.   MRN: [id number] [facility] MD Progress Note  09/04/2019 4:32 PM Yolanda Wolfe  MRN:  [id number] Subjective: Major depression Principal Problem: Major depression recurrent  Today the patient is doing fairly well.  She got married.  She feels really good about.  She does not feel so good about herself.  She is gaining some weight because she is eating the wrong food.  She says she is not eating excessive food but just the wrong food.  Her sleep is somewhat erratic.  Her energy level is fair.  She has good days and bad days.  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  The good news is she is reading.  Otherwise her life is fairly good.  She is not exercising.  The patient has been in therapy and feels like she needs a more aggressive active therapist.  The patient is ready to acknowledge that she in fact needs to make changes herself.  The Prozac is stable and the Ativan increase did help to small degree.  The patient is not excessively anxious.  She drinks no alcohol uses no drugs.  She has no physical complaints.  She is functioning actually very well. Patient Active Problem List   Diagnosis Date Noted  . Nephrolithiasis [N20.0] 10/31/2017  . Allergic rhinitis [J30.9] 08/16/2017  . Hyperlipidemia, mixed [E78.2] 08/16/2017  . Class 1 obesity with body mass index (BMI) of 32.0 to 32.9 in adult [E66.9, Z68.32] 08/16/2017  . Major depressive disorder, recurrent episode ([place]) [F33.9] 05/07/2015  . Weight gain [R63.5] 06/19/2014  . Depression [F32.9] 06/19/2014  . Routine general medical examination at a health care facility [Z00.00] 06/19/2014  . Insomnia [G47.00] 06/19/2014   Total Time spent with patient: 15 minutes  Past Psychiatric History:   Past Medical History:  Past Medical History:  Diagnosis Date  . Anxiety   . Blood transfusion without reported diagnosis   . Depression   . Frequent headaches    . GERD (gastroesophageal reflux disease)   . Heart murmur   . Migraine   . UTI (lower urinary tract infection)     Past Surgical History:  Procedure Laterality Date  . ABDOMINAL HYSTERECTOMY  2009  . CESAREAN SECTION     Family History:  Family History  Problem Relation Age of Onset  . Cancer Maternal Grandmother        breast  . Heart disease Maternal Grandfather   . Alcohol abuse Paternal Grandfather   . Dementia Paternal Grandmother   . Cancer Mother   . Cancer Father    Family Psychiatric  History:  Social History:  Social History   Substance and Sexual Activity  Alcohol Use Yes  . Alcohol/week: 1.0 standard drinks  . Types: 1 Glasses of wine per week     Social History   Substance and Sexual Activity  Drug Use No    Social History   Socioeconomic History  . Marital status: Married    Spouse name: Not on file  . Number of children: 1  . Years of education: Not on file  . Highest education level: Not on file  Occupational History  . Not on file  Tobacco Use  . Smoking status: Never Smoker  . Smokeless tobacco: Never Used  Substance and Sexual Activity  . Alcohol use: Yes    Alcohol/week: 1.0 standard drinks    Types: 1 Glasses of wine per week  .  Drug use: No  . Sexual activity: Yes    Partners: Male    Birth control/protection: Surgical  Other Topics Concern  . Not on file  Social History Narrative  . Not on file   Social Determinants of Health   Financial Resource Strain:   . Difficulty of Paying Living Expenses:   Food Insecurity:   . Worried About Charity fundraiser in the Last Year:   . Arboriculturist in the Last Year:   Transportation Needs:   . Film/video editor (Medical):   Marland Kitchen Lack of Transportation (Non-Medical):   Physical Activity:   . Days of Exercise per Week:   . Minutes of Exercise per Session:   Stress:   . Feeling of Stress :   Social Connections:   . Frequency of Communication with Friends and Family:   .  Frequency of Social Gatherings with Friends and Family:   . Attends Religious Services:   . Active Member of Clubs or Organizations:   . Attends Archivist Meetings:   Marland Kitchen Marital Status:    Additional Social History:                         Sleep: Poor  Appetite:  Good  Current Medications: Current Outpatient Medications  Medication Sig Dispense Refill  . FLUoxetine (PROZAC) 20 MG capsule Take 1 capsule (20 mg total) by mouth daily. 90 capsule 2  . LORazepam (ATIVAN) 1 MG tablet 2  qhs 60 tablet 4  . tazarotene (AVAGE) 0.1 % cream APPLY 1 APPLICATION APPLY ON THE SKIN NIGHTLY  11   No current facility-administered medications for this visit.    Lab Results: No results found for this or any previous visit (from the past 48 hour(s)).  Blood Alcohol level:  No results found for: [facility]  Physical Findings: AIMS:  , ,  ,  ,    CIWA:    COWS:     Musculoskeletal: Strength & Muscle Tone: within normal limits Gait & Station Patient leans: N/A  Psychiatric Specialty Exam: ROS  There were no vitals taken for this visit.There is no height or weight on file to calculate BMI.  General Appearance: Fairly Groomed  Engineer, water::  Good  Speech:  Clear and Coherent  Volume:  Normal  Mood:  Euthymic  Affect:  Appropriate  Thought Process:  Coherent  Orientation:  Full (Time, Place, and Person)  Thought Content:  WDL  Suicidal Thoughts:  No  Homicidal Thoughts:  No  Memory:  NA  Judgement:  Good  Insight:  Fair  Psychomotor Activity:  Normal  Concentration:  Good  Recall:  Good  Fund of Knowledge:Good  Language: Good  Akathisia:  No  Handed:  Right  AIMS (if indicated):     Assets:  Financial Resources/Insurance  ADL's:  Intact  Cognition: WNL  Sleep:      Treatment Plan Summary: At this time the patient will continue taking Zoloft 100 mg. Is not affecting her sex drive and I do not think is affecting her weight. She claims that seems to feel that is  working well. Now she sleeping fairly well taking Restoril 15 mg 2 at night. The patient will stay in therapy. The patient was given her for all to see Dr. Emi Belfast at [facility]. This patient return to see me in 5 months.  Jerral Ralph, MD 09/04/2019, 4:32 PM Patient ID: Yolanda Wolfe, female  DOB: January 03, 1976, 44 y.o.   MRN: [id number] [facility] MD Progress Note  09/04/2019 4:32 PM Yolanda Wolfe  MRN:  [id number] Subjective:  Spirits are good Principal Problem: Major depression, recurrent episode Diagnosis:  Major depression, recurrent episode + Total Time spent with patient: 15 minutes  Past Psychiatric History:   Past Medical History:  Past Medical History:  Diagnosis Date  . Anxiety   . Blood transfusion without reported diagnosis   . Depression   . Frequent headaches   . GERD (gastroesophageal reflux disease)   . Heart murmur   . Migraine   . UTI (lower urinary tract infection)     Past Surgical History:  Procedure Laterality Date  . ABDOMINAL HYSTERECTOMY  2009  . CESAREAN SECTION     Family History:  Family History  Problem Relation Age of Onset  . Cancer Maternal Grandmother        breast  . Heart disease Maternal Grandfather   . Alcohol abuse Paternal Grandfather   . Dementia Paternal Grandmother   . Cancer Mother   . Cancer Father    Family Psychiatric  History:  Social History:  Social History   Substance and Sexual Activity  Alcohol Use Yes  . Alcohol/week: 1.0 standard drinks  . Types: 1 Glasses of wine per week     Social History   Substance and Sexual Activity  Drug Use No    Social History   Socioeconomic History  . Marital status: Married    Spouse name: Not on file  . Number of children: 1  . Years of education: Not on file  . Highest education level: Not on file  Occupational History  . Not on file  Tobacco Use  . Smoking status: Never Smoker  . Smokeless tobacco: Never Used  Substance and Sexual  Activity  . Alcohol use: Yes    Alcohol/week: 1.0 standard drinks    Types: 1 Glasses of wine per week  . Drug use: No  . Sexual activity: Yes    Partners: Male    Birth control/protection: Surgical  Other Topics Concern  . Not on file  Social History Narrative  . Not on file   Social Determinants of Health   Financial Resource Strain:   . Difficulty of Paying Living Expenses:   Food Insecurity:   . Worried About Charity fundraiser in the Last Year:   . Arboriculturist in the Last Year:   Transportation Needs:   . Film/video editor (Medical):   Marland Kitchen Lack of Transportation (Non-Medical):   Physical Activity:   . Days of Exercise per Week:   . Minutes of Exercise per Session:   Stress:   . Feeling of Stress :   Social Connections:   . Frequency of Communication with Friends and Family:   . Frequency of Social Gatherings with Friends and Family:   . Attends Religious Services:   . Active Member of Clubs or Organizations:   . Attends Archivist Meetings:   Marland Kitchen Marital Status:    Additional Social History:                         Sleep: Poor  Appetite:  Good  Current Medications: Current Outpatient Medications  Medication Sig Dispense Refill  . FLUoxetine (PROZAC) 20 MG capsule Take 1 capsule (20 mg total) by mouth daily. 90 capsule 2  . LORazepam (ATIVAN) 1 MG tablet 2  qhs 60 tablet  4  . tazarotene (AVAGE) 0.1 % cream APPLY 1 APPLICATION APPLY ON THE SKIN NIGHTLY  11   No current facility-administered medications for this visit.    Lab Results: No results found for this or any previous visit (from the past 48 hour(s)).  Blood Alcohol level:  No results found for: [facility]  Physical Findings: AIMS:  , ,  ,  ,    CIWA:    COWS:     Musculoskeletal: Strength & Muscle Tone: within normal limits Gait & Station Patient leans: N/A  Psychiatric Specialty Exam: ROS  There were no vitals taken for this visit.There is no height or weight on file  to calculate BMI.  General Appearance: Fairly Groomed  Engineer, water::  Good  Speech:  Clear and Coherent  Volume:  Normal  Mood:  Euthymic  Affect:  Appropriate  Thought Process:  Coherent  Orientation:  Full (Time, Place, and Person)  Thought Content:  WDL  Suicidal Thoughts:  No  Homicidal Thoughts:  No  Memory:  NA  Judgement:  Good  Insight:  Fair  Psychomotor Activity:  Normal  Concentration:  Good  Recall:  Good  Fund of Knowledge:Good  Language: Good  Akathisia:  No  Handed:  Right  AIMS (if indicated):     Assets:  Financial Resources/Insurance  ADL's:  Intact  Cognition: WNL  Sleep:      Treatment Plan Summary  I believe the patient is doing fairly well.  She wants to be challenged more.  I think she has to take more responsibility for treatment.  Ativan is at the highest dose that I will prescribe that is 2 mg at night.  She takes Prozac on a regular basis.  Her diagnosis is that of major depression and insomnia.  Her plans today is to give her the name of Dr. Pervis Hocking.  Patient will call her.  We will call her back and give her another appointment to see me in 3 months.  Patient is not suicidal.  Again she is functioning fairly well.

## 2019-09-05 ENCOUNTER — Ambulatory Visit (HOSPITAL_COMMUNITY): Payer: Commercial Managed Care - PPO | Admitting: Psychiatry

## 2019-09-17 ENCOUNTER — Ambulatory Visit: Payer: Commercial Managed Care - PPO | Attending: Family

## 2019-09-17 DIAGNOSIS — Z23 Encounter for immunization: Secondary | ICD-10-CM

## 2019-09-17 NOTE — Progress Notes (Signed)
   Covid-19 Vaccination Clinic  Name:  Yolanda Wolfe    MRN: [id number] DOB: [birth year]  09/17/2019  [name] was observed post Covid-19 immunization for 15 minutes without incident. She was provided with Vaccine Information Sheet and instruction to access the V-Safe system.   [name] was instructed to call 911 with any severe reactions post vaccine: Marland Kitchen Difficulty breathing  . Swelling of face and throat  . A fast heartbeat  . A bad rash all over body  . Dizziness and weakness   Immunizations Administered    Name Date Dose VIS Date Route   Moderna COVID-19 Vaccine 09/17/2019  4:08 PM 0.5 mL 05/21/2019 Intramuscular   Manufacturer: Moderna   Lot: [id number]   [id number]

## 2019-10-24 ENCOUNTER — Encounter (HOSPITAL_COMMUNITY): Payer: Self-pay | Admitting: Psychology

## 2019-10-30 ENCOUNTER — Telehealth: Payer: Self-pay | Admitting: Family Medicine

## 2019-10-30 NOTE — Telephone Encounter (Signed)
Pt called in for an in office appt for severe headache and ear pain. Teams [place] and she Ok'd the in person visit. LVM for pt to return my call to schedule.

## 2019-10-30 NOTE — Progress Notes (Signed)
Yolanda Wolfe is a 44 y.o. female patient who is discharged from counseling as last seen for tx over 90 days ago.        [name], [facility]

## 2019-11-01 ENCOUNTER — Other Ambulatory Visit: Payer: Self-pay

## 2019-11-04 ENCOUNTER — Ambulatory Visit (INDEPENDENT_AMBULATORY_CARE_PROVIDER_SITE_OTHER): Payer: Commercial Managed Care - PPO | Admitting: Family Medicine

## 2019-11-04 ENCOUNTER — Encounter: Payer: Self-pay | Admitting: Family Medicine

## 2019-11-04 ENCOUNTER — Other Ambulatory Visit: Payer: Self-pay

## 2019-11-04 VITALS — BP 90/82 | HR 56 | Temp 97.7°F | Resp 12 | Ht 64.0 in | Wt 179.0 lb

## 2019-11-04 DIAGNOSIS — H9192 Unspecified hearing loss, left ear: Secondary | ICD-10-CM

## 2019-11-04 DIAGNOSIS — Z6830 Body mass index (BMI) 30.0-30.9, adult: Secondary | ICD-10-CM | POA: Diagnosis not present

## 2019-11-04 DIAGNOSIS — R519 Headache, unspecified: Secondary | ICD-10-CM

## 2019-11-04 DIAGNOSIS — E669 Obesity, unspecified: Secondary | ICD-10-CM

## 2019-11-04 MED ORDER — AMITRIPTYLINE HCL 25 MG PO TABS: 25.0000 mg | ORAL_TABLET | Freq: Every day | ORAL | 1 refills | Status: DC

## 2019-11-04 NOTE — Patient Instructions (Addendum)
Health Maintenance Due  Topic Date Due  . HIV Screening  Never done  . PAP SMEAR-Modifier  09/19/2015    A few things to remember from today's visit: Migraine versus tension headache. Amitriptyline 25 mg to take at bedtime, you can start with 1/2 tablet and increase it to the whole tablet in 2 weeks if well-tolerated. He can cause drowsiness. Continue headache diary.  Mild hearing loss she reported could be related with mucus behind eardrum. Popping your ears a few times during the day may help.  If you need refills please call your pharmacy. Do not use My Chart to request refills or for acute issues that need immediate attention.    Please be sure medication list is accurate. If a new problem present, please set up appointment sooner than planned today.

## 2019-11-04 NOTE — Progress Notes (Signed)
ACUTE VISIT  Chief Complaint  Patient presents with  . Headache  . Ear Pain   HPI: [name] is a 44 y.o. female, who is here today with above concerns. Years hx of headache, since childhood, that seems to be getting worse as she gets older. It is exacerbated by seasonal changes, stress, or lack of sleep. She has not identified alleviating factors. She may wake up with headache or it starts in the middle of the day or at night.  She takes Excedrin Migraine's, which most of the time does not help.  She usually does not sleep well but better for the past few months. Headache is associated with nausea most of the time, photophobia, and phonophobia. Sharp pain that "moves around", bitemporal, frontal, and for the past 2 years occipital. She has about 2-4 episodes per week, max level of pain 8-9/10.  No associated fever, chills, night sweats, or focal neurologic deficit.  -A couple months ago she started with "minor" left ear tinnitus and hearing loss. She had a few days of earache, resolved. Negative for recent URI or travel. No history of trauma. She has not noted ear drainage or sore throat. She wonders if problem is caused by cerumen in ear canal. She has not tried OTC medications.  -She is reporting 10 Lb wt loss. She is eating healthier, decreased carbs intake. Exercising some but not consistently.  Review of Systems  Constitutional: Positive for fatigue. Negative for chills.  HENT: Negative for dental problem, mouth sores, nosebleeds and sinus pain.   Eyes: Negative for redness and visual disturbance.  Respiratory: Negative for shortness of breath and wheezing.   Cardiovascular: Negative for chest pain, palpitations and leg swelling.  Gastrointestinal: Negative for abdominal pain and vomiting.       No changes in bowel habits.  Musculoskeletal: Negative for gait problem and myalgias.  Skin: Negative for rash.  Allergic/Immunologic:  Positive for environmental allergies.  Neurological: Negative for syncope, facial asymmetry and numbness.  Psychiatric/Behavioral: Positive for sleep disturbance. Negative for confusion.  Rest see pertinent positives and negatives per HPI.  Current Outpatient Medications on File Prior to Visit  Medication Sig Dispense Refill  . FLUoxetine (PROZAC) 20 MG capsule Take 1 capsule (20 mg total) by mouth daily. 90 capsule 2  . LORazepam (ATIVAN) 1 MG tablet 2  qhs 60 tablet 4  . tazarotene (AVAGE) 0.1 % cream APPLY 1 APPLICATION APPLY ON THE SKIN NIGHTLY  11   No current facility-administered medications on file prior to visit.   Past Medical History:  Diagnosis Date  . Anxiety   . Blood transfusion without reported diagnosis   . Depression   . Frequent headaches   . GERD (gastroesophageal reflux disease)   . Heart murmur   . Migraine   . UTI (lower urinary tract infection)    No Known Allergies  Social History   Socioeconomic History  . Marital status: Married    Spouse name: Not on file  . Number of children: 1  . Years of education: Not on file  . Highest education level: Not on file  Occupational History  . Not on file  Tobacco Use  . Smoking status: Never Smoker  . Smokeless tobacco: Never Used  Substance and Sexual Activity  . Alcohol use: Yes    Alcohol/week: 1.0 standard drinks    Types: 1 Glasses of wine per week  . Drug use: No  . Sexual activity: Yes  Partners: Male    Birth control/protection: Surgical  Other Topics Concern  . Not on file  Social History Narrative  . Not on file   Social Determinants of Health   Financial Resource Strain:   . Difficulty of Paying Living Expenses:   Food Insecurity:   . Worried About Charity fundraiser in the Last Year:   . Arboriculturist in the Last Year:   Transportation Needs:   . Film/video editor (Medical):   Marland Kitchen Lack of Transportation (Non-Medical):   Physical Activity:   . Days of Exercise per Week:     . Minutes of Exercise per Session:   Stress:   . Feeling of Stress :   Social Connections:   . Frequency of Communication with Friends and Family:   . Frequency of Social Gatherings with Friends and Family:   . Attends Religious Services:   . Active Member of Clubs or Organizations:   . Attends Archivist Meetings:   Marland Kitchen Marital Status:     Vitals:   11/04/19 1220  BP: 90/82  Pulse: (!) 56  Resp: 12  Temp: 97.7 F (36.5 C)  SpO2: 95%   Body mass index is 30.73 kg/m.  Physical Exam  Nursing note and vitals reviewed. Constitutional: She is oriented to person, place, and time. She appears well-developed. No distress.  HENT:  Head: Normocephalic and atraumatic.  Right Ear: Tympanic membrane, external ear and ear canal normal.  Left Ear: External ear and ear canal normal. A middle ear effusion (minimal) is present.  Mouth/Throat: Oropharynx is clear and moist and mucous membranes are normal.  Eyes: Pupils are equal, round, and reactive to light. Conjunctivae are normal.  Cardiovascular: Regular rhythm. Bradycardia present.  No murmur heard. Respiratory: Effort normal and breath sounds normal. No respiratory distress.  Musculoskeletal:        General: No edema.     Cervical back: Spasms present. No tenderness or bony tenderness. Normal range of motion.  Lymphadenopathy:    She has no cervical adenopathy.  Neurological: She is alert and oriented to person, place, and time. She has normal strength. No cranial nerve deficit. Gait normal.  Reflex Scores:      Bicep reflexes are 2+ on the right side and 2+ on the left side.      Patellar reflexes are 2+ on the right side and 2+ on the left side. Skin: Skin is warm. No rash noted. No erythema.  Psychiatric: She has a normal mood and affect.  Well groomed, good eye contact.   ASSESSMENT AND PLAN:  [name] was seen today for headache and ear pain.  Diagnoses and all orders for this visit:  Hearing loss of left  ear, unspecified hearing loss type  Hearing decreased at 750 Hz frequency,left ear,rest otherwise normal. We discussed possible etiologies,? eustachian tube dysfunction. Outo inflation maneuvers a few times throughout the day recommended. Clearly instructed about warning signs. If problem persist for 4 to 6 weeks or gets worse, will recommend ENT evaluation.  Headache, unspecified headache type Migraine vs tension headache. We discussed options in regard to prophylactic treatment, she agrees with trying amitriptyline 25 mg, instructed to start 1/2 tablet at bedtime for a couple weeks and increase to the whole tablet if well-tolerated. We discussed some side effects of medication. I do not think imaging is needed at this time. Continue headache diary. Follow-up in 4 to 5 weeks, before if needed.  -     amitriptyline (  ELAVIL) 25 MG tablet; Take 1 tablet (25 mg total) by mouth at bedtime.  Class 1 obesity with serious comorbidity and body mass index (BMI) of 30.0 to 30.9 in adult, unspecified obesity type Encouraged to continue following a healthful diet and engage in regular physical activity.    Hearing Screening   125Hz  250Hz  500Hz  1000Hz  2000Hz  3000Hz  4000Hz  6000Hz  8000Hz   Right ear: Pass Pass Pass Pass Pass Pass Pass Pass Pass  Left ear: Pass Pass Pass Pass Pass Pass Pass Pass Pass     Return in about 4 weeks (around 12/02/2019) for headache.   Denim Kalmbach G. [place], MD  [facility]. [place] office.  Discharge Instructions   None    A few things to remember from today's visit: Migraine versus tension headache. Amitriptyline 25 mg to take at bedtime, you can start with 1/2 tablet and increase it to the whole tablet in 2 weeks if well-tolerated. He can cause drowsiness. Continue headache diary.  Mild hearing loss she reported could be related with mucus behind eardrum. Popping your ears a few times during the day may help.  If you need refills please call your  pharmacy. Do not use My Chart to request refills or for acute issues that need immediate attention.    Please be sure medication list is accurate. If a new problem present, please set up appointment sooner than planned today.

## 2019-11-29 ENCOUNTER — Telehealth: Payer: Self-pay | Admitting: Family Medicine

## 2019-11-29 ENCOUNTER — Other Ambulatory Visit: Payer: Self-pay | Admitting: *Deleted

## 2019-11-29 DIAGNOSIS — R519 Headache, unspecified: Secondary | ICD-10-CM

## 2019-11-29 MED ORDER — AMITRIPTYLINE HCL 25 MG PO TABS: 25.0000 mg | ORAL_TABLET | Freq: Every day | ORAL | 1 refills | Status: DC

## 2019-11-29 NOTE — Telephone Encounter (Signed)
Rx sent to pharmacy as requested.

## 2019-11-29 NOTE — Telephone Encounter (Signed)
Pt call and need a refill  amitriptyline (ELAVIL) 25 MG tablet sent to  [facility] [place], [place] Phone:  [phone]  Fax:  [phone]

## 2019-12-04 ENCOUNTER — Telehealth (INDEPENDENT_AMBULATORY_CARE_PROVIDER_SITE_OTHER): Payer: Commercial Managed Care - PPO | Admitting: Psychiatry

## 2019-12-04 ENCOUNTER — Other Ambulatory Visit: Payer: Self-pay

## 2019-12-04 DIAGNOSIS — F3342 Major depressive disorder, recurrent, in full remission: Secondary | ICD-10-CM

## 2019-12-04 MED ORDER — LORAZEPAM 1 MG PO TABS: ORAL_TABLET | ORAL | 5 refills | Status: DC

## 2019-12-04 MED ORDER — FLUOXETINE HCL 20 MG PO CAPS: 20.0000 mg | ORAL_CAPSULE | Freq: Every day | ORAL | 2 refills | Status: DC

## 2019-12-04 NOTE — Progress Notes (Signed)
Patient ID: Yolanda Wolfe, female   DOB: 1975-12-03, 44 y.o.   MRN: [id number] [facility] MD Progress Note  12/04/2019 4:00 PM Yolanda Wolfe  MRN:  [id number] Subjective: Major depression Principal Problem: Major depression recurrent  Today the patient is doing very well.  She is happily married.  She has lost 15 pounds due to changes in her diet and a little bit of exercising. Patient's mood is great.  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  Her mood is positive and optimistic.  She has good energy.  She drinks no alcohol.  She uses no drugs.  She gets a good night of sleep taking 2 mg of Ativan.  The patient is not in therapy.  She is very stable at this time.  She denies any chest discomfort shortness of breath or any signs of viral infection.  She works full-time. Patient Active Problem List   Diagnosis Date Noted  . Nephrolithiasis [N20.0] 10/31/2017  . Allergic rhinitis [J30.9] 08/16/2017  . Hyperlipidemia, mixed [E78.2] 08/16/2017  . Class 1 obesity with body mass index (BMI) of 30.0 to 30.9 in adult [E66.9, Z68.30] 08/16/2017  . Major depressive disorder, recurrent episode ([place]) [F33.9] 05/07/2015  . Weight gain [R63.5] 06/19/2014  . Depression [F32.9] 06/19/2014  . Routine general medical examination at a health care facility [Z00.00] 06/19/2014  . Insomnia [G47.00] 06/19/2014   Total Time spent with patient: 15 minutes  Past Psychiatric History:   Past Medical History:  Past Medical History:  Diagnosis Date  . Anxiety   . Blood transfusion without reported diagnosis   . Depression   . Frequent headaches   . GERD (gastroesophageal reflux disease)   . Heart murmur   . Migraine   . UTI (lower urinary tract infection)     Past Surgical History:  Procedure Laterality Date  . ABDOMINAL HYSTERECTOMY  2009  . CESAREAN SECTION     Family History:  Family History  Problem Relation Age of Onset  . Cancer Maternal Grandmother        breast  . Heart disease Maternal  Grandfather   . Alcohol abuse Paternal Grandfather   . Dementia Paternal Grandmother   . Cancer Mother   . Cancer Father    Family Psychiatric  History:  Social History:  Social History   Substance and Sexual Activity  Alcohol Use Yes  . Alcohol/week: 1.0 standard drink  . Types: 1 Glasses of wine per week     Social History   Substance and Sexual Activity  Drug Use No    Social History   Socioeconomic History  . Marital status: Married    Spouse name: Not on file  . Number of children: 1  . Years of education: Not on file  . Highest education level: Not on file  Occupational History  . Not on file  Tobacco Use  . Smoking status: Never Smoker  . Smokeless tobacco: Never Used  Vaping Use  . Vaping Use: Never used  Substance and Sexual Activity  . Alcohol use: Yes    Alcohol/week: 1.0 standard drink    Types: 1 Glasses of wine per week  . Drug use: No  . Sexual activity: Yes    Partners: Male    Birth control/protection: Surgical  Other Topics Concern  . Not on file  Social History Narrative  . Not on file   Social Determinants of Health   Financial Resource Strain:   . Difficulty of Paying Living Expenses:   Food Insecurity:   .  Worried About Charity fundraiser in the Last Year:   . Arboriculturist in the Last Year:   Transportation Needs:   . Film/video editor (Medical):   Marland Kitchen Lack of Transportation (Non-Medical):   Physical Activity:   . Days of Exercise per Week:   . Minutes of Exercise per Session:   Stress:   . Feeling of Stress :   Social Connections:   . Frequency of Communication with Friends and Family:   . Frequency of Social Gatherings with Friends and Family:   . Attends Religious Services:   . Active Member of Clubs or Organizations:   . Attends Archivist Meetings:   Marland Kitchen Marital Status:    Additional Social History:                         Sleep: Poor  Appetite:  Good  Current Medications: Current  Outpatient Medications  Medication Sig Dispense Refill  . amitriptyline (ELAVIL) 25 MG tablet Take 1 tablet (25 mg total) by mouth at bedtime. 30 tablet 1  . FLUoxetine (PROZAC) 20 MG capsule Take 1 capsule (20 mg total) by mouth daily. 90 capsule 2  . LORazepam (ATIVAN) 1 MG tablet 2  qhs 60 tablet 5  . tazarotene (AVAGE) 0.1 % cream APPLY 1 APPLICATION APPLY ON THE SKIN NIGHTLY  11   No current facility-administered medications for this visit.    Lab Results: No results found for this or any previous visit (from the past 48 hour(s)).  Blood Alcohol level:  No results found for: [facility]  Physical Findings: AIMS:  , ,  ,  ,    CIWA:    COWS:     Musculoskeletal: Strength & Muscle Tone: within normal limits Gait & Station Patient leans: N/A  Psychiatric Specialty Exam: ROS  There were no vitals taken for this visit.There is no height or weight on file to calculate BMI.  General Appearance: Fairly Groomed  Engineer, water::  Good  Speech:  Clear and Coherent  Volume:  Normal  Mood:  Euthymic  Affect:  Appropriate  Thought Process:  Coherent  Orientation:  Full (Time, Place, and Person)  Thought Content:  WDL  Suicidal Thoughts:  No  Homicidal Thoughts:  No  Memory:  NA  Judgement:  Good  Insight:  Fair  Psychomotor Activity:  Normal  Concentration:  Good  Recall:  Good  Fund of Knowledge:Good  Language: Good  Akathisia:  No  Handed:  Right  AIMS (if indicated):     Assets:  Financial Resources/Insurance  ADL's:  Intact  Cognition: WNL  Sleep:      Treatment Plan Summary: At this time the patient will continue taking Prozac 20 mg every day.  Her first problem is that of major depression.  Her second problem is that of insomnia.  Should continue taking Ativan 1 mg 2 at night.  This patient will be reevaluated in 5 months.  She is functioning extremely well. Jerral Ralph, MD 12/04/2019, 4:00 PM Patient ID: Yolanda Wolfe, female   DOB: 1976-01-04, 44 y.o.    MRN: [id number] [facility] MD Progress Note  12/04/2019 4:00 PM Yolanda Wolfe  MRN:  [id number] Subjective:  Spirits are good Principal Problem: Major depression, recurrent episode Diagnosis:  Major depression, recurrent episode + Total Time spent with patient: 15 minutes  Past Psychiatric History:   Past Medical History:  Past Medical History:  Diagnosis Date  .  Anxiety   . Blood transfusion without reported diagnosis   . Depression   . Frequent headaches   . GERD (gastroesophageal reflux disease)   . Heart murmur   . Migraine   . UTI (lower urinary tract infection)     Past Surgical History:  Procedure Laterality Date  . ABDOMINAL HYSTERECTOMY  2009  . CESAREAN SECTION     Family History:  Family History  Problem Relation Age of Onset  . Cancer Maternal Grandmother        breast  . Heart disease Maternal Grandfather   . Alcohol abuse Paternal Grandfather   . Dementia Paternal Grandmother   . Cancer Mother   . Cancer Father    Family Psychiatric  History:  Social History:  Social History   Substance and Sexual Activity  Alcohol Use Yes  . Alcohol/week: 1.0 standard drink  . Types: 1 Glasses of wine per week     Social History   Substance and Sexual Activity  Drug Use No    Social History   Socioeconomic History  . Marital status: Married    Spouse name: Not on file  . Number of children: 1  . Years of education: Not on file  . Highest education level: Not on file  Occupational History  . Not on file  Tobacco Use  . Smoking status: Never Smoker  . Smokeless tobacco: Never Used  Vaping Use  . Vaping Use: Never used  Substance and Sexual Activity  . Alcohol use: Yes    Alcohol/week: 1.0 standard drink    Types: 1 Glasses of wine per week  . Drug use: No  . Sexual activity: Yes    Partners: Male    Birth control/protection: Surgical  Other Topics Concern  . Not on file  Social History Narrative  . Not on file   Social Determinants of  Health   Financial Resource Strain:   . Difficulty of Paying Living Expenses:   Food Insecurity:   . Worried About Charity fundraiser in the Last Year:   . Arboriculturist in the Last Year:   Transportation Needs:   . Film/video editor (Medical):   Marland Kitchen Lack of Transportation (Non-Medical):   Physical Activity:   . Days of Exercise per Week:   . Minutes of Exercise per Session:   Stress:   . Feeling of Stress :   Social Connections:   . Frequency of Communication with Friends and Family:   . Frequency of Social Gatherings with Friends and Family:   . Attends Religious Services:   . Active Member of Clubs or Organizations:   . Attends Archivist Meetings:   Marland Kitchen Marital Status:    Additional Social History:                         Sleep: Poor  Appetite:  Good  Current Medications: Current Outpatient Medications  Medication Sig Dispense Refill  . amitriptyline (ELAVIL) 25 MG tablet Take 1 tablet (25 mg total) by mouth at bedtime. 30 tablet 1  . FLUoxetine (PROZAC) 20 MG capsule Take 1 capsule (20 mg total) by mouth daily. 90 capsule 2  . LORazepam (ATIVAN) 1 MG tablet 2  qhs 60 tablet 5  . tazarotene (AVAGE) 0.1 % cream APPLY 1 APPLICATION APPLY ON THE SKIN NIGHTLY  11   No current facility-administered medications for this visit.    Lab Results: No results found for this  or any previous visit (from the past 48 hour(s)).  Blood Alcohol level:  No results found for: [facility]  Physical Findings: AIMS:  , ,  ,  ,    CIWA:    COWS:     Musculoskeletal: Strength & Muscle Tone: within normal limits Gait & Station Patient leans: N/A  Psychiatric Specialty Exam: ROS  There were no vitals taken for this visit.There is no height or weight on file to calculate BMI.  General Appearance: Fairly Groomed  Engineer, water::  Good  Speech:  Clear and Coherent  Volume:  Normal  Mood:  Euthymic  Affect:  Appropriate  Thought Process:  Coherent  Orientation:   Full (Time, Place, and Person)  Thought Content:  WDL  Suicidal Thoughts:  No  Homicidal Thoughts:  No  Memory:  NA  Judgement:  Good  Insight:  Fair  Psychomotor Activity:  Normal  Concentration:  Good  Recall:  Good  Fund of Knowledge:Good  Language: Good  Akathisia:  No  Handed:  Right  AIMS (if indicated):     Assets:  Financial Resources/Insurance  ADL's:  Intact  Cognition: WNL  Sleep:      Treatment Plan Summary  I believe the patient is doing fairly well.  She wants to be challenged more.  I think she has to take more responsibility for treatment.  Ativan is at the highest dose that I will prescribe that is 2 mg at night.  She takes Prozac on a regular basis.  Her diagnosis is that of major depression and insomnia.  Her plans today is to give her the name of Dr. Pervis Hocking.  Patient will call her.  We will call her back and give her another appointment to see me in 3 months.  Patient is not suicidal.  Again she is functioning fairly well.

## 2019-12-09 ENCOUNTER — Other Ambulatory Visit: Payer: Self-pay

## 2019-12-10 ENCOUNTER — Ambulatory Visit (INDEPENDENT_AMBULATORY_CARE_PROVIDER_SITE_OTHER): Payer: Commercial Managed Care - PPO | Admitting: Family Medicine

## 2019-12-10 ENCOUNTER — Encounter: Payer: Self-pay | Admitting: Family Medicine

## 2019-12-10 VITALS — BP 122/84 | HR 92 | Temp 98.3°F | Resp 12 | Ht 64.0 in | Wt 171.5 lb

## 2019-12-10 DIAGNOSIS — R519 Headache, unspecified: Secondary | ICD-10-CM | POA: Diagnosis not present

## 2019-12-10 DIAGNOSIS — I83813 Varicose veins of bilateral lower extremities with pain: Secondary | ICD-10-CM | POA: Insufficient documentation

## 2019-12-10 DIAGNOSIS — R739 Hyperglycemia, unspecified: Secondary | ICD-10-CM

## 2019-12-10 LAB — POCT GLYCOSYLATED HEMOGLOBIN (HGB A1C): Hemoglobin A1C: 5.2 % (ref 4.0–5.6)

## 2019-12-10 MED ORDER — AMITRIPTYLINE HCL 25 MG PO TABS: 25.0000 mg | ORAL_TABLET | Freq: Every day | ORAL | 2 refills | Status: DC

## 2019-12-10 NOTE — Progress Notes (Signed)
HPI:  [name] is a 44 y.o. female, who is here today to follow on recent OV.  She was last seen on 11/04/19, when she was c/o worsening headaches. Hx of headache for years. She was having 2-4 headaches per week. Associated photo,phonophobia,and nausea. She started Amitriptyline 25 mg at bedtime. She has tolerated medication well.  She is taking Fluoxetine 20 mg for depression. Following with psychiatrist.  Sleeping much better, 8 hours. She has had 2 headaches since she started medication and milder.  C/O LE pain for a few months, attributed to varicose veins. Achy mild pain. Negative for numbness or tingling.  No edema or erythema. She has not identified exacerbating or alleviating factors. She has not tried OTC treatments.  In 10/2017 glucose elevated at 147. No hx of diabetes. She is following a healthful diet and has lost some wt. Negative for polydipsia,polyuria, or polyphagia.   Review of Systems  Constitutional: Negative for activity change, appetite change and fever.  HENT: Negative for mouth sores, nosebleeds and sore throat.   Eyes: Negative for redness and visual disturbance.  Respiratory: Negative for shortness of breath.   Cardiovascular: Negative for chest pain and palpitations.  Gastrointestinal: Negative for abdominal pain, nausea and vomiting.       Negative for changes in bowel habits.  Genitourinary: Negative for decreased urine volume and hematuria.  Musculoskeletal: Negative for gait problem.  Neurological: Negative for syncope and weakness.  Rest see pertinent positives and negatives per HPI.  Current Outpatient Medications on File Prior to Visit  Medication Sig Dispense Refill   FLUoxetine (PROZAC) 20 MG capsule Take 1 capsule (20 mg total) by mouth daily. 90 capsule 2   fluticasone (FLONASE) 50 MCG/ACT nasal spray fluticasone propionate 50 mcg/actuation nasal spray,suspension  SPRAY 2 SPRAYS INTO EACH NOSTRIL EVERY  DAY     LORazepam (ATIVAN) 1 MG tablet lorazepam 1 mg tablet   2 tablets every day by oral route.     tazarotene (AVAGE) 0.1 % cream APPLY 1 APPLICATION APPLY ON THE SKIN NIGHTLY  11   No current facility-administered medications on file prior to visit.   Past Medical History:  Diagnosis Date   Anxiety    Blood transfusion without reported diagnosis    Depression    Frequent headaches    GERD (gastroesophageal reflux disease)    Heart murmur    Migraine    UTI (lower urinary tract infection)    No Known Allergies  Social History   Socioeconomic History   Marital status: Married    Spouse name: Not on file   Number of children: 1   Years of education: Not on file   Highest education level: Not on file  Occupational History   Not on file  Tobacco Use   Smoking status: Never Smoker   Smokeless tobacco: Never Used  Vaping Use   Vaping Use: Never used  Substance and Sexual Activity   Alcohol use: Yes    Alcohol/week: 1.0 standard drink    Types: 1 Glasses of wine per week   Drug use: No   Sexual activity: Yes    Partners: Male    Birth control/protection: Surgical  Other Topics Concern   Not on file  Social History Narrative   Not on file   Social Determinants of Health   Financial Resource Strain:    Difficulty of Paying Living Expenses:   Food Insecurity:    Worried About Charity fundraiser in  the Last Year:    Arboriculturist in the Last Year:   Transportation Needs:    Film/video editor (Medical):    Lack of Transportation (Non-Medical):   Physical Activity:    Days of Exercise per Week:    Minutes of Exercise per Session:   Stress:    Feeling of Stress :   Social Connections:    Frequency of Communication with Friends and Family:    Frequency of Social Gatherings with Friends and Family:    Attends Religious Services:    Active Member of Clubs or Organizations:    Attends Archivist Meetings:     Marital Status:     Vitals:   12/10/19 0906  BP: 122/84  Pulse: 92  Resp: 12  Temp: 98.3 F (36.8 C)  SpO2: 96%   Wt Readings from Last 3 Encounters:  12/10/19 171 lb 8 oz (77.8 kg)  11/04/19 179 lb (81.2 kg)  08/13/18 183 lb (83 kg)    Body mass index is 29.44 kg/m.  Physical Exam  Nursing note and vitals reviewed. Constitutional: She is oriented to person, place, and time. She appears well-developed. No distress.  HENT:  Head: Normocephalic and atraumatic.  Eyes: Conjunctivae are normal.  Cardiovascular: Normal rate and regular rhythm.  No murmur heard. Pulses:      Dorsalis pedis pulses are 2+ on the right side and 2+ on the left side.  Varicose veins LE, bilateral.Telangiectasias L>R.  Respiratory: Effort normal and breath sounds normal. No respiratory distress.  GI: There is no hepatomegaly.  Lymphadenopathy:    She has no cervical adenopathy.  Neurological: She is alert and oriented to person, place, and time. No cranial nerve deficit. Gait normal.  Skin: Skin is warm. No rash noted. No erythema.  Psychiatric:  Well groomed, good eye contact.   ASSESSMENT AND PLAN:  Yolanda Wolfe was seen today for follow-up.  Diagnoses and all orders for this visit:  Orders Placed This Encounter  Procedures   Ambulatory referral to Vascular Surgery   POC HgB A1c   Lab Results  Component Value Date   HGBA1C 5.2 12/10/2019    Hyperglycemia Healthy life style for primary prevention.  Headache, unspecified headache type Great improvement, so continue amitriptyline 25 mg daily. Continue monitoring for new symptoms.  Varicose veins of both lower extremities with pain Problem is causing some pain in left LE. She would like to have a consultation with a pain specialist.  Return in about 6 months (around 06/10/2020) for cpe.   Yolanda Wolfe G. Martinique, MD  Yolanda Wolfe.   A few things to remember from today's visit:  No changes today. We  will check cholesterol next physical.  If you need refills please call your pharmacy. Do not use My Chart to request refills or for acute issues that need immediate attention.    Please be sure medication list is accurate. If a new problem present, please set up appointment sooner than planned today.

## 2019-12-10 NOTE — Assessment & Plan Note (Signed)
Problem is causing some pain in left LE. She would like to have a consultation with a pain specialist.

## 2019-12-10 NOTE — Assessment & Plan Note (Signed)
Great improvement, so continue amitriptyline 25 mg daily. Continue monitoring for new symptoms.

## 2019-12-10 NOTE — Patient Instructions (Signed)
A few things to remember from today's visit:   Varicose veins of both lower extremities with pain - Plan: Ambulatory referral to Vascular Surgery  Headache, unspecified headache type - Plan: amitriptyline (ELAVIL) 25 MG tablet  No changes today. We will check cholesterol next physical.  If you need refills please call your pharmacy. Do not use My Chart to request refills or for acute issues that need immediate attention.    Please be sure medication list is accurate. If a new problem present, please set up appointment sooner than planned today.

## 2020-01-10 ENCOUNTER — Ambulatory Visit: Payer: Commercial Managed Care - PPO | Admitting: Adult Health

## 2020-01-10 ENCOUNTER — Encounter: Payer: Self-pay | Admitting: Adult Health

## 2020-01-10 ENCOUNTER — Other Ambulatory Visit: Payer: Self-pay

## 2020-01-10 VITALS — BP 100/76 | Temp 98.7°F | Wt 169.0 lb

## 2020-01-10 DIAGNOSIS — M79605 Pain in left leg: Secondary | ICD-10-CM

## 2020-01-10 NOTE — Progress Notes (Signed)
Subjective:    Patient ID: Yolanda Wolfe, female    DOB: 04/22/1976, 44 y.o.   MRN: [id number]  HPI 44 year old female who  has a past medical history of Anxiety, Blood transfusion without reported diagnosis, Depression, Frequent headaches, GERD (gastroesophageal reflux disease), Heart murmur, Migraine, and UTI (lower urinary tract infection).  She is a patient of Dr. Martinique who I am seeing today for an acute issue of left leg pain.  She reports that she was recently on a 10-hour car trip from [place], did get out of the car multiple times during the trip.  Today she noticed pain on the lateral aspect of her lower left leg.  She has not noticed any bruising, redness, warmth, or swelling. She denies chest pain or shortness of breath    Review of Systems See HPI  Past Medical History:  Diagnosis Date   Anxiety    Blood transfusion without reported diagnosis    Depression    Frequent headaches    GERD (gastroesophageal reflux disease)    Heart murmur    Migraine    UTI (lower urinary tract infection)     Social History   Socioeconomic History   Marital status: Married    Spouse name: Not on file   Number of children: 1   Years of education: Not on file   Highest education level: Not on file  Occupational History   Not on file  Tobacco Use   Smoking status: Never Smoker   Smokeless tobacco: Never Used  Vaping Use   Vaping Use: Never used  Substance and Sexual Activity   Alcohol use: Yes    Alcohol/week: 1.0 standard drink    Types: 1 Glasses of wine per week   Drug use: No   Sexual activity: Yes    Partners: Male    Birth control/protection: Surgical  Other Topics Concern   Not on file  Social History Narrative   Not on file   Social Determinants of Health   Financial Resource Strain:    Difficulty of Paying Living Expenses:   Food Insecurity:    Worried About Charity fundraiser in the Last Year:    Arboriculturist in  the Last Year:   Transportation Needs:    Film/video editor (Medical):    Lack of Transportation (Non-Medical):   Physical Activity:    Days of Exercise per Week:    Minutes of Exercise per Session:   Stress:    Feeling of Stress :   Social Connections:    Frequency of Communication with Friends and Family:    Frequency of Social Gatherings with Friends and Family:    Attends Religious Services:    Active Member of Clubs or Organizations:    Attends Music therapist:    Marital Status:   Intimate Partner Violence:    Fear of Current or Ex-Partner:    Emotionally Abused:    Physically Abused:    Sexually Abused:     Past Surgical History:  Procedure Laterality Date   ABDOMINAL HYSTERECTOMY  2009   CESAREAN SECTION      Family History  Problem Relation Age of Onset   Cancer Maternal Grandmother        breast   Heart disease Maternal Grandfather    Alcohol abuse Paternal Grandfather    Dementia Paternal Grandmother    Cancer Mother    Cancer Father     No Known Allergies  Current Outpatient Medications on File Prior to Visit  Medication Sig Dispense Refill   amitriptyline (ELAVIL) 25 MG tablet Take 1 tablet (25 mg total) by mouth at bedtime. 90 tablet 2   FLUoxetine (PROZAC) 20 MG capsule Take 1 capsule (20 mg total) by mouth daily. 90 capsule 2   LORazepam (ATIVAN) 1 MG tablet lorazepam 1 mg tablet   2 tablets every day by oral route.     tazarotene (AVAGE) 0.1 % cream APPLY 1 APPLICATION APPLY ON THE SKIN NIGHTLY  11   fluticasone (FLONASE) 50 MCG/ACT nasal spray fluticasone propionate 50 mcg/actuation nasal spray,suspension  SPRAY 2 SPRAYS INTO EACH NOSTRIL EVERY DAY (Patient not taking: Reported on 01/10/2020)     No current facility-administered medications on file prior to visit.    BP 100/76    Temp 98.7 F (37.1 C)    Wt 169 lb (76.7 kg)    BMI 29.01 kg/m       Objective:   Physical Exam Vitals and  nursing note reviewed.  Constitutional:      Appearance: Normal appearance.  Musculoskeletal:        General: Tenderness (She has tenderness along the extensor digitorum longus muscle well his extensor longus tendon.  No calf pain, redness, or warmth noted) present. No swelling. Normal range of motion.  Skin:    General: Skin is warm and dry.     Capillary Refill: Capillary refill takes less than 2 seconds.  Neurological:     General: No focal deficit present.     Mental Status: She is oriented to person, place, and time.  Psychiatric:        Mood and Affect: Mood normal.        Behavior: Behavior normal.        Thought Content: Thought content normal.        Judgment: Judgment normal.       Assessment & Plan:  1. Left leg pain -Not concerned for DVT at this time.  Likely soft tissue strain.  Advised ice and Motrin if needed.  Reviewed red flags and if she develops any over the weekend she will go to the emergency room  Dorothyann Peng, NP

## 2020-02-03 ENCOUNTER — Other Ambulatory Visit: Payer: Self-pay

## 2020-02-03 DIAGNOSIS — I83813 Varicose veins of bilateral lower extremities with pain: Secondary | ICD-10-CM

## 2020-02-05 ENCOUNTER — Other Ambulatory Visit: Payer: Self-pay

## 2020-02-05 ENCOUNTER — Encounter: Payer: Self-pay | Admitting: Family Medicine

## 2020-02-05 ENCOUNTER — Ambulatory Visit: Payer: Commercial Managed Care - PPO | Admitting: Family Medicine

## 2020-02-05 VITALS — BP 126/70 | HR 74 | Temp 97.7°F | Resp 12 | Ht 64.0 in | Wt 172.0 lb

## 2020-02-05 DIAGNOSIS — Z1329 Encounter for screening for other suspected endocrine disorder: Secondary | ICD-10-CM | POA: Diagnosis not present

## 2020-02-05 DIAGNOSIS — Z0000 Encounter for general adult medical examination without abnormal findings: Secondary | ICD-10-CM | POA: Diagnosis not present

## 2020-02-05 DIAGNOSIS — E782 Mixed hyperlipidemia: Secondary | ICD-10-CM

## 2020-02-05 DIAGNOSIS — Z1159 Encounter for screening for other viral diseases: Secondary | ICD-10-CM

## 2020-02-05 DIAGNOSIS — Z130 Encounter for screening for diseases of the blood and blood-forming organs and certain disorders involving the immune mechanism: Secondary | ICD-10-CM

## 2020-02-05 DIAGNOSIS — Z13228 Encounter for screening for other metabolic disorders: Secondary | ICD-10-CM

## 2020-02-05 NOTE — Progress Notes (Signed)
HPI: Yolanda Wolfe is a 44 y.o. female, who is here today for her routine physical.  Last CPE: 2019.  Regular exercise 3 or more time per week: Walking a few times per. Following a healthy diet: Yes. She lives with her husband and 62 year old daughter part time.  Chronic medical problems: Hyperlipidemia, allergic rhinitis, depression (he follows with psychiatrist, Dr. Casimiro Needle).  Pap smear: 11/05/2019. She follows with gynecology regularly.  Immunization History  Administered Date(s) Administered  . Influenza,inj,Quad PF,6+ Mos 03/20/2014, 02/04/2019  . Moderna SARS-COVID-2 Vaccination 08/12/2019, 09/17/2019  . Tdap 12/10/2011   Mammogram: 11/06/2019. Colonoscopy: N/A DEXA: N/A Sleeps about 8 hours. No history of tobacco use or high alcohol intake. Hep C screening: Negative.  HLD: She is on nonpharmacologic treatment. Lab Results  Component Value Date   CHOL 199 08/22/2017   HDL 30.50 (L) 08/22/2017   LDLDIRECT 135.0 08/22/2017   TRIG 220.0 (H) 08/22/2017   CHOLHDL 7 08/22/2017   She has no concerns today.  Review of Systems  Constitutional: Negative for appetite change, fatigue and fever.  HENT: Negative for dental problem, hearing loss, mouth sores and sore throat.   Eyes: Negative for redness and visual disturbance.  Respiratory: Negative for cough, shortness of breath and wheezing.   Cardiovascular: Negative for chest pain and leg swelling.  Gastrointestinal: Negative for abdominal pain, nausea and vomiting.       No changes in bowel habits.  Endocrine: Negative for cold intolerance, heat intolerance, polydipsia, polyphagia and polyuria.  Genitourinary: Negative for decreased urine volume, dysuria, hematuria, vaginal bleeding and vaginal discharge.  Musculoskeletal: Negative for arthralgias, gait problem and myalgias.  Skin: Negative for color change and rash.  Allergic/Immunologic: Positive for environmental allergies.  Neurological: Negative  for syncope, weakness and headaches.  Hematological: Negative for adenopathy. Does not bruise/bleed easily.  Psychiatric/Behavioral: Negative for confusion and sleep disturbance.  All other systems reviewed and are negative.  Current Outpatient Medications on File Prior to Visit  Medication Sig Dispense Refill  . amitriptyline (ELAVIL) 25 MG tablet Take 1 tablet (25 mg total) by mouth at bedtime. 90 tablet 2  . FLUoxetine (PROZAC) 20 MG capsule Take 1 capsule (20 mg total) by mouth daily. 90 capsule 2  . LORazepam (ATIVAN) 1 MG tablet lorazepam 1 mg tablet   2 tablets every day by oral route.    . tazarotene (AVAGE) 0.1 % cream APPLY 1 APPLICATION APPLY ON THE SKIN NIGHTLY  11   No current facility-administered medications on file prior to visit.    Past Medical History:  Diagnosis Date  . Anxiety   . Blood transfusion without reported diagnosis   . Depression   . Frequent headaches   . GERD (gastroesophageal reflux disease)   . Heart murmur   . Migraine   . UTI (lower urinary tract infection)     Past Surgical History:  Procedure Laterality Date  . ABDOMINAL HYSTERECTOMY  2009  . CESAREAN SECTION      No Known Allergies  Family History  Problem Relation Age of Onset  . Cancer Maternal Grandmother        breast  . Heart disease Maternal Grandfather   . Alcohol abuse Paternal Grandfather   . Dementia Paternal Grandmother   . Cancer Mother   . Cancer Father     Social History   Socioeconomic History  . Marital status: Married    Spouse name: Not on file  . Number of children: 1  . Years of  education: Not on file  . Highest education level: Not on file  Occupational History  . Not on file  Tobacco Use  . Smoking status: Never Smoker  . Smokeless tobacco: Never Used  Vaping Use  . Vaping Use: Never used  Substance and Sexual Activity  . Alcohol use: Yes    Alcohol/week: 1.0 standard drink    Types: 1 Glasses of wine per week  . Drug use: No  . Sexual  activity: Yes    Partners: Male    Birth control/protection: Surgical  Other Topics Concern  . Not on file  Social History Narrative  . Not on file   Social Determinants of Health   Financial Resource Strain:   . Difficulty of Paying Living Expenses:   Food Insecurity:   . Worried About Charity fundraiser in the Last Year:   . Arboriculturist in the Last Year:   Transportation Needs:   . Film/video editor (Medical):   Marland Kitchen Lack of Transportation (Non-Medical):   Physical Activity:   . Days of Exercise per Week:   . Minutes of Exercise per Session:   Stress:   . Feeling of Stress :   Social Connections:   . Frequency of Communication with Friends and Family:   . Frequency of Social Gatherings with Friends and Family:   . Attends Religious Services:   . Active Member of Clubs or Organizations:   . Attends Archivist Meetings:   Marland Kitchen Marital Status:    Vitals:   02/05/20 0816  BP: 126/70  Pulse: 74  Resp: 12  Temp: 97.7 F (36.5 C)  SpO2: 99%   Body mass index is 29.52 kg/m.  Wt Readings from Last 3 Encounters:  02/05/20 172 lb (78 kg)  01/10/20 169 lb (76.7 kg)  12/10/19 171 lb 8 oz (77.8 kg)   Physical Exam Vitals and nursing note reviewed.  Constitutional:      General: She is not in acute distress.    Appearance: She is well-developed.  HENT:     Head: Normocephalic and atraumatic.     Right Ear: Hearing, tympanic membrane, ear canal and external ear normal.     Left Ear: Hearing, tympanic membrane, ear canal and external ear normal.     Mouth/Throat:     Mouth: Mucous membranes are moist.     Tongue: No lesions.     Pharynx: Oropharynx is clear. Uvula midline.  Eyes:     Extraocular Movements: Extraocular movements intact.     Conjunctiva/sclera: Conjunctivae normal.     Pupils: Pupils are equal, round, and reactive to light.  Neck:     Thyroid: No thyromegaly.     Trachea: No tracheal deviation.  Cardiovascular:     Rate and Rhythm:  Normal rate and regular rhythm.     Pulses:          Dorsalis pedis pulses are 2+ on the right side and 2+ on the left side.     Heart sounds: No murmur heard.   Pulmonary:     Effort: Pulmonary effort is normal. No respiratory distress.     Breath sounds: Normal breath sounds.  Abdominal:     Palpations: Abdomen is soft. There is no hepatomegaly or mass.     Tenderness: There is no abdominal tenderness.  Genitourinary:    Comments: Deferred to gyn. Musculoskeletal:     Comments: No major deformity or signs of synovitis appreciated.  Lymphadenopathy:  Cervical: No cervical adenopathy.     Upper Body:     Right upper body: No supraclavicular adenopathy.     Left upper body: No supraclavicular adenopathy.  Skin:    General: Skin is warm.     Findings: No erythema or rash.  Neurological:     General: No focal deficit present.     Mental Status: She is alert and oriented to person, place, and time.     Cranial Nerves: No cranial nerve deficit.     Coordination: Coordination normal.     Gait: Gait normal.     Deep Tendon Reflexes:     Reflex Scores:      Bicep reflexes are 2+ on the right side and 2+ on the left side.      Patellar reflexes are 2+ on the right side and 2+ on the left side. Psychiatric:        Mood and Affect: Mood and affect normal.     Comments: Well groomed, good eye contact.   ASSESSMENT AND PLAN:  Ms. Lilya Smitherman was here today annual physical examination.  Orders Placed This Encounter  Procedures  . Basic metabolic panel  . Lipid panel  . Hepatitis C antibody screen   Lab Results  Component Value Date   CHOL 190 02/05/2020   HDL 37 (L) 02/05/2020   LDLCALC 115 (H) 02/05/2020   LDLDIRECT 135.0 08/22/2017   TRIG 265 (H) 02/05/2020   CHOLHDL 5.1 (H) 02/05/2020   Lab Results  Component Value Date   CREATININE 0.82 02/05/2020   BUN 11 02/05/2020   NA 136 02/05/2020   K 4.5 02/05/2020   CL 102 02/05/2020   CO2 27 02/05/2020     Routine general medical examination at a health care facility Regular physical activity and healthy diet for prevention of chronic illness and/or complications. Preventive guidelines reviewed. Vaccination up to date. Continue following with gyn for her female preventive care. Next CPE in a year.  The 10-year ASCVD risk score Mikey Bussing DC Brooke Bonito., et al., 2013) is: 1.2%   Values used to calculate the score:     Age: 44 years     Sex: Female     Is Non-Hispanic African American: No     Diabetic: No     Tobacco smoker: No     Systolic Blood Pressure: 086 mmHg     Is BP treated: No     HDL Cholesterol: 37 mg/dL     Total Cholesterol: 190 mg/dL  Hyperlipidemia, unspecified hyperlipidemia type Mild. Continue low fat diet. Further recommendations according to 10 years CVD risk score.  Encounter for HCV screening test for low risk patient -     Hepatitis C antibody screen  Screening for endocrine, metabolic and immunity disorder -     Basic metabolic panel   Return in 1 year (on 02/04/2021) for CPE.   [place] Jenifer G. Martinique, MD  [facility]. [place] office.   Today you have you routine preventive visit. A few things to remember from today's visit:   Routine general medical examination at a health care facility  Hyperlipidemia, unspecified hyperlipidemia type - Plan: Lipid panel  Encounter for HCV screening test for low risk patient - Plan: Hepatitis C antibody screen  Screening for endocrine, metabolic and immunity disorder - Plan: Basic metabolic panel  If you need refills please call your pharmacy. Do not use My Chart to request refills or for acute issues that need immediate attention.  Please be sure medication list is accurate. If a new problem present, please set up appointment sooner than planned today.   At least 150 minutes of moderate exercise per week, daily brisk walking for 15-30 min is a good exercise option. Healthy diet low in saturated (animal)  fats and sweets and consisting of fresh fruits and vegetables, lean meats such as fish and white chicken and whole grains.  These are some of recommendations for screening depending of age and risk factors:  - Vaccines:  Tdap vaccine every 10 years.  Shingles vaccine recommended at age 68, could be given after 42 years of age but not sure about insurance coverage.   Pneumonia vaccines: Pneumovax at 11. Sometimes Pneumovax is giving earlier if history of smoking, lung disease,diabetes,kidney disease among some.  Screening for diabetes at age 74 and every 3 years.  Cervical cancer prevention:  Pap smear starts at 42 years of age and continues periodically until 42 years old in low risk women. Pap smear every 3 years between 32 and 46 years old. Pap smear every 3-5 years between women 91 and older if pap smear negative and HPV screening negative.   -Breast cancer: Mammogram: There is disagreement between experts about when to start screening in low risk asymptomatic female but recent recommendations are to start screening at 75 and not later than 42 years old , every 1-2 years and after 42 yo q 2 years. Screening is recommended until 42 years old but some women can continue screening depending of healthy issues.  Colon cancer screening: Has been recently changed to 42 yo. Insurance may not cover until you are 42 years old. Screening is recommended until 42 years old.  Cholesterol disorder screening at age 44 and every 3 years.N/A  Also recommended:  1. Dental visit- Brush and floss your teeth twice daily; visit your dentist twice a year. 2. Eye doctor- Get an eye exam at least every 2 years. 3. Helmet use- Always wear a helmet when riding a bicycle, motorcycle, rollerblading or skateboarding. 4. Safe sex- If you may be exposed to sexually transmitted infections, use a condom. 5. Seat belts- Seat belts can save your live; always wear one. 6. Smoke/Carbon Monoxide detectors- These  detectors need to be installed on the appropriate level of your home. Replace batteries at least once a year. 7. Skin cancer- When out in the sun please cover up and use sunscreen 15 SPF or higher. 8. Violence- If anyone is threatening or hurting you, please tell your healthcare provider.  9. Drink alcohol in moderation- Limit alcohol intake to one drink or less per day. Never drink and drive. 10. Calcium supplementation 1000 to 1200 mg daily, ideally through your diet.  Vitamin D supplementation 800 units daily.

## 2020-02-05 NOTE — Patient Instructions (Addendum)
Today you have you routine preventive visit. A few things to remember from today's visit:   Routine general medical examination at a health care facility  Hyperlipidemia, unspecified hyperlipidemia type - Plan: Lipid panel  Encounter for HCV screening test for low risk patient - Plan: Hepatitis C antibody screen  Screening for endocrine, metabolic and immunity disorder - Plan: Basic metabolic panel  If you need refills please call your pharmacy. Do not use My Chart to request refills or for acute issues that need immediate attention.    Please be sure medication list is accurate. If a new problem present, please set up appointment sooner than planned today.   At least 150 minutes of moderate exercise per week, daily brisk walking for 15-30 min is a good exercise option. Healthy diet low in saturated (animal) fats and sweets and consisting of fresh fruits and vegetables, lean meats such as fish and white chicken and whole grains.  These are some of recommendations for screening depending of age and risk factors:  - Vaccines:  Tdap vaccine every 10 years.  Shingles vaccine recommended at age 53, could be given after 42 years of age but not sure about insurance coverage.   Pneumonia vaccines: Pneumovax at 16. Sometimes Pneumovax is giving earlier if history of smoking, lung disease,diabetes,kidney disease among some.  Screening for diabetes at age 50 and every 3 years.  Cervical cancer prevention:  Pap smear starts at 42 years of age and continues periodically until 42 years old in low risk women. Pap smear every 3 years between 24 and 38 years old. Pap smear every 3-5 years between women 66 and older if pap smear negative and HPV screening negative.   -Breast cancer: Mammogram: There is disagreement between experts about when to start screening in low risk asymptomatic female but recent recommendations are to start screening at 60 and not later than 42 years old , every 1-2  years and after 42 yo q 2 years. Screening is recommended until 42 years old but some women can continue screening depending of healthy issues.  Colon cancer screening: Has been recently changed to 42 yo. Insurance may not cover until you are 42 years old. Screening is recommended until 42 years old.  Cholesterol disorder screening at age 25 and every 3 years.N/A  Also recommended:  1. Dental visit- Brush and floss your teeth twice daily; visit your dentist twice a year. 2. Eye doctor- Get an eye exam at least every 2 years. 3. Helmet use- Always wear a helmet when riding a bicycle, motorcycle, rollerblading or skateboarding. 4. Safe sex- If you may be exposed to sexually transmitted infections, use a condom. 5. Seat belts- Seat belts can save your live; always wear one. 6. Smoke/Carbon Monoxide detectors- These detectors need to be installed on the appropriate level of your home. Replace batteries at least once a year. 7. Skin cancer- When out in the sun please cover up and use sunscreen 15 SPF or higher. 8. Violence- If anyone is threatening or hurting you, please tell your healthcare provider.  9. Drink alcohol in moderation- Limit alcohol intake to one drink or less per day. Never drink and drive. 10. Calcium supplementation 1000 to 1200 mg daily, ideally through your diet.  Vitamin D supplementation 800 units daily.

## 2020-02-06 LAB — BASIC METABOLIC PANEL
BUN: 11 mg/dL (ref 7–25)
CO2: 27 mmol/L (ref 20–32)
Calcium: 8.7 mg/dL (ref 8.6–10.2)
Chloride: 102 mmol/L (ref 98–110)
Creat: 0.82 mg/dL (ref 0.50–1.10)
Glucose, Bld: 91 mg/dL (ref 65–99)
Potassium: 4.5 mmol/L (ref 3.5–5.3)
Sodium: 136 mmol/L (ref 135–146)

## 2020-02-06 LAB — LIPID PANEL
Cholesterol: 190 mg/dL (ref <200)
HDL: 37 mg/dL — ABNORMAL LOW (ref >=50)
LDL Cholesterol (Calc): 115 mg/dL (calc) — ABNORMAL HIGH
Non-HDL Cholesterol (Calc): 153 mg/dL (calc) — ABNORMAL HIGH (ref <130)
Total CHOL/HDL Ratio: 5.1 (calc) — ABNORMAL HIGH (ref <5.0)
Triglycerides: 265 mg/dL — ABNORMAL HIGH (ref <150)

## 2020-02-06 LAB — HEPATITIS C ANTIBODY
Hepatitis C Ab: NONREACTIVE
SIGNAL TO CUT-OFF: 0 (ref <1.00)

## 2020-02-09 ENCOUNTER — Encounter: Payer: Self-pay | Admitting: Family Medicine

## 2020-02-10 ENCOUNTER — Other Ambulatory Visit: Payer: Commercial Managed Care - PPO

## 2020-02-10 ENCOUNTER — Other Ambulatory Visit: Payer: Self-pay

## 2020-02-10 DIAGNOSIS — Z20822 Contact with and (suspected) exposure to covid-19: Secondary | ICD-10-CM

## 2020-02-11 ENCOUNTER — Encounter (HOSPITAL_COMMUNITY): Payer: Commercial Managed Care - PPO

## 2020-02-12 LAB — NOVEL CORONAVIRUS, NAA: SARS-CoV-2, NAA: NOT DETECTED

## 2020-02-12 LAB — SARS-COV-2, NAA 2 DAY TAT

## 2020-02-14 ENCOUNTER — Encounter: Payer: Self-pay | Admitting: Family Medicine

## 2020-02-17 ENCOUNTER — Other Ambulatory Visit: Payer: Commercial Managed Care - PPO

## 2020-02-25 DIAGNOSIS — R519 Headache, unspecified: Secondary | ICD-10-CM

## 2020-02-25 MED ORDER — AMITRIPTYLINE HCL 25 MG PO TABS: 50.0000 mg | ORAL_TABLET | Freq: Every day | ORAL | 2 refills | Status: DC

## 2020-02-25 NOTE — Telephone Encounter (Signed)
[facility] [place], [place] Phone:  [phone]  Fax:  [phone]     Patient needs a new Rx sent to the pharmacy for her headache medication.

## 2020-03-10 ENCOUNTER — Other Ambulatory Visit: Payer: Commercial Managed Care - PPO

## 2020-03-10 DIAGNOSIS — Z20822 Contact with and (suspected) exposure to covid-19: Secondary | ICD-10-CM

## 2020-03-12 LAB — SARS-COV-2, NAA 2 DAY TAT

## 2020-03-12 LAB — NOVEL CORONAVIRUS, NAA: SARS-CoV-2, NAA: NOT DETECTED

## 2020-03-17 ENCOUNTER — Other Ambulatory Visit: Payer: Self-pay

## 2020-03-17 DIAGNOSIS — I83813 Varicose veins of bilateral lower extremities with pain: Secondary | ICD-10-CM

## 2020-03-18 ENCOUNTER — Ambulatory Visit (INDEPENDENT_AMBULATORY_CARE_PROVIDER_SITE_OTHER): Payer: Commercial Managed Care - PPO | Admitting: Physician Assistant

## 2020-03-18 ENCOUNTER — Ambulatory Visit (HOSPITAL_COMMUNITY)
Admission: RE | Admit: 2020-03-18 | Discharge: 2020-03-18 | Disposition: A | Discharge status: Home / Self Care | Payer: Commercial Managed Care - PPO | Source: Ambulatory Visit | Attending: Vascular Surgery | Admitting: Vascular Surgery

## 2020-03-18 ENCOUNTER — Other Ambulatory Visit: Payer: Self-pay

## 2020-03-18 ENCOUNTER — Encounter: Payer: Self-pay | Admitting: Physician Assistant

## 2020-03-18 VITALS — BP 114/81 | HR 84 | Temp 98.7°F | Resp 20 | Ht 64.0 in | Wt 171.8 lb

## 2020-03-18 DIAGNOSIS — I781 Nevus, non-neoplastic: Secondary | ICD-10-CM

## 2020-03-18 DIAGNOSIS — I872 Venous insufficiency (chronic) (peripheral): Secondary | ICD-10-CM | POA: Diagnosis not present

## 2020-03-18 DIAGNOSIS — I83813 Varicose veins of bilateral lower extremities with pain: Secondary | ICD-10-CM

## 2020-03-18 DIAGNOSIS — I8393 Asymptomatic varicose veins of bilateral lower extremities: Secondary | ICD-10-CM

## 2020-03-18 NOTE — Progress Notes (Signed)
[facility]   Reason for referral: painful fullness left > right LE with telangectasia B leg  History of Present Illness  Yolanda Wolfe is a 44 y.o. female who presents with chief complaint: left > right pain and fullness in B LE.  Patient notes, onset of problems after giving birth to her daughter who is now 30 y/o she developed compartment syndrome and had fasciotomies.   She noticed pain, heaviness and telangectasia formation medial below the knees, anterior lateral thighs.  Her symptoms are associated with prolonged sitting and standing.  The patient has had no history of DVT, no history of varicose vein, no history of venous stasis ulcers, no history of  Lymphedema and positive history of skin changes in lower legs.  There is a family history of venous disorders.  The patient has  used compression stockings in the past after laser sclera therapy treatments with recurrence of telangectasia.    She exercises 3 times a week and works full time.    Past Medical History:  Diagnosis Date  . Anxiety   . Blood transfusion without reported diagnosis   . Depression   . Frequent headaches   . GERD (gastroesophageal reflux disease)   . Heart murmur   . Migraine   . UTI (lower urinary tract infection)     Past Surgical History:  Procedure Laterality Date  . ABDOMINAL HYSTERECTOMY  2009  . CESAREAN SECTION      Social History   Socioeconomic History  . Marital status: Married    Spouse name: Not on file  . Number of children: 1  . Years of education: Not on file  . Highest education level: Not on file  Occupational History  . Not on file  Tobacco Use  . Smoking status: Never Smoker  . Smokeless tobacco: Never Used  Vaping Use  . Vaping Use: Never used  Substance and Sexual Activity  . Alcohol use: Yes    Alcohol/week: 1.0 standard drink    Types: 1 Glasses of wine per week  . Drug use: No  . Sexual activity: Yes    Partners: Male     Birth control/protection: Surgical  Other Topics Concern  . Not on file  Social History Narrative  . Not on file   Social Determinants of Health   Financial Resource Strain:   . Difficulty of Paying Living Expenses: Not on file  Food Insecurity:   . Worried About Charity fundraiser in the Last Year: Not on file  . Ran Out of Food in the Last Year: Not on file  Transportation Needs:   . Lack of Transportation (Medical): Not on file  . Lack of Transportation (Non-Medical): Not on file  Physical Activity:   . Days of Exercise per Week: Not on file  . Minutes of Exercise per Session: Not on file  Stress:   . Feeling of Stress : Not on file  Social Connections:   . Frequency of Communication with Friends and Family: Not on file  . Frequency of Social Gatherings with Friends and Family: Not on file  . Attends Religious Services: Not on file  . Active Member of Clubs or Organizations: Not on file  . Attends Archivist Meetings: Not on file  . Marital Status: Not on file  Intimate Partner Violence:   . Fear of Current or Ex-Partner: Not on file  . Emotionally Abused: Not on file  . Physically Abused: Not on file  .  Sexually Abused: Not on file    Family History  Problem Relation Age of Onset  . Cancer Maternal Grandmother        breast  . Heart disease Maternal Grandfather   . Alcohol abuse Paternal Grandfather   . Dementia Paternal Grandmother   . Cancer Mother   . Cancer Father     Current Outpatient Medications on File Prior to Visit  Medication Sig Dispense Refill  . amitriptyline (ELAVIL) 25 MG tablet Take 2 tablets (50 mg total) by mouth at bedtime. 180 tablet 2  . CLARAVIS 40 MG capsule Take 40 mg by mouth daily.    Marland Kitchen FLUoxetine (PROZAC) 20 MG capsule Take 1 capsule (20 mg total) by mouth daily. 90 capsule 2  . LORazepam (ATIVAN) 1 MG tablet lorazepam 1 mg tablet   2 tablets every day by oral route.     No current facility-administered medications on  file prior to visit.    Allergies as of 03/18/2020  . (No Known Allergies)     ROS:   General:  No weight loss, Fever, chills  HEENT: No recent headaches, no nasal bleeding, no visual changes, no sore throat  Neurologic: No dizziness, blackouts, seizures. No recent symptoms of stroke or mini- stroke. No recent episodes of slurred speech, or temporary blindness.  Cardiac: No recent episodes of chest pain/pressure, no shortness of breath at rest.  No shortness of breath with exertion.  Denies history of atrial fibrillation or irregular heartbeat  Vascular: No history of rest pain in feet.  No history of claudication.  No history of non-healing ulcer, No history of DVT   Pulmonary: No home oxygen, no productive cough, no hemoptysis,  No asthma or wheezing  Musculoskeletal:  [ ]  Arthritis, [ ]  Low back pain,  [ ]  Joint pain  Hematologic:No history of hypercoagulable state.  No history of easy bleeding.  No history of anemia  Gastrointestinal: No hematochezia or melena,  No gastroesophageal reflux, no trouble swallowing  Urinary: [ ]  chronic Kidney disease, [ ]  on HD - [ ]  MWF or [ ]  TTHS, [ ]  Burning with urination, [ ]  Frequent urination, [ ]  Difficulty urinating;   Skin: No rashes  Psychological: No history of anxiety,  positive history of depression  Physical Examination  Vitals:   03/18/20 1448  BP: 114/81  Pulse: 84  Resp: 20  Temp: 98.7 F (37.1 C)  TempSrc: Temporal  SpO2: 97%  Weight: 171 lb 12.8 oz (77.9 kg)  Height: 5\' 4"  (1.626 m)    Body mass index is 29.49 kg/m.  General:  Alert and oriented, no acute distress HEENT: Normal Neck: No bruit or JVD Pulmonary: Clear to auscultation bilaterally Cardiac: Regular Rate and Rhythm without murmur Abdomen: Soft, non-tender, non-distended, no mass, no scars Skin: No rash Extremity Pulses:  2+ radial, brachial, femoral, dorsalis pedis, posterior tibial pulses bilaterally Musculoskeletal:anterior tibial well  healed scars from former fasciotomy sites Neurologic: Upper and lower extremity motor 5/5 and symmetric.  Weak dorsiflex B   DATA:   Venous Reflux Times  +--------------+---------+------+-----------+------------+--------+  RIGHT     Reflux NoRefluxReflux TimeDiameter cmsComments               Yes                   +--------------+---------+------+-----------+------------+--------+  CFV            yes  >1 second             +--------------+---------+------+-----------+------------+--------+  FV mid    no                         +--------------+---------+------+-----------+------------+--------+  Popliteal   no                         +--------------+---------+------+-----------+------------+--------+  GSV at SFJ        yes  >500 ms   0.73        +--------------+---------+------+-----------+------------+--------+  GSV prox thigh      yes  >500 ms   0.52        +--------------+---------+------+-----------+------------+--------+  GSV mid thigh       yes  >500 ms   0.471        +--------------+---------+------+-----------+------------+--------+  GSV dist thigh      yes  >500 ms   0.524        +--------------+---------+------+-----------+------------+--------+  GSV at knee        yes  >500 ms   0.388        +--------------+---------+------+-----------+------------+--------+  GSV prox calf no               0.3         +--------------+---------+------+-----------+------------+--------+  SSV Pop Fossa no               0.312        +--------------+---------+------+-----------+------------+--------+  SSV prox calf       yes  >500 ms   0.227         +--------------+---------+------+-----------+------------+--------+  SSV mid calf       yes  >500 ms   0.169        +--------------+---------+------+-----------+------------+--------+     +--------------+---------+------+-----------+------------+-----------------  ----+  LEFT     Reflux NoRefluxReflux TimeDiameter cmsComments                      Yes                          +--------------+---------+------+-----------+------------+-----------------  ----+  CFV            yes  >1 second                    +--------------+---------+------+-----------+------------+-----------------  ----+  FV mid    no                                +--------------+---------+------+-----------+------------+-----------------  ----+  Popliteal   no                                +--------------+---------+------+-----------+------------+-----------------  ----+  GSV at [facility]  no               0.888  may be due to  poor                               valsalva         +--------------+---------+------+-----------+------------+-----------------  ----+  GSV prox thigh      yes  >500 ms   0.479               +--------------+---------+------+-----------+------------+-----------------  ----+  GSV mid thigh       yes  >500 ms   0.374               +--------------+---------+------+-----------+------------+-----------------  ----+  GSV dist thigh      yes  >500 ms   0.447               +--------------+---------+------+-----------+------------+-----------------  ----+  GSV at knee        yes  >500 ms   0.378                +--------------+---------+------+-----------+------------+-----------------  ----+  GSV prox calf       yes  >500 ms   0.369               +--------------+---------+------+-----------+------------+-----------------  ----+  SSV Pop Fossa no               0.18               +--------------+---------+------+-----------+------------+-----------------  ----+  SSV prox calf       yes  >500 ms   0.266               +--------------+---------+------+-----------+------------+-----------------  ----+  SSV mid calf no               0.137               +--------------+---------+------+-----------+------------+-----------------  ----+   Summary:  Right:  - No evidence of deep vein thrombosis seen in the right lower extremity,  from the common femoral through the popliteal veins.  - No evidence of superficial venous thrombosis in the right lower  extremity.  - Deep vein reflux in the CFV.  - Superficial vein reflux in the SSV mid and proximal calf, the SFJ. and  GSV to the knee.     Left:  - No evidence of deep vein thrombosis seen in the left lower extremity,  from the common femoral through the popliteal veins.  - No evidence of superficial venous thrombosis in the left lower  extremity.  - Deep vein reflux in the CFV.  - Superficial vein reflux in the SSV in the proximal calf, and the GSV to  the proximal calf. No reflux was observed in the SFJ, however this may be  due to poor valsalva.      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  Continue exercise as tolerates and elevation  when at rest.   F/U in 3 months for consideration of laser ablation therapy followed by sclero theapy.    Roxy Horseman PA-C [facility] Office: [phone]  MD in clinic [place]

## 2020-04-08 ENCOUNTER — Other Ambulatory Visit (HOSPITAL_COMMUNITY): Payer: Self-pay | Admitting: Psychiatry

## 2020-04-27 ENCOUNTER — Encounter: Payer: Self-pay | Admitting: Family Medicine

## 2020-04-27 ENCOUNTER — Telehealth (INDEPENDENT_AMBULATORY_CARE_PROVIDER_SITE_OTHER): Payer: Commercial Managed Care - PPO | Admitting: Family Medicine

## 2020-04-27 VITALS — Ht 64.0 in

## 2020-04-27 DIAGNOSIS — R519 Headache, unspecified: Secondary | ICD-10-CM | POA: Diagnosis not present

## 2020-04-27 MED ORDER — TOPIRAMATE 50 MG PO TABS: 50.0000 mg | ORAL_TABLET | Freq: Every day | ORAL | 1 refills | Status: DC

## 2020-04-27 MED ORDER — AMITRIPTYLINE HCL 25 MG PO TABS: 50.0000 mg | ORAL_TABLET | Freq: Every day | ORAL | 0 refills | Status: DC

## 2020-04-27 NOTE — Progress Notes (Signed)
Virtual Visit via Video Note I connected with Yolanda Wolfe on 04/27/20 by a video enabled telemedicine application and verified that I am speaking with the correct person using two identifiers.  Location patient: home Location provider:work office Persons participating in the virtual visit: patient, provider  I discussed the limitations of evaluation and management by telemedicine and the availability of in person appointments. The patient expressed understanding and agreed to proceed.  Chief Complaint  Patient presents with  . Follow-up    headaches   HPI: Yolanda Wolfe is a 44 year old female with a history of hyperlipidemia, allergic rhinitis, and depression following today on chronic headaches. She was last seen on 02/05/2020 for her CPE. She has high recurrent headaches, migraines for many years, since childhood. Headache seems to be exacerbated by seasonal changes, stress, and lack of good sleep. She has described headache as sharp pain, bitemporal, frontal, and sometimes occipital. It is most of the time associated with nausea, photophobia, and phonophobia. No associated focal deficit or MS changes.  Currently she is on amitriptyline, which dose was increased from 25 mg to 50 mg on 02/14/20 because she was having headaches almost every day. She has tolerated medication well. She thinks medication is helping some. She is now having problems 0-3 headaches per week about 2 weeks/month. 2-3 headaches are severe, debilitating; rest are mild.  She takes OTC Excedrin Migraine as needed.  Negative for fever, night sweats, abnormal weight loss, visual changes,sore throat, abdominal pain, or vomiting.  She is currently on paroxetine 20 mg daily, she follows with psychiatrist regularly.  ROS: See pertinent positives and negatives per HPI.  Past Medical History:  Diagnosis Date  . Anxiety   . Blood transfusion without reported diagnosis   . Depression   . Frequent headaches   .  GERD (gastroesophageal reflux disease)   . Heart murmur   . Migraine   . UTI (lower urinary tract infection)    Past Surgical History:  Procedure Laterality Date  . ABDOMINAL HYSTERECTOMY  2009  . CESAREAN SECTION      Family History  Problem Relation Age of Onset  . Cancer Maternal Grandmother        breast  . Heart disease Maternal Grandfather   . Alcohol abuse Paternal Grandfather   . Dementia Paternal Grandmother   . Cancer Mother   . Cancer Father     Social History   Socioeconomic History  . Marital status: Married    Spouse name: Not on file  . Number of children: 1  . Years of education: Not on file  . Highest education level: Not on file  Occupational History  . Not on file  Tobacco Use  . Smoking status: Never Smoker  . Smokeless tobacco: Never Used  Vaping Use  . Vaping Use: Never used  Substance and Sexual Activity  . Alcohol use: Yes    Alcohol/week: 1.0 standard drink    Types: 1 Glasses of wine per week  . Drug use: No  . Sexual activity: Yes    Partners: Male    Birth control/protection: Surgical  Other Topics Concern  . Not on file  Social History Narrative  . Not on file   Social Determinants of Health   Financial Resource Strain:   . Difficulty of Paying Living Expenses: Not on file  Food Insecurity:   . Worried About Charity fundraiser in the Last Year: Not on file  . Ran Out of Food in the Last Year: Not  on file  Transportation Needs:   . Lack of Transportation (Medical): Not on file  . Lack of Transportation (Non-Medical): Not on file  Physical Activity:   . Days of Exercise per Week: Not on file  . Minutes of Exercise per Session: Not on file  Stress:   . Feeling of Stress : Not on file  Social Connections:   . Frequency of Communication with Friends and Family: Not on file  . Frequency of Social Gatherings with Friends and Family: Not on file  . Attends Religious Services: Not on file  . Active Member of Clubs or  Organizations: Not on file  . Attends Archivist Meetings: Not on file  . Marital Status: Not on file  Intimate Partner Violence:   . Fear of Current or Ex-Partner: Not on file  . Emotionally Abused: Not on file  . Physically Abused: Not on file  . Sexually Abused: Not on file    Current Outpatient Medications:  .  amitriptyline (ELAVIL) 25 MG tablet, Take 2 tablets (50 mg total) by mouth at bedtime for 20 days., Disp: 40 tablet, Rfl: 0 .  CLARAVIS 40 MG capsule, Take 40 mg by mouth daily., Disp: , Rfl:  .  FLUoxetine (PROZAC) 20 MG capsule, Take 1 capsule (20 mg total) by mouth daily., Disp: 90 capsule, Rfl: 2 .  LORazepam (ATIVAN) 1 MG tablet, lorazepam 1 mg tablet   2 tablets every day by oral route., Disp: , Rfl:  .  topiramate (TOPAMAX) 50 MG tablet, Take 1 tablet (50 mg total) by mouth at bedtime. Start 1/2 tab and increase to 50 mg in 2 weeks if well tolerated., Disp: 30 tablet, Rfl: 1  EXAM:  VITALS per patient if applicable:Ht 5\' 4"  (1.626 m)   LMP  (LMP Unknown)   BMI 29.49 kg/m   GENERAL: alert, oriented, appears well and in no acute distress  HEENT: atraumatic, conjunctiva clear, no obvious abnormalities on inspection.  NECK: normal movements of the head and neck  LUNGS: on inspection no signs of respiratory distress, breathing rate appears normal, no obvious gross SOB, gasping or wheezing  CV: no obvious cyanosis  MS: moves all visible extremities without noticeable abnormality  PSYCH/NEURO: pleasant and cooperative, no obvious depression or anxiety, speech and thought processing grossly intact  ASSESSMENT AND PLAN:  Discussed the following assessment and plan:  Headache, unspecified headache type - Plan: amitriptyline (ELAVIL) 25 MG tablet  No problem-specific Assessment & Plan notes found for this encounter. Problem does not seem to be well controlled. She agrees with weaning off amitriptyline and starting topiramate.   We are going to wean off  Amitriptyline: Decreased dose from 50 mg to 25 mg for 7-10 days then q 2rd days for 7-10 days and then 3rd day for 7-10 day and then stop. Topamax 50 mg 1/2 tab and can be increased to 50 mg while weaning off Amitriptyline. We discussed some side effects of topiramate and the risk of interaction with fluoxetine. We reviewed some symptoms of serotonin syndrome.  Continue taking fluoxetine in the morning and take topiramate at bedtime. She will let me know if any significant side effect. Follow-up in 8 weeks, before if needed.  I discussed the assessment and treatment plan with the patient.  Yolanda Wolfe was provided an opportunity to ask questions and all were answered. She agreed with the plan and demonstrated an understanding of the instructions.   Return in about 7 weeks (around 06/15/2020) for 7-8 weeks headache.Marland Kitchen  Aquilla Shambley [place], MD

## 2020-05-06 ENCOUNTER — Telehealth (INDEPENDENT_AMBULATORY_CARE_PROVIDER_SITE_OTHER): Payer: Commercial Managed Care - PPO | Admitting: Psychiatry

## 2020-05-06 ENCOUNTER — Other Ambulatory Visit: Payer: Self-pay

## 2020-05-06 ENCOUNTER — Encounter: Payer: Self-pay | Admitting: Family Medicine

## 2020-05-06 DIAGNOSIS — F325 Major depressive disorder, single episode, in full remission: Secondary | ICD-10-CM

## 2020-05-06 MED ORDER — LORAZEPAM 1 MG PO TABS
1.0000 mg | ORAL_TABLET | Freq: Two times a day (BID) | ORAL | 4 refills | Status: DC
Start: 2020-05-06 — End: 2020-07-31

## 2020-05-06 MED ORDER — DEPLIN 15 15-90.314 MG PO CAPS: 15.0000 mg | ORAL_CAPSULE | Freq: Every morning | ORAL | 6 refills | Status: DC

## 2020-05-06 MED ORDER — FLUOXETINE HCL 20 MG PO CAPS: 20.0000 mg | ORAL_CAPSULE | Freq: Every day | ORAL | 2 refills | Status: DC

## 2020-05-06 NOTE — Progress Notes (Signed)
Patient ID: Yolanda Wolfe, female   DOB: 08/20/1975, 44 y.o.   MRN: [id number] [facility] MD Progress Note  05/06/2020 4:10 PM Yolanda Wolfe  MRN:  [id number] Subjective: Major depression Principal Problem: Major depression recurrent  Today patient is doing well.  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  She is happily married.  Her child is doing well.  She splits custody with her ex-husband.  Overall the patient's mood is fairly good.  However about once or twice a month she will have a few days where she gets very depressed.  Which she catastrophize is.  She seems to rebound fairly well after about 2 days.  However for the few days that she feels so depressed is very uncomfortable.  This is not persistent and pervasive and therefore is not indicative of a need for an adjustment in her primary antidepressant.  Patient is not in therapy.  She has some very supportive friends.  The patient is healthy.  She owns her home and she loves her home.  She has a new puppy in the last 10 months.  The dog husband and her daughter are all getting along extremely well.  Generally she is quite happy.  She does not member typically in the colder months in the past she has had significant depressive episodes.  It generally been pretty absent.  She takes Ativan 2 mg and it really helps her sleep at night.  She is having some issues with headaches which seem migraine in nature.  Her primary care doctor Dr. Betty Martinique is giving her amitriptyline at low-dose and considering Topamax as well.  Patient is very happy with her primary care doctor.  Generally I think the patient is doing well.  She drinks no alcohol uses no drugs.  Besides her headaches she is relatively healthy. Patient Active Problem List   Diagnosis Date Noted  . Headache, unspecified headache type [R51.9] 12/10/2019  . Varicose veins of both lower extremities with pain [I83.813] 12/10/2019   . Nephrolithiasis [N20.0] 10/31/2017  . Allergic rhinitis [J30.9] 08/16/2017  . Hyperlipidemia, mixed [E78.2] 08/16/2017  . Class 1 obesity with body mass index (BMI) of 30.0 to 30.9 in adult [E66.9, Z68.30] 08/16/2017  . Major depressive disorder, recurrent episode ([place]) [F33.9] 05/07/2015  . Weight gain [R63.5] 06/19/2014  . Depression [F32.A] 06/19/2014  . Routine general medical examination at a health care facility [Z00.00] 06/19/2014  . Insomnia [G47.00] 06/19/2014   Total Time spent with patient: 15 minutes  Past Psychiatric History:   Past Medical History:  Past Medical History:  Diagnosis Date  . Anxiety   . Blood transfusion without reported diagnosis   . Depression   . Frequent headaches   . GERD (gastroesophageal reflux disease)   . Heart murmur   . Migraine   . UTI (lower urinary tract infection)     Past Surgical History:  Procedure Laterality Date  . ABDOMINAL HYSTERECTOMY  2009  . CESAREAN SECTION     Family History:  Family History  Problem Relation Age of Onset  . Cancer Maternal Grandmother        breast  . Heart disease Maternal Grandfather   . Alcohol abuse Paternal Grandfather   . Dementia Paternal Grandmother   . Cancer Mother   . Cancer Father    Family Psychiatric  History:  Social History:  Social History   Substance and Sexual Activity  Alcohol Use Yes  .  Alcohol/week: 1.0 standard drink  . Types: 1 Glasses of wine per week     Social History   Substance and Sexual Activity  Drug Use No    Social History   Socioeconomic History  . Marital status: Married    Spouse name: Not on file  . Number of children: 1  . Years of education: Not on file  . Highest education level: Not on file  Occupational History  . Not on file  Tobacco Use  . Smoking status: Never Smoker  . Smokeless tobacco: Never Used  Vaping Use  . Vaping Use: Never used  Substance and Sexual Activity  . Alcohol use: Yes    Alcohol/week: 1.0 standard drink     Types: 1 Glasses of wine per week  . Drug use: No  . Sexual activity: Yes    Partners: Male    Birth control/protection: Surgical  Other Topics Concern  . Not on file  Social History Narrative  . Not on file   Social Determinants of Health   Financial Resource Strain:   . Difficulty of Paying Living Expenses: Not on file  Food Insecurity:   . Worried About Charity fundraiser in the Last Year: Not on file  . Ran Out of Food in the Last Year: Not on file  Transportation Needs:   . Lack of Transportation (Medical): Not on file  . Lack of Transportation (Non-Medical): Not on file  Physical Activity:   . Days of Exercise per Week: Not on file  . Minutes of Exercise per Session: Not on file  Stress:   . Feeling of Stress : Not on file  Social Connections:   . Frequency of Communication with Friends and Family: Not on file  . Frequency of Social Gatherings with Friends and Family: Not on file  . Attends Religious Services: Not on file  . Active Member of Clubs or Organizations: Not on file  . Attends Archivist Meetings: Not on file  . Marital Status: Not on file   Additional Social History:                         Sleep: Poor  Appetite:  Good  Current Medications: Current Outpatient Medications  Medication Sig Dispense Refill  . amitriptyline (ELAVIL) 25 MG tablet Take 2 tablets (50 mg total) by mouth at bedtime for 20 days. 40 tablet 0  . CLARAVIS 40 MG capsule Take 40 mg by mouth daily.    Marland Kitchen FLUoxetine (PROZAC) 20 MG capsule Take 1 capsule (20 mg total) by mouth daily. 90 capsule 2  . L-Methylfolate-Algae (DEPLIN 15) 15-90.314 MG CAPS Take 15 mg by mouth every morning. 30 capsule 6  . LORazepam (ATIVAN) 1 MG tablet Take 1 tablet (1 mg total) by mouth in the morning and at bedtime. 60 tablet 4  . topiramate (TOPAMAX) 50 MG tablet Take 1 tablet (50 mg total) by mouth at bedtime. Start 1/2 tab and increase to 50 mg in 2 weeks if well tolerated. 30  tablet 1   No current facility-administered medications for this visit.    Lab Results: No results found for this or any previous visit (from the past 48 hour(s)).  Blood Alcohol level:  No results found for: [facility]  Physical Findings: AIMS:  , ,  ,  ,    CIWA:    COWS:     Musculoskeletal: Strength & Muscle Tone: within normal limits Gait & Station  Patient leans: N/A  Psychiatric Specialty Exam: ROS  There were no vitals taken for this visit.There is no height or weight on file to calculate BMI.  General Appearance: Fairly Groomed  Engineer, water::  Good  Speech:  Clear and Coherent  Volume:  Normal  Mood:  Euthymic  Affect:  Appropriate  Thought Process:  Coherent  Orientation:  Full (Time, Place, and Person)  Thought Content:  WDL  Suicidal Thoughts:  No  Homicidal Thoughts:  No  Memory:  NA  Judgement:  Good  Insight:  Fair  Psychomotor Activity:  Normal  Concentration:  Good  Recall:  Good  Fund of Knowledge:Good  Language: Good  Akathisia:  No  Handed:  Right  AIMS (if indicated):     Assets:  Financial Resources/Insurance  ADL's:  Intact  Cognition: WNL  Sleep:      Treatment Plan Summary: At this time the patient will continue taking Prozac 20 mg every day.  Her first problem is that of major depression.  Her second problem is that of insomnia.  Should continue taking Ativan 1 mg 2 at night.  This patient will be reevaluated in 5 months.  She is functioning extremely well. Jerral Ralph, MD 05/06/2020, 4:10 PM Patient ID: Yolanda Wolfe, female   DOB: 1975/12/25, 44 y.o.   MRN: [id number] [facility] MD Progress Note  05/06/2020 4:10 PM Yolanda Wolfe  MRN:  [id number] Subjective:  Spirits are good Principal Problem: Major depression, recurrent episode Diagnosis:  Major depression, recurrent episode + Total Time spent with patient: 15 minutes  Past Psychiatric History:   Past Medical History:  Past Medical History:  Diagnosis Date   . Anxiety   . Blood transfusion without reported diagnosis   . Depression   . Frequent headaches   . GERD (gastroesophageal reflux disease)   . Heart murmur   . Migraine   . UTI (lower urinary tract infection)     Past Surgical History:  Procedure Laterality Date  . ABDOMINAL HYSTERECTOMY  2009  . CESAREAN SECTION     Family History:  Family History  Problem Relation Age of Onset  . Cancer Maternal Grandmother        breast  . Heart disease Maternal Grandfather   . Alcohol abuse Paternal Grandfather   . Dementia Paternal Grandmother   . Cancer Mother   . Cancer Father    Family Psychiatric  History:  Social History:  Social History   Substance and Sexual Activity  Alcohol Use Yes  . Alcohol/week: 1.0 standard drink  . Types: 1 Glasses of wine per week     Social History   Substance and Sexual Activity  Drug Use No    Social History   Socioeconomic History  . Marital status: Married    Spouse name: Not on file  . Number of children: 1  . Years of education: Not on file  . Highest education level: Not on file  Occupational History  . Not on file  Tobacco Use  . Smoking status: Never Smoker  . Smokeless tobacco: Never Used  Vaping Use  . Vaping Use: Never used  Substance and Sexual Activity  . Alcohol use: Yes    Alcohol/week: 1.0 standard drink    Types: 1 Glasses of wine per week  . Drug use: No  . Sexual activity: Yes    Partners: Male    Birth control/protection: Surgical  Other Topics Concern  . Not on file  Social History Narrative  .  Not on file   Social Determinants of Health   Financial Resource Strain:   . Difficulty of Paying Living Expenses: Not on file  Food Insecurity:   . Worried About Charity fundraiser in the Last Year: Not on file  . Ran Out of Food in the Last Year: Not on file  Transportation Needs:   . Lack of Transportation (Medical): Not on file  . Lack of Transportation (Non-Medical): Not on file  Physical Activity:    . Days of Exercise per Week: Not on file  . Minutes of Exercise per Session: Not on file  Stress:   . Feeling of Stress : Not on file  Social Connections:   . Frequency of Communication with Friends and Family: Not on file  . Frequency of Social Gatherings with Friends and Family: Not on file  . Attends Religious Services: Not on file  . Active Member of Clubs or Organizations: Not on file  . Attends Archivist Meetings: Not on file  . Marital Status: Not on file   Additional Social History:                         Sleep: Poor  Appetite:  Good  Current Medications: Current Outpatient Medications  Medication Sig Dispense Refill  . amitriptyline (ELAVIL) 25 MG tablet Take 2 tablets (50 mg total) by mouth at bedtime for 20 days. 40 tablet 0  . CLARAVIS 40 MG capsule Take 40 mg by mouth daily.    Marland Kitchen FLUoxetine (PROZAC) 20 MG capsule Take 1 capsule (20 mg total) by mouth daily. 90 capsule 2  . L-Methylfolate-Algae (DEPLIN 15) 15-90.314 MG CAPS Take 15 mg by mouth every morning. 30 capsule 6  . LORazepam (ATIVAN) 1 MG tablet Take 1 tablet (1 mg total) by mouth in the morning and at bedtime. 60 tablet 4  . topiramate (TOPAMAX) 50 MG tablet Take 1 tablet (50 mg total) by mouth at bedtime. Start 1/2 tab and increase to 50 mg in 2 weeks if well tolerated. 30 tablet 1   No current facility-administered medications for this visit.    Lab Results: No results found for this or any previous visit (from the past 48 hour(s)).  Blood Alcohol level:  No results found for: [facility]  Physical Findings: AIMS:  , ,  ,  ,    CIWA:    COWS:     Musculoskeletal: Strength & Muscle Tone: within normal limits Gait & Station Patient leans: N/A  Psychiatric Specialty Exam: ROS  There were no vitals taken for this visit.There is no height or weight on file to calculate BMI.  General Appearance: Fairly Groomed  Engineer, water::  Good  Speech:  Clear and Coherent  Volume:  Normal   Mood:  Euthymic  Affect:  Appropriate  Thought Process:  Coherent  Orientation:  Full (Time, Place, and Person)  Thought Content:  WDL  Suicidal Thoughts:  No  Homicidal Thoughts:  No  Memory:  NA  Judgement:  Good  Insight:  Fair  Psychomotor Activity:  Normal  Concentration:  Good  Recall:  Good  Fund of Knowledge:Good  Language: Good  Akathisia:  No  Handed:  Right  AIMS (if indicated):     Assets:  Financial Resources/Insurance  ADL's:  Intact  Cognition: WNL  Sleep:      Treatment Plan Summary  This patient's major problem is that of major depression.  She does well taking Prozac  20 mg.  We did not go back to the issues of seeing a therapist.  Apparently given the name of Dr. Rexene Edison but we did talk about whether or not she is seeing her.  At this time I do not believe physical therapy.  I shared with her that it is okay to take low-dose amitriptyline and Topamax is no issue.  Today I will have her add Deplin 15 mg in the morning.  Her second problem is that of insomnia.  She takes 2 mg of Ativan and does very well.  Generally she is sleeping on the Ativan eating well has good energy and is happy with life.  She will return to see me in person in approximately 3 to 4 months.

## 2020-05-17 ENCOUNTER — Telehealth: Payer: Commercial Managed Care - PPO | Admitting: Physician Assistant

## 2020-05-17 DIAGNOSIS — N390 Urinary tract infection, site not specified: Secondary | ICD-10-CM

## 2020-05-17 DIAGNOSIS — R310 Gross hematuria: Secondary | ICD-10-CM

## 2020-05-18 MED ORDER — CEPHALEXIN 500 MG PO CAPS: ORAL_CAPSULE | ORAL | 0 refills | Status: DC

## 2020-05-18 NOTE — Progress Notes (Signed)
Based on what you shared with me it seems your symptoms are worsening.  I have sent in an antibiotic, but if you continue to worsen it would be best to have a face to face visit.  I feel your condition warrants further evaluation and I recommend that you be seen for a face to face office visit.   NOTE: If you entered your credit card information for this eVisit, you will not be charged. You may see a "hold" on your card for the $35 but that hold will drop off and you will not have a charge processed.   If you are having a true medical emergency please call 911.      For an urgent face to face visit, [facility] has five urgent care centers for your convenience:     [facility] [place] Get Driving Directions [phone] [place] [place], [place] [address] . 10 am - 6pm Monday - Friday    [facility]) Get Driving Directions [phone] [address] [place], [place] [address] . 10 am to 8 pm Monday-Friday . 12 pm to 8 pm [facility] Get Driving Directions [phone] [place], [place], [place] [address] . 8 am to 8 pm Monday-Friday . 9 am to 6 pm Saturday . 11 am to 6 pm Sunday     [facility] Get Driving Directions  [phone] [address] [place], [place] [address] . 8 am to 8 pm Monday-Friday . 8 am to 4 pm [facility] [place] Get Driving Directions [phone] [address] [place], [place] [address] . 12 pm to 6 pm Monday-Friday      Your e-visit answers were reviewed by a board certified advanced clinical practitioner to complete your personal care plan.  Thank you for using e-Visits.   Greater than 5 minutes, yet less than 10 minutes of time have been spent researching, coordinating, and implementing care for this  patient today

## 2020-05-18 NOTE — Progress Notes (Signed)

## 2020-06-04 ENCOUNTER — Other Ambulatory Visit: Payer: Self-pay | Admitting: Family Medicine

## 2020-06-10 ENCOUNTER — Ambulatory Visit: Payer: Commercial Managed Care - PPO | Admitting: Family Medicine

## 2020-06-10 ENCOUNTER — Other Ambulatory Visit: Payer: Self-pay

## 2020-06-10 ENCOUNTER — Encounter: Payer: Self-pay | Admitting: Family Medicine

## 2020-06-10 VITALS — BP 128/70 | HR 73 | Resp 16 | Ht 64.0 in | Wt 177.5 lb

## 2020-06-10 DIAGNOSIS — R5382 Chronic fatigue, unspecified: Secondary | ICD-10-CM

## 2020-06-10 DIAGNOSIS — R519 Headache, unspecified: Secondary | ICD-10-CM

## 2020-06-10 DIAGNOSIS — E782 Mixed hyperlipidemia: Secondary | ICD-10-CM

## 2020-06-10 NOTE — Progress Notes (Signed)
HPI: Yolanda Wolfe is a 44 y.o. female, who is here today to follow on recent OV. She was last seen on 04/27/20, when Topamax 50 mg daily was added. She has been on Amitriptyline for a while but was not longer helping.  Headache seems to be getting worse, she is having headache daily. Pain is in dn different area of the head but temporal area most of the time. She has not identified exacerbating or alleviating  A couple time pain is like ice picking sensation,usually shooting pain. Nausea 50% of the time, no vomiting. + Phonophobia and photophobia.  No new stressors. Taking Excedrin migraine almost daily, it does not always help.  Headache lasts from hours to days. No associated visual changes of neurologic focal deficit.  Fatigue: She does not feels rested when she gets up. No known hx of sleep apnea, she has had sleep studies in the past.  Lab Results  Component Value Date   WBC 8.7 10/31/2017   HGB 13.3 10/31/2017   HCT 38.9 10/31/2017   MCV 84.7 10/31/2017   PLT 322.0 10/31/2017   Lab Results  Component Value Date   TSH 1.15 06/19/2014    HLD: She is on non pharmacologic treatment. She had FLP a few months ago at her dermatologist's office, TG in the 300's. She is taking Isotretinoin for acne.  Lab Results  Component Value Date   CHOL 190 02/05/2020   HDL 37 (L) 02/05/2020   LDLCALC 115 (H) 02/05/2020   LDLDIRECT 135.0 08/22/2017   TRIG 265 (H) 02/05/2020   CHOLHDL 5.1 (H) 02/05/2020    Review of Systems  Constitutional: Positive for fatigue. Negative for activity change, appetite change and fever.  HENT: Negative for mouth sores, nosebleeds and sore throat.   Respiratory: Negative for cough, shortness of breath and wheezing.   Cardiovascular: Negative for chest pain, palpitations and leg swelling.  Gastrointestinal: Negative for abdominal pain.       Negative for changes in bowel habits.  Genitourinary: Negative for decreased urine  volume, dysuria and hematuria.  Allergic/Immunologic: Positive for environmental allergies.  Neurological: Negative for syncope and facial asymmetry.  Psychiatric/Behavioral: Negative for confusion. The patient is nervous/anxious.   Rest see pertinent positives and negatives per HPI.  Current Outpatient Medications on File Prior to Visit  Medication Sig Dispense Refill  . cephALEXin (KEFLEX) 500 MG capsule 1 cap po bid x 7 days 14 capsule 0  . CLARAVIS 40 MG capsule Take 40 mg by mouth daily.    Marland Kitchen FLUoxetine (PROZAC) 20 MG capsule Take 1 capsule (20 mg total) by mouth daily. 90 capsule 2  . L-Methylfolate-Algae (DEPLIN 15) 15-90.314 MG CAPS Take 15 mg by mouth every morning. 30 capsule 6  . LORazepam (ATIVAN) 1 MG tablet Take 1 tablet (1 mg total) by mouth in the morning and at bedtime. 60 tablet 4  . amitriptyline (ELAVIL) 25 MG tablet Take 2 tablets (50 mg total) by mouth at bedtime for 20 days. 40 tablet 0   No current facility-administered medications on file prior to visit.     Past Medical History:  Diagnosis Date  . Anxiety   . Blood transfusion without reported diagnosis   . Depression   . Frequent headaches   . GERD (gastroesophageal reflux disease)   . Heart murmur   . Migraine   . UTI (lower urinary tract infection)    No Known Allergies  Social History   Socioeconomic History  . Marital status: Married  Spouse name: Not on file  . Number of children: 1  . Years of education: Not on file  . Highest education level: Not on file  Occupational History  . Not on file  Tobacco Use  . Smoking status: Never Smoker  . Smokeless tobacco: Never Used  Vaping Use  . Vaping Use: Never used  Substance and Sexual Activity  . Alcohol use: Yes    Alcohol/week: 1.0 standard drink    Types: 1 Glasses of wine per week  . Drug use: No  . Sexual activity: Yes    Partners: Male    Birth control/protection: Surgical  Other Topics Concern  . Not on file  Social History  Narrative  . Not on file   Social Determinants of Health   Financial Resource Strain: Not on file  Food Insecurity: Not on file  Transportation Needs: Not on file  Physical Activity: Not on file  Stress: Not on file  Social Connections: Not on file    Vitals:   06/10/20 1519  BP: 128/70  Pulse: 73  Resp: 16  SpO2: 96%   Body mass index is 30.47 kg/m.  Physical Exam Vitals and nursing note reviewed.  Constitutional:      General: She is not in acute distress.    Appearance: She is well-developed.  HENT:     Head: Normocephalic and atraumatic.     Nose:     Right Sinus: No frontal sinus tenderness.     Left Sinus: No frontal sinus tenderness.     Mouth/Throat:     Mouth: Oropharynx is clear and moist and mucous membranes are normal. Mucous membranes are moist.     Pharynx: Oropharynx is clear.  Eyes:     Conjunctiva/sclera: Conjunctivae normal.     Pupils: Pupils are equal, round, and reactive to light.  Cardiovascular:     Rate and Rhythm: Normal rate and regular rhythm.     Heart sounds: No murmur heard.   Pulmonary:     Effort: Pulmonary effort is normal. No respiratory distress.     Breath sounds: Normal breath sounds.  Abdominal:     Palpations: Abdomen is soft. There is no hepatomegaly or mass.     Tenderness: There is no abdominal tenderness.  Musculoskeletal:        General: No edema.     Cervical back: No bony tenderness. No pain with movement. Normal range of motion.       Back:  Lymphadenopathy:     Cervical: No cervical adenopathy.  Skin:    General: Skin is warm.     Findings: No erythema or rash.  Neurological:     General: No focal deficit present.     Mental Status: She is alert and oriented to person, place, and time.     Cranial Nerves: No cranial nerve deficit.     Gait: Gait normal.     Deep Tendon Reflexes: Strength normal.  Psychiatric:        Mood and Affect: Mood and affect normal.     Comments: Well groomed, good eye contact.    ASSESSMENT AND PLAN:  Ms. Jozalynn was seen today for follow-up.  Orders Placed This Encounter  Procedures  . CT Head Wo Contrast  . Lipid panel  . TSH  . CBC  . COMPLETE METABOLIC PANEL WITH GFR   Lab Results  Component Value Date   CHOL 208 (H) 06/11/2020   HDL 38 (L) 06/11/2020   LDLCALC 136 (H)  06/11/2020   LDLDIRECT 135.0 08/22/2017   TRIG 195 (H) 06/11/2020   CHOLHDL 5.5 (H) 06/11/2020   Lab Results  Component Value Date   CREATININE 0.89 06/11/2020   BUN 16 06/11/2020   NA 138 06/11/2020   K 3.9 06/11/2020   CL 104 06/11/2020   CO2 26 06/11/2020   Lab Results  Component Value Date   ALT 11 06/11/2020   AST 11 06/11/2020   BILITOT 0.6 06/11/2020   Lab Results  Component Value Date   WBC 9.6 06/11/2020   HGB 13.2 06/11/2020   HCT 39.4 06/11/2020   MCV 88.5 06/11/2020   PLT 332 06/11/2020   Lab Results  Component Value Date   TSH 1.39 06/11/2020   Headache, unspecified headache type We discussed possible etiologies. Combination of migraine and tension like headache. For now we will stop Topamax, it is not helping. Caution with frequent analgesics,could aggravate problem. We will make recommendations according to lab and imaging results. Instructed about warning signs.  Chronic fatigue We discussed possible etiologies: Systemic illness, immunologic,endocrinology,sleep disorder, psychiatric/psychologic, infectious,medications side effects, and idiopathic. Examination today does not suggest a serious process. Some of her chronic medical problems and meds could aggravate problem.  Further recommendations will be given according to lab results.  Hyperlipidemia, mixed Continue non pharmacologic treatment. HyperTG can be aggravated by Isotretinoin. She is not fasting today, will come back tomorrow for fasting labs.  The 10-year ASCVD risk score Mikey Bussing DC Brooke Bonito., et al., 2013) is: 1.4%   Values used to calculate the score:     Age: 39 years     Sex:  Female     Is Non-Hispanic African American: No     Diabetic: No     Tobacco smoker: No     Systolic Blood Pressure: 226 mmHg     Is BP treated: No     HDL Cholesterol: 38 mg/dL     Total Cholesterol: 208 mg/dL  Return if symptoms worsen or fail to improve, for Depending on lab results..  Lucresha Dismuke G. Martinique, MD  [facility]. [place] office.

## 2020-06-11 ENCOUNTER — Other Ambulatory Visit (INDEPENDENT_AMBULATORY_CARE_PROVIDER_SITE_OTHER): Payer: Commercial Managed Care - PPO

## 2020-06-11 ENCOUNTER — Other Ambulatory Visit: Payer: Self-pay

## 2020-06-11 DIAGNOSIS — E781 Pure hyperglyceridemia: Secondary | ICD-10-CM | POA: Diagnosis not present

## 2020-06-11 DIAGNOSIS — R519 Headache, unspecified: Secondary | ICD-10-CM

## 2020-06-12 LAB — LIPID PANEL
Cholesterol: 208 mg/dL — ABNORMAL HIGH (ref <200)
HDL: 38 mg/dL — ABNORMAL LOW (ref >=50)
LDL Cholesterol (Calc): 136 mg/dL (calc) — ABNORMAL HIGH
Non-HDL Cholesterol (Calc): 170 mg/dL (calc) — ABNORMAL HIGH (ref <130)
Total CHOL/HDL Ratio: 5.5 (calc) — ABNORMAL HIGH (ref <5.0)
Triglycerides: 195 mg/dL — ABNORMAL HIGH (ref <150)

## 2020-06-12 LAB — CBC
HCT: 39.4 % (ref 35.0–45.0)
Hemoglobin: 13.2 g/dL (ref 11.7–15.5)
MCH: 29.7 pg (ref 27.0–33.0)
MCHC: 33.5 g/dL (ref 32.0–36.0)
MCV: 88.5 fL (ref 80.0–100.0)
MPV: 10.9 fL (ref 7.5–12.5)
Platelets: 332 10*3/uL (ref 140–400)
RBC: 4.45 10*6/uL (ref 3.80–5.10)
RDW: 13.2 % (ref 11.0–15.0)
WBC: 9.6 10*3/uL (ref 3.8–10.8)

## 2020-06-12 LAB — COMPLETE METABOLIC PANEL WITH GFR
AG Ratio: 1.5 (calc) (ref 1.0–2.5)
ALT: 11 U/L (ref 6–29)
AST: 11 U/L (ref 10–30)
Albumin: 4.3 g/dL (ref 3.6–5.1)
Alkaline phosphatase (APISO): 84 U/L (ref 31–125)
BUN: 16 mg/dL (ref 7–25)
CO2: 26 mmol/L (ref 20–32)
Calcium: 9.5 mg/dL (ref 8.6–10.2)
Chloride: 104 mmol/L (ref 98–110)
Creat: 0.89 mg/dL (ref 0.50–1.10)
GFR, Est African American: 93 mL/min/{1.73_m2} (ref >=60)
GFR, Est Non African American: 80 mL/min/{1.73_m2} (ref >=60)
Globulin: 2.8 g/dL (calc) (ref 1.9–3.7)
Glucose, Bld: 87 mg/dL (ref 65–99)
Potassium: 3.9 mmol/L (ref 3.5–5.3)
Sodium: 138 mmol/L (ref 135–146)
Total Bilirubin: 0.6 mg/dL (ref 0.2–1.2)
Total Protein: 7.1 g/dL (ref 6.1–8.1)

## 2020-06-12 LAB — TSH: TSH: 1.39 mIU/L

## 2020-06-15 ENCOUNTER — Other Ambulatory Visit: Payer: Self-pay

## 2020-06-15 ENCOUNTER — Ambulatory Visit (INDEPENDENT_AMBULATORY_CARE_PROVIDER_SITE_OTHER)
Admission: RE | Admit: 2020-06-15 | Discharge: 2020-06-15 | Disposition: A | Discharge status: Home / Self Care | Payer: Commercial Managed Care - PPO | Source: Ambulatory Visit | Attending: Family Medicine | Admitting: Family Medicine

## 2020-06-15 DIAGNOSIS — R519 Headache, unspecified: Secondary | ICD-10-CM

## 2020-06-16 ENCOUNTER — Ambulatory Visit: Payer: Commercial Managed Care - PPO | Admitting: Family Medicine

## 2020-06-17 ENCOUNTER — Encounter: Payer: Self-pay | Admitting: Family Medicine

## 2020-06-24 ENCOUNTER — Other Ambulatory Visit: Payer: Self-pay

## 2020-06-24 ENCOUNTER — Encounter: Payer: Self-pay | Admitting: Vascular Surgery

## 2020-06-24 ENCOUNTER — Other Ambulatory Visit: Payer: Self-pay | Admitting: Family Medicine

## 2020-06-24 ENCOUNTER — Ambulatory Visit (INDEPENDENT_AMBULATORY_CARE_PROVIDER_SITE_OTHER): Payer: Commercial Managed Care - PPO | Admitting: Vascular Surgery

## 2020-06-24 VITALS — BP 123/76 | HR 67 | Temp 97.9°F | Resp 16 | Ht 64.0 in | Wt 179.7 lb

## 2020-06-24 DIAGNOSIS — I83813 Varicose veins of bilateral lower extremities with pain: Secondary | ICD-10-CM

## 2020-06-24 DIAGNOSIS — R519 Headache, unspecified: Secondary | ICD-10-CM

## 2020-06-24 NOTE — Progress Notes (Signed)
Patient name: Yolanda Wolfe MRN: [id number] DOB: 1976-05-20 Sex: female  REASON FOR CONSULT: Symptomatic varicose veins with pain.  HPI: Yolanda Wolfe is a 45 y.o. female, with a history of pain in both lower extremities left leg is worse than the right.  She began to have problems after her daughter was born 12 years ago. At that time apparently she had compartment syndrome and fasciotomies.  She does have a mild foot drop on both sides left worse than right.  She developed some fullness heaviness and achiness in her legs as the day progresses.  This usually is improved by the next morning after elevation.  She also developed some warmth and achiness over a cluster of spider type varicosities on the medial aspect of her knee bilaterally.  These also sometimes cause some burning itching and stinging.  She states the compression stockings that she was given in September have helped some but not completely alleviated her symptoms.  She has been compliant wearing these.  She has no history of DVT.  Review of systems: She has no shortness of breath.  She has no chest pain.  Past Medical History:  Diagnosis Date  . Anxiety   . Blood transfusion without reported diagnosis   . Depression   . Frequent headaches   . GERD (gastroesophageal reflux disease)   . Heart murmur   . Migraine   . UTI (lower urinary tract infection)    Past Surgical History:  Procedure Laterality Date  . ABDOMINAL HYSTERECTOMY  2009  . CESAREAN SECTION      Family History  Problem Relation Age of Onset  . Cancer Maternal Grandmother        breast  . Heart disease Maternal Grandfather   . Alcohol abuse Paternal Grandfather   . Dementia Paternal Grandmother   . Cancer Mother   . Cancer Father     SOCIAL HISTORY: Social History   Socioeconomic History  . Marital status: Married    Spouse name: Not on file  . Number of children: 1  . Years of education: Not on file  . Highest education  level: Not on file  Occupational History  . Not on file  Tobacco Use  . Smoking status: Never Smoker  . Smokeless tobacco: Never Used  Vaping Use  . Vaping Use: Never used  Substance and Sexual Activity  . Alcohol use: Yes    Alcohol/week: 1.0 standard drink    Types: 1 Glasses of wine per week  . Drug use: No  . Sexual activity: Yes    Partners: Male    Birth control/protection: Surgical  Other Topics Concern  . Not on file  Social History Narrative  . Not on file   Social Determinants of Health   Financial Resource Strain: Not on file  Food Insecurity: Not on file  Transportation Needs: Not on file  Physical Activity: Not on file  Stress: Not on file  Social Connections: Not on file  Intimate Partner Violence: Not on file    No Known Allergies  Current Outpatient Medications  Medication Sig Dispense Refill  . cephALEXin (KEFLEX) 500 MG capsule 1 cap po bid x 7 days 14 capsule 0  . CLARAVIS 40 MG capsule Take 40 mg by mouth daily.    Marland Kitchen FLUoxetine (PROZAC) 20 MG capsule Take 1 capsule (20 mg total) by mouth daily. 90 capsule 2  . L-Methylfolate-Algae (DEPLIN 15) 15-90.314 MG CAPS Take 15 mg by mouth every morning. 30 capsule  6  . LORazepam (ATIVAN) 1 MG tablet Take 1 tablet (1 mg total) by mouth in the morning and at bedtime. 60 tablet 4   No current facility-administered medications for this visit.    Physical Examination   Vitals:   06/24/20 1325  BP: 123/76  Pulse: 67  Resp: 16  Temp: 97.9 F (36.6 C)  TempSrc: Temporal  SpO2: 98%  Weight: 179 lb 11.2 oz (81.5 kg)  Height: 5\' 4"  (1.626 m)   General:  Alert and oriented, no acute distress Cardiac: Regular Rate and Rhythm Skin: No rash, few scattered spider type varicosities right anterolateral thigh, some in the right medially less prominent.  More prominent clusters of spider varicosities in the left medial knee. Extremity Pulses:  2+ dorsalis pedis pulses bilaterally Musculoskeletal: No deformity or  edema   DATA:  Patient had a duplex exam for reflux performed September 2021.  This showed reflux in the right greater saphenous vein diffusely from the knee to the groin with 4-7 mm diameter.  There was also some right common femoral vein reflux.  There were similar findings in the left leg.  I reviewed and interpreted the study.  I examined both of her legs with the SonoSite at the bedside today.  This again shows a fairly uniform diameter slightly greater than 4 mm diameter greater saphenous vein bilaterally.  ASSESSMENT: Symptomatic varicose veins with warmth and burning in the medial knee over clusters of spider type varicosities and fullness heaviness aching in the calf as the day progresses.  She has evidence of bilateral superficial venous reflux.  Vein diameter 4 to 7 mm bilaterally and her greater saphenous vein.   PLAN: I believe the patient would benefit from bilateral staged laser ablation of her greater saphenous vein for improvement of symptoms.  We would also consider 2 units of sclerotherapy to get rid of the areas in her medial knee and lateral thigh that sometimes cause burning itching and stinging   October 2021, MD [facility] Office: [phone]

## 2020-06-25 ENCOUNTER — Encounter: Payer: Self-pay | Admitting: Neurology

## 2020-06-29 ENCOUNTER — Other Ambulatory Visit: Payer: Self-pay | Admitting: Family Medicine

## 2020-07-09 ENCOUNTER — Encounter: Payer: Self-pay | Admitting: Vascular Surgery

## 2020-07-17 ENCOUNTER — Telehealth: Payer: Self-pay | Admitting: *Deleted

## 2020-07-17 NOTE — Telephone Encounter (Signed)
Left detailed telephone voice message informing Yolanda Wolfe that [facility] had denied authorization for CPT [phone] (bilateral) and [phone] (bilateral) Reference # [id number] because vein diameter size did not meet UMR criteria.  Recommended that if she continue to experience symptoms, that she return in 6 months to see [facility] MD and have repeat venous reflux exam.

## 2020-07-31 ENCOUNTER — Other Ambulatory Visit: Payer: Self-pay

## 2020-07-31 ENCOUNTER — Encounter: Payer: Self-pay | Admitting: Physician Assistant

## 2020-07-31 ENCOUNTER — Ambulatory Visit (INDEPENDENT_AMBULATORY_CARE_PROVIDER_SITE_OTHER): Payer: Commercial Managed Care - PPO | Admitting: Physician Assistant

## 2020-07-31 VITALS — BP 125/80 | HR 74 | Ht 64.0 in | Wt 180.0 lb

## 2020-07-31 DIAGNOSIS — F331 Major depressive disorder, recurrent, moderate: Secondary | ICD-10-CM

## 2020-07-31 DIAGNOSIS — G4700 Insomnia, unspecified: Secondary | ICD-10-CM | POA: Diagnosis not present

## 2020-07-31 DIAGNOSIS — F411 Generalized anxiety disorder: Secondary | ICD-10-CM

## 2020-07-31 MED ORDER — LORAZEPAM 1 MG PO TABS: ORAL_TABLET | ORAL | 1 refills | Status: DC

## 2020-07-31 MED ORDER — MELATONIN 3-10 MG PO TABS: 3.0000 mg | ORAL_TABLET | Freq: Every evening | ORAL | 11 refills | Status: DC | PRN

## 2020-07-31 MED ORDER — FLUOXETINE HCL 40 MG PO CAPS: 40.0000 mg | ORAL_CAPSULE | Freq: Every day | ORAL | 0 refills | Status: DC

## 2020-07-31 NOTE — Progress Notes (Signed)
[facility] MD/PA/NP Initial Note  07/31/2020 12:11 PM Yolanda Wolfe  MRN:  [id number]  Chief Complaint:  Chief Complaint    Establish Care      HPI:   Has seen Dr. Casimiro Needle for a long time, good relationship but wants a fresh look at things.  Has had depression symptoms worsening for the past year or so.  They began on around 12 or 13 years ago after she had her daughter, had to have an emergency hysterectomy after her C-section birth.  That was a bit hard to accept.  Then with hormonal changes.  Has trouble enjoying things.  Prefers to avoid social situations.  Cries sometimes.  Personal hygiene is normal.  Work is fine.  Ruminating thoughts, has always been a big problem.SI a few months ago, 'it would be nice to sleep and be done.' Never had SI in past.  No plan.  No homicidal thoughts.  Sleeps, ok going to sleep but wakes up a lot.  Ativan helps with the sleep some.  She does have generalized anxiety as well.  Has been on Ativan for quite a while.  Some PA.  Saw sleep specialist last year. No OSA. Ativan helps at night. Rarely feels rested. Always had trouble sleeping.  Wakes up w/ headache almost daily. Migraines. H/O h/a since kid. Will be seeing neuro soon. Likes to nap.  Patient denies increased energy with decreased need for sleep, no increased talkativeness, no racing thoughts, no impulsivity or risky behaviors, no increased spending, no increased libido, no grandiosity, no increased irritability or anger, and no hallucinations.   Visit Diagnosis:    ICD-10-CM   1. Major depressive disorder, recurrent episode, moderate (HCC)  F33.1   2. Generalized anxiety disorder  F41.1   3. Insomnia, unspecified type  G47.00     Past Psychiatric History:    No eating d/o. No selfharm. No psych hospitalizations  Past medications for mental health diagnoses include: Amitriptylline for sleep? Celexa, Lunesta, Ativan, Zoloft, Belsomra, Temazapam Topamax for migraines  Depression  hit about 13 years ago, post-partum.   Past Medical History:  Past Medical History:  Diagnosis Date  . Anxiety   . Blood transfusion without reported diagnosis   . Depression   . Frequent headaches   . GERD (gastroesophageal reflux disease)   . Heart murmur   . Migraine   . UTI (lower urinary tract infection)     Past Surgical History:  Procedure Laterality Date  . ABDOMINAL HYSTERECTOMY  2009   Emergency right after C-Section  . CESAREAN SECTION      Family Psychiatric History: see below  Family History:  Family History  Problem Relation Age of Onset  . Cancer Maternal Grandmother        breast  . Heart disease Maternal Grandfather   . Alcohol abuse Paternal Grandfather   . Dementia Paternal Grandmother   . Cancer Mother        neuroendocrine cancer  . Cancer Father        skin  . Healthy Daughter     Social History:  Social History   Socioeconomic History  . Marital status: Married    Spouse name: Not on file  . Number of children: 1  . Years of education: Not on file  . Highest education level: Not on file  Occupational History    Comment: college counselor and admissions counselor  Tobacco Use  . Smoking status: Never Smoker  . Smokeless tobacco: Never Used  Vaping Use  .  Vaping Use: Never used  Substance and Sexual Activity  . Alcohol use: Yes    Alcohol/week: 1.0 standard drink    Types: 1 Glasses of wine per week    Comment: maybe 1-2 monthly  . Drug use: No  . Sexual activity: Yes    Partners: Male    Birth control/protection: Surgical  Other Topics Concern  . 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    Remarried last year, husband is Astronomer.   She and ex husband coparent their almost 28 yo daughter.   She grew up in [place], went to [employer], married 1st husband, traumatic birth see HPI.       Caffeine-no more than 1  per day, and not over 4 days a week.    Legal-bancruptcy when she got divorced.    Religion-Quaker.   Social Determinants of Health   Financial Resource Strain: Low Risk   . Difficulty of Paying Living Expenses: Not hard at all  Food Insecurity: No Food Insecurity  . Worried About Charity fundraiser in the Last Year: Never true  . Ran Out of Food in the Last Year: Never true  Transportation Needs: No Transportation Needs  . Lack of Transportation (Medical): No  . Lack of Transportation (Non-Medical): No  Physical Activity: Inactive  . Days of Exercise per Week: 0 days  . Minutes of Exercise per Session: 0 min  Stress: Stress Concern Present  . Feeling of Stress : Very much  Social Connections: Moderately Isolated  . Frequency of Communication with Friends and Family: Three times a week  . Frequency of Social Gatherings with Friends and Family: Once a week  . Attends Religious Services: 1 to 4 times per year  . Active Member of Clubs or Organizations: No  . Attends Archivist Meetings: Never  . Marital Status: Widowed    Allergies: No Known Allergies  Metabolic Disorder Labs: Lab Results  Component Value Date   HGBA1C 5.2 12/10/2019   No results found for: PROLACTIN Lab Results  Component Value Date   CHOL 208 (H) 06/11/2020   TRIG 195 (H) 06/11/2020   HDL 38 (L) 06/11/2020   CHOLHDL 5.5 (H) 06/11/2020   VLDL 44.0 (H) 08/22/2017   LDLCALC 136 (H) 06/11/2020   LDLCALC 115 (H) 02/05/2020   Lab Results  Component Value Date   TSH 1.39 06/11/2020   TSH 1.15 06/19/2014    Therapeutic Level Labs: No results found for: LITHIUM No results found for: VALPROATE No components found for:  CBMZ  Current Medications: Current Outpatient Medications  Medication Sig Dispense Refill  . CLARAVIS 40 MG capsule Take 40 mg by mouth 2 (two) times daily.    . COLLAGEN PO Take by mouth.    Marland Kitchen FLUoxetine (PROZAC) 40 MG capsule Take 1 capsule (40 mg total) by mouth daily.  90 capsule 0  . L-Methylfolate-Algae (DEPLIN 15) 15-90.314 MG CAPS Take 15 mg by mouth every morning. 30 capsule 6  . Melatonin 3-10 MG TABS Take 3-10 mg by mouth at bedtime as needed. 30 tablet 11  . cephALEXin (KEFLEX) 500 MG capsule 1 cap po bid x 7 days 14 capsule 0  . LORazepam (ATIVAN) 1 MG tablet 1 po qd prn and 2 po qhs. 90 tablet 1   No current facility-administered medications for this visit.    Medication Side Effects: none  Orders placed this visit:  No orders of the defined types were placed in this encounter.   Psychiatric Specialty Exam:  Review of Systems  Constitutional: Positive for fatigue.  HENT: Negative.   Eyes: Negative.   Respiratory: Negative.   Cardiovascular: Negative.   Gastrointestinal: Negative.   Endocrine: Negative.   Genitourinary: Negative.   Musculoskeletal: Negative.   Allergic/Immunologic: Negative.   Neurological: Negative.   Hematological: Negative.   Psychiatric/Behavioral: Positive for dysphoric mood and sleep disturbance. The patient is nervous/anxious.     Blood pressure 125/80, pulse 74, height 5\' 4"  (1.626 m), weight 180 lb (81.6 kg).Body mass index is 30.9 kg/m.  General Appearance: Casual, Neat, Well Groomed and Obese  Eye Contact:  Good  Speech:  Clear and Coherent and Normal Rate  Volume:  Normal  Mood:  Depressed  Affect:  Appropriate  Thought Process:  Goal Directed and Descriptions of Associations: Intact  Orientation:  Full (Time, Place, and Person)  Thought Content: Logical   Suicidal Thoughts:  No  Homicidal Thoughts:  No  Memory:  WNL  Judgement:  Good  Insight:  Good  Psychomotor Activity:  Normal  Concentration:  Concentration: Good  Recall:  Good  Fund of Knowledge: Good  Language: Good  Assets:  Desire for Improvement  ADL's:  Intact  Cognition: WNL  Prognosis:  Good   Screenings:  GAD-7   Flowsheet Row Office Visit from 07/31/2020 in [facility]  Total GAD-7 Score 13    PHQ2-9    [place] Office Visit from 07/31/2020 in [facility]  PHQ-2 Total Score 1  PHQ-9 Total Score 9      Receiving Psychotherapy: No  Legrand Pitts in the past.  Treatment Plan/Recommendations:  PDMP reviewed.  I provided 70 minutes of face-to-face time during this encounter, including time spent before and after the visit reviewing her chart.  Also included was time spent discussing the diagnosis and different treatment options. Recommend increasing the Prozac.  I think the effectiveness has worn off at the current dose.  We will continue Ativan.  Sleep hygiene. Increase Prozac to 40 mg daily. Continue Ativan 1 mg, 1 p.o. daily as needed and 2 p.o. nightly. Continue Deplin 15 mg, 1 p.o. daily. Recommend counseling. Return in 4 to 6 weeks.   Donnal Moat, PA-C

## 2020-08-26 ENCOUNTER — Telehealth (HOSPITAL_COMMUNITY): Payer: Commercial Managed Care - PPO | Admitting: Psychiatry

## 2020-08-31 ENCOUNTER — Encounter: Payer: Self-pay | Admitting: Neurology

## 2020-08-31 ENCOUNTER — Other Ambulatory Visit: Payer: Self-pay

## 2020-08-31 ENCOUNTER — Ambulatory Visit (INDEPENDENT_AMBULATORY_CARE_PROVIDER_SITE_OTHER): Payer: Commercial Managed Care - PPO | Admitting: Neurology

## 2020-08-31 VITALS — BP 138/88 | HR 78 | Resp 18 | Ht 64.0 in | Wt 180.0 lb

## 2020-08-31 DIAGNOSIS — G43709 Chronic migraine without aura, not intractable, without status migrainosus: Secondary | ICD-10-CM

## 2020-08-31 MED ORDER — ONDANSETRON 4 MG PO TBDP: 4.0000 mg | ORAL_TABLET | Freq: Three times a day (TID) | ORAL | 5 refills | Status: DC | PRN

## 2020-08-31 MED ORDER — RIZATRIPTAN BENZOATE 10 MG PO TBDP: ORAL_TABLET | ORAL | 5 refills | Status: DC

## 2020-08-31 MED ORDER — AIMOVIG 140 MG/ML ~~LOC~~ SOAJ: 140.0000 mg | SUBCUTANEOUS | 5 refills | Status: DC

## 2020-08-31 NOTE — Progress Notes (Signed)
Lochlyn Zullo (Key: [id number]) Rx #: [id number] Aimovig 140MG /ML auto-injectors   Form [employer] Electronic Prior Authorization Form (2017 NCPDP) Created 33 minutes ago Sent to Plan 30 minutes ago Plan Response 29 minutes ago Submit Clinical Questions 23 minutes ago Determination Favorable 7 minutes ago Message from Plan Request Reference Number: [id number]. AIMOVIG INJ 140MG /ML is approved through 03/03/2021. Your patient may now fill this prescription and it will be covered.

## 2020-08-31 NOTE — Patient Instructions (Addendum)
  1. Refer to Dr. Kathlen Mody at [facility] 2. Start Aimovig 140mg  every 28 days.  3. Take rizatriptan 10mg  at earliest onset of headache.  May repeat dose once in 2 hours if needed.  Maximum 2 tablets in 24 hours. 4. Take ondansetron for nausea 5. Stop EXCEDRIN.   6. Limit use of pain relievers to no more than 2 days out of the week.  These medications include acetaminophen, NSAIDs (ibuprofen/Advil/Motrin, naproxen/Aleve, triptans (Imitrex/sumatriptan), Excedrin, and narcotics.  This will help reduce risk of rebound headaches. 7. Be aware of common food triggers:  - Caffeine:  coffee, black tea, cola, Mt. Dew  - Chocolate  - Dairy:  aged cheeses (brie, blue, cheddar, gouda, [place], provolone, [place], Swiss, etc), chocolate milk, buttermilk, sour cream, limit eggs and yogurt  - Nuts, peanut butter  - Alcohol  - Cereals/grains:  FRESH breads (fresh bagels, sourdough, doughnuts), yeast productions  - Processed/canned/aged/cured meats (pre-packaged deli meats, hotdogs)  - MSG/glutamate:  soy sauce, flavor enhancer, pickled/preserved/marinated foods  - Sweeteners:  aspartame (Equal, Nutrasweet).  Sugar and Splenda are okay  - Vegetables:  legumes (lima beans, lentils, snow peas, fava beans, pinto peans, peas, garbanzo beans), sauerkraut, onions, olives, pickles  - Fruit:  avocados, bananas, citrus fruit (orange, lemon, grapefruit), mango  - Other:  Frozen meals, macaroni and cheese 8. Routine exercise 9. Stay adequately hydrated (aim for 64 oz water daily) 10. Keep headache diary 11. Maintain proper stress management 12. Maintain proper sleep hygiene 13. Do not skip meals 14. Consider supplements:  magnesium citrate 400mg  daily, riboflavin 400mg  daily, coenzyme Q10 100mg  three times daily.

## 2020-08-31 NOTE — Progress Notes (Signed)
NEUROLOGY CONSULTATION NOTE  Yolanda Wolfe MRN: [id number] DOB: 1975/07/10  Referring provider: Betty Martinique, MD Primary care provider: Betty Martinique, MD  Reason for consult:  headache  Assessment/Plan:   Chronic migraine without aura, without status migrainosus, not intractable  1.  Migraine prevention:  Start Aimovig 140mg  every 28 days 2.  Migraine rescue:  Rizatriptan MLT 10mg .  Zofran ODT 4mg  for nausea.  Stop Excedrin 3.  Limit use of pain relievers to no more than 2 days out of week to prevent risk of rebound or medication-overuse headache. 4.  Keep headache diary 5.  Refer to ophthalmology given increased headaches and CT findings. 6.  Follow up 6 months.   Subjective:  Yolanda Wolfe is a 45 year old right-handed female with depression and anxiety who presents for headaches.  History supplemented by referring provider's note.  Headaches since childhood.  They are severe diffuse but primarily bi-temporal pain with associated nausea, photophobia, phonophobia but no associated vomiting, visual disturbance, autonomic symptoms, numbness or weakness.  They have progressively gotten worse over the past year from every 2 days-every 2 weeks to daily.  She reports increased stress and anxiety.  They last hours to 2 days.  Previously would occur every 2 days to every 2 weeks.  No explainable trigger.  No known relieving factors.  She has been taking Excedrin almost daily.    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  Reports tinnitus in right ear.  Audiometric testing reportedly normal.  Current NSAIDS/analgesics:  Excedrin Current triptans:  none Current ergotamine:  none Current anti-emetic:  none Current muscle relaxants:  none Current Antihypertensive medications:  none Current Antidepressant medications:   Fluoxetine 40mg  daily Current Anticonvulsant medications:  none Current anti-CGRP:  none Current Vitamins/Herbal/Supplements:  Deplin, melatonin Current Antihistamines/Decongestants:  none Other therapy:  none Hormone/birth control:  none Other medications:  Lorazepam 2mg  QHS  Past NSAIDS/analgesics:  Ibuprofen, Tylenol Past abortive triptans:  none Past abortive ergotamine:  none Past muscle relaxants:  none Past anti-emetic:  none Past antihypertensive medications:  none Past antidepressant medications:  Amitriptyline, sertraline Past anticonvulsant medications:  topiramate Past anti-CGRP:  none Past vitamins/Herbal/Supplements:  none Past antihistamines/decongestants:  none Other past therapies:  none  Caffeine:  Sweet tea.  No coffee Diet:  80 oz water daily.  Opto diet.  Does not skip meals.  Optavia Diet. Exercise:  no Depression:  yes; Anxiety:  yes Other pain:  no Sleep:  Poor.  Lorazepam helps her stay asleep.  Wakes up tired.  Sleep apnea ruled out.   Family history of headache:  No      PAST MEDICAL HISTORY: Past Medical History:  Diagnosis Date  . Anxiety   . Blood transfusion without reported diagnosis   . Depression   . Frequent headaches   . GERD (gastroesophageal reflux disease)   . Heart murmur   . Migraine   . UTI (lower urinary tract infection)     PAST SURGICAL HISTORY: Past Surgical History:  Procedure Laterality Date  . ABDOMINAL HYSTERECTOMY  2009   Emergency right after C-Section  . CESAREAN SECTION      MEDICATIONS: Current Outpatient Medications on File Prior to Visit  Medication Sig Dispense Refill  . cephALEXin (KEFLEX) 500 MG capsule 1 cap po bid x 7 days 14 capsule 0  . CLARAVIS 40 MG capsule Take 40  mg by mouth 2 (two) times daily.    . COLLAGEN PO Take by mouth.    Marland Kitchen FLUoxetine (PROZAC) 40 MG capsule Take 1 capsule (40 mg total) by mouth daily. 90 capsule 0  . L-Methylfolate-Algae (DEPLIN 15) 15-90.314 MG CAPS Take 15 mg by  mouth every morning. 30 capsule 6  . LORazepam (ATIVAN) 1 MG tablet 1 po qd prn and 2 po qhs. 90 tablet 1  . Melatonin 3-10 MG TABS Take 3-10 mg by mouth at bedtime as needed. 30 tablet 11   No current facility-administered medications on file prior to visit.    ALLERGIES: No Known Allergies  FAMILY HISTORY: Family History  Problem Relation Age of Onset  . Cancer Maternal Grandmother        breast  . Heart disease Maternal Grandfather   . Alcohol abuse Paternal Grandfather   . Dementia Paternal Grandmother   . Cancer Mother        neuroendocrine cancer  . Cancer Father        skin  . Healthy Daughter     Objective:  Blood pressure 138/88, pulse 78, resp. rate 18, height 5\' 4"  (1.626 m), weight 180 lb (81.6 kg), SpO2 97 %. General: No acute distress.  Patient appears well-groomed.   Head:  Normocephalic/atraumatic Eyes:  fundi examined but not visualized Neck: supple, no paraspinal tenderness, full range of motion Back: No paraspinal tenderness Heart: regular rate and rhythm Lungs: Clear to auscultation bilaterally. Vascular: No carotid bruits. Neurological Exam: Mental status: alert and oriented to person, place, and time, recent and remote memory intact, fund of knowledge intact, attention and concentration intact, speech fluent and not dysarthric, language intact. Cranial nerves: CN I: not tested CN II: pupils equal, round and reactive to light, visual fields intact CN III, IV, VI:  full range of motion, no nystagmus, no ptosis CN V: facial sensation intact. CN VII: upper and lower face symmetric CN VIII: hearing intact CN IX, X: gag intact, uvula midline CN XI: sternocleidomastoid and trapezius muscles intact CN XII: tongue midline Bulk & Tone: normal, no fasciculations. Motor:  muscle strength 5/5 throughout Sensation:  Pinprick, temperature and vibratory sensation intact. Deep Tendon Reflexes:  2+ throughout,  toes downgoing.   Finger to nose testing:  Without  dysmetria.   Heel to shin:  Without dysmetria.   Gait:  Normal station and stride.  Romberg negative.    Thank you for allowing me to take part in the care of this patient.  Metta Clines, DO  CC:  Betty Martinique, MD

## 2020-09-21 ENCOUNTER — Encounter: Payer: Self-pay | Admitting: Physician Assistant

## 2020-09-23 ENCOUNTER — Encounter: Payer: Self-pay | Admitting: Physician Assistant

## 2020-09-23 ENCOUNTER — Other Ambulatory Visit: Payer: Self-pay

## 2020-09-23 ENCOUNTER — Ambulatory Visit (INDEPENDENT_AMBULATORY_CARE_PROVIDER_SITE_OTHER): Payer: Commercial Managed Care - PPO | Admitting: Physician Assistant

## 2020-09-23 DIAGNOSIS — F411 Generalized anxiety disorder: Secondary | ICD-10-CM

## 2020-09-23 DIAGNOSIS — G4700 Insomnia, unspecified: Secondary | ICD-10-CM | POA: Diagnosis not present

## 2020-09-23 DIAGNOSIS — F3341 Major depressive disorder, recurrent, in partial remission: Secondary | ICD-10-CM | POA: Diagnosis not present

## 2020-09-23 MED ORDER — LORAZEPAM 1 MG PO TABS: ORAL_TABLET | ORAL | 2 refills | Status: DC

## 2020-09-23 MED ORDER — FLUOXETINE HCL 20 MG PO CAPS: 60.0000 mg | ORAL_CAPSULE | Freq: Every day | ORAL | 1 refills | Status: DC

## 2020-09-23 NOTE — Progress Notes (Signed)
[facility]  Patient ID: Yolanda Wolfe,  MRN: [id number]  PCP: Martinique, Betty G, MD  Date of Evaluation: 09/23/2020 Time spent:40 minutes  Chief Complaint:  Chief Complaint    Anxiety; Depression; Follow-up      HISTORY/CURRENT STATUS: HPI  For routine med check.  At [place], we increased the Prozac. Is a lot better! At least 50% better.  Can enjoy things more now, energy and motivation can still be low sometimes, but often related to not sleeping well. Not feeling down as much as she did, "This is the way I used to feel!" Sleeps well most of the time. Sometimes has racing thoughts, like last evening she was feeling more overwhelmed to get things ready for company coming this weekend. But Ativan is helping when needed. Anxiety is mostly generalized not PA with physical sx. Still enjoys being alone, likes to read and be with her family. Is an introvert so sometimes energy is decreased b/c having to be around people. No SI/HI.  Had Gene Sight test at [place].  (By mistake I did not document in the last note.)  .Denies dizziness, syncope, seizures, numbness, tingling, tremor, tics, unsteady gait, slurred speech, confusion. Denies muscle or joint pain, stiffness, or dystonia.  Individual Medical History/ Review of Systems: Changes? :No    Past medications for mental health diagnoses include: Amitriptylline for sleep? Celexa, Lunesta, Ativan, Zoloft, Belsomra, Temazapam Topamax for migraines  Allergies: Patient has no known allergies.  Current Medications:  Current Outpatient Medications:  .  CLARAVIS 40 MG capsule, Take 40 mg by mouth 2 (two) times daily., Disp: , Rfl:  .  Erenumab-aooe (AIMOVIG) 140 MG/ML SOAJ, Inject 140 mg into the skin every 28 (twenty-eight) days., Disp: 1.12 mL, Rfl: 5 .  FLUoxetine (PROZAC) 20 MG capsule, Take 3 capsules (60 mg total) by mouth daily. Take one in addition to the 40 mg she already has=60 mg, until the 40 mg pills are gone., Disp:  270 capsule, Rfl: 1 .  FLUoxetine (PROZAC) 40 MG capsule, Take 1 capsule (40 mg total) by mouth daily., Disp: 90 capsule, Rfl: 0 .  Melatonin 3-10 MG TABS, Take 3-10 mg by mouth at bedtime as needed., Disp: 30 tablet, Rfl: 11 .  ondansetron (ZOFRAN ODT) 4 MG disintegrating tablet, Take 1 tablet (4 mg total) by mouth every 8 (eight) hours as needed for nausea or vomiting., Disp: 20 tablet, Rfl: 5 .  rizatriptan (MAXALT-MLT) 10 MG disintegrating tablet, Take 1 tablet earliest onset of migraine.  May repeat in 2 hours if needed.  Maximum 2 tablets in 24 hours., Disp: 9 tablet, Rfl: 5 .  COLLAGEN PO, Take by mouth. (Patient not taking: Reported on 09/23/2020), Disp: , Rfl:  .  L-Methylfolate-Algae (DEPLIN 15) 15-90.314 MG CAPS, Take 15 mg by mouth every morning. (Patient not taking: No sig reported), Disp: 30 capsule, Rfl: 6 .  LORazepam (ATIVAN) 1 MG tablet, 1 po qd prn and 2 po qhs., Disp: 90 tablet, Rfl: 2 Medication Side Effects: none  Family Medical/ Social History: Changes? No  MENTAL HEALTH EXAM:  There were no vitals taken for this visit.There is no height or weight on file to calculate BMI.  General Appearance: Casual and Neat  Eye Contact:  Good  Speech:  Clear and Coherent and Normal Rate  Volume:  Normal  Mood:  Euthymic  Affect:  Appropriate  Thought Process:  Goal Directed and Descriptions of Associations: Intact  Orientation:  Full (Time, Place, and Person)  Thought Content:  Logical   Suicidal Thoughts:  No  Homicidal Thoughts:  No  Memory:  WNL  Judgement:  Good  Insight:  Good  Psychomotor Activity:  Normal  Concentration:  Concentration: Good  Recall:  Good  Fund of Knowledge: Good  Language: Good  Assets:  Desire for Improvement  ADL's:  Intact  Cognition: WNL  Prognosis:  Good   GeneSight test results were gone over with her in full detail.  They are available scanned in her chart. Basically, Prozac and all the other antidepressants including amitriptyline,  sertraline, and citalopram are all in the column "significant gene drug interaction" column.  She has never taken Wellbutrin, Pristiq, or Fetzima which are in the "use as directed" problem.  She has responded very well to the Prozac since we increased the dose so we agreed not to change medication since she is doing well.  Ativan is listed as a moderate gene drug interaction but it is effective for her.  No changes in therapy either.  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   DIAGNOSES:    ICD-10-CM   1. Depression, major, recurrent, in partial remission ([place])  F33.41   2. Generalized anxiety disorder  F41.1   3. Insomnia, unspecified type  G47.00     Receiving Psychotherapy: No    RECOMMENDATIONS:  PDMP was reviewed. I provided 40 minutes of face-to-face time during this encounter, including time spent before and after the visit and records review and charting. We discussed the GeneSight test results in detail.  Since she is responding very well to Prozac, there is no need to change that.  She agrees.  But she is only about 50% better than increasing the dose at the last visit so we agreed to increase even further. Increase Prozac to 60 mg daily. Continue Ativan 1 mg, 1 p.o. daily as needed.  2 p.o. nightly routinely. Return in 2 months.  Donnal Moat, PA-C

## 2020-11-02 ENCOUNTER — Other Ambulatory Visit: Payer: Self-pay | Admitting: Physician Assistant

## 2020-11-03 NOTE — Telephone Encounter (Signed)
Note says she increased to 60 mg daily,should I send that in?or 40 mg take 1 and 1/2 daily?

## 2020-11-04 ENCOUNTER — Other Ambulatory Visit: Payer: Self-pay | Admitting: Physician Assistant

## 2020-11-27 ENCOUNTER — Other Ambulatory Visit: Payer: Self-pay

## 2020-11-27 ENCOUNTER — Ambulatory Visit (INDEPENDENT_AMBULATORY_CARE_PROVIDER_SITE_OTHER): Payer: Commercial Managed Care - PPO | Admitting: Physician Assistant

## 2020-11-27 ENCOUNTER — Encounter: Payer: Self-pay | Admitting: Physician Assistant

## 2020-11-27 DIAGNOSIS — G4700 Insomnia, unspecified: Secondary | ICD-10-CM

## 2020-11-27 DIAGNOSIS — F3341 Major depressive disorder, recurrent, in partial remission: Secondary | ICD-10-CM | POA: Diagnosis not present

## 2020-11-27 DIAGNOSIS — F411 Generalized anxiety disorder: Secondary | ICD-10-CM | POA: Diagnosis not present

## 2020-11-27 MED ORDER — LORAZEPAM 1 MG PO TABS: ORAL_TABLET | ORAL | 2 refills | Status: DC

## 2020-11-27 MED ORDER — FLUOXETINE HCL 20 MG PO CAPS: 60.0000 mg | ORAL_CAPSULE | Freq: Every day | ORAL | 1 refills | Status: DC

## 2020-11-27 NOTE — Progress Notes (Signed)
[facility]  Patient ID: Yolanda Wolfe,  MRN: [id number]  PCP: Martinique, Betty G, MD  Date of Evaluation: 11/27/2020  Time spent:20 minutes  Chief Complaint:  Chief Complaint   Anxiety; Depression; Follow-up      HISTORY/CURRENT STATUS: HPI  For routine med check.  At [place] 2 months ago we increased the Prozac to a total of 60 mg.  She states she is doing really well.  Is able to enjoy things.  Energy and motivation are good.  She is sleeping well most of the time.  Appetite is normal and weight is stable.  No suicidal or homicidal thoughts.  She does still need the Ativan every night for sure.  If she does not take it, she has a very hard time relaxing and cannot go to sleep for racing thoughts.  Needs it during the day as well but not as often as in the evening.  It has really helped anxiety.  Denies dizziness, syncope, seizures, numbness, tingling, tremor, tics, unsteady gait, slurred speech, confusion. Denies muscle or joint pain, stiffness, or dystonia.  Individual Medical History/ Review of Systems: Changes? :No    Past medications for mental health diagnoses include: Amitriptylline for sleep? Celexa, Lunesta, Ativan, Zoloft, Belsomra, Temazapam Topamax for migraines  Allergies: Patient has no known allergies.  Current Medications:  Current Outpatient Medications:    Erenumab-aooe (AIMOVIG) 140 MG/ML SOAJ, Inject 140 mg into the skin every 28 (twenty-eight) days., Disp: 1.12 mL, Rfl: 5   L-Methylfolate-Algae (DEPLIN 15) 15-90.314 MG CAPS, Take 15 mg by mouth every morning., Disp: 30 capsule, Rfl: 6   ondansetron (ZOFRAN ODT) 4 MG disintegrating tablet, Take 1 tablet (4 mg total) by mouth every 8 (eight) hours as needed for nausea or vomiting., Disp: 20 tablet, Rfl: 5   rizatriptan (MAXALT-MLT) 10 MG disintegrating tablet, Take 1 tablet earliest onset of migraine.  May repeat in 2 hours if needed.  Maximum 2 tablets in 24 hours., Disp: 9 tablet, Rfl: 5    CLARAVIS 40 MG capsule, Take 40 mg by mouth 2 (two) times daily. (Patient not taking: Reported on 11/27/2020), Disp: , Rfl:    COLLAGEN PO, Take by mouth. (Patient not taking: Reported on 09/23/2020), Disp: , Rfl:    FLUoxetine (PROZAC) 20 MG capsule, Take 3 capsules (60 mg total) by mouth daily., Disp: 270 capsule, Rfl: 1   LORazepam (ATIVAN) 1 MG tablet, 1 po qd prn and 2 po qhs., Disp: 90 tablet, Rfl: 2   Melatonin 3-10 MG TABS, Take 3-10 mg by mouth at bedtime as needed. (Patient not taking: Reported on 11/27/2020), Disp: 30 tablet, Rfl: 11 Medication Side Effects: none  Family Medical/ Social History: Changes? 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   MENTAL HEALTH EXAM:  There were no vitals taken for this visit.There is no height or weight on file to calculate BMI.  General Appearance: Casual and Neat  Eye Contact:  Good  Speech:  Clear and Coherent and Normal Rate  Volume:  Normal  Mood:  Euthymic  Affect:  Appropriate  Thought Process:  Goal Directed and Descriptions of Associations: Intact  Orientation:  Full (Time, Place, and Person)  Thought Content: Logical   Suicidal Thoughts:  No  Homicidal Thoughts:  No  Memory:  WNL  Judgement:  Good  Insight:  Good  Psychomotor Activity:  Normal  Concentration:  Concentration: Good  Recall:  Good  Fund of Knowledge: Good  Language: Good  Assets:  Desire for Improvement  ADL's:  Intact  Cognition: WNL  Prognosis:  Good   GeneSight test results are in chart  DIAGNOSES:    ICD-10-CM   1. Depression, major, recurrent, in partial remission ([place])  F33.41     2. Generalized anxiety disorder  F41.1     3. Insomnia, unspecified type  G47.00        Receiving Psychotherapy: No    RECOMMENDATIONS:  PDMP was reviewed. I provided 20 minutes of face-to-face time during this encounter, including time spent before and after the visit and records review,  medical decision making, and charting. I am glad to see her doing well!  No changes in meds are necessary at this time. Continue Prozac 20 mg, 3 p.o. daily.   Continue Ativan 1 mg, 1 p.o. daily as needed.  2 p.o. nightly routinely. Return in 3 months.  Donnal Moat, PA-C

## 2021-01-18 DIAGNOSIS — H9312 Tinnitus, left ear: Secondary | ICD-10-CM | POA: Insufficient documentation

## 2021-02-23 ENCOUNTER — Ambulatory Visit (INDEPENDENT_AMBULATORY_CARE_PROVIDER_SITE_OTHER): Payer: Self-pay | Admitting: Physician Assistant

## 2021-02-23 ENCOUNTER — Other Ambulatory Visit: Payer: Self-pay

## 2021-02-23 ENCOUNTER — Encounter: Payer: Self-pay | Admitting: Physician Assistant

## 2021-02-23 DIAGNOSIS — F331 Major depressive disorder, recurrent, moderate: Secondary | ICD-10-CM

## 2021-02-23 DIAGNOSIS — G4700 Insomnia, unspecified: Secondary | ICD-10-CM

## 2021-02-23 DIAGNOSIS — F411 Generalized anxiety disorder: Secondary | ICD-10-CM

## 2021-02-23 DIAGNOSIS — E7212 Methylenetetrahydrofolate reductase deficiency: Secondary | ICD-10-CM

## 2021-02-23 MED ORDER — LORAZEPAM 1 MG PO TABS: ORAL_TABLET | ORAL | 5 refills | Status: DC

## 2021-02-23 MED ORDER — BUPROPION HCL ER (XL) 150 MG PO TB24: 150.0000 mg | ORAL_TABLET | Freq: Every day | ORAL | 1 refills | Status: DC

## 2021-02-23 NOTE — Progress Notes (Signed)
[facility]  Patient ID: Yolanda Wolfe,  MRN: [id number]  PCP: Martinique, Betty G, MD  Date of Evaluation: 02/23/2021  Time spent:30 minutes  Chief Complaint:  Chief Complaint   Depression; Anxiety; Follow-up     HISTORY/CURRENT STATUS: HPI  For routine med check.  Still not having energy or motivation. An ongoing problem. 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  She is at least thinking about it more.  In the past, she did not even think about it she would just sit on the couch and do nothing.  Feels like it is progress to at least be thinking about things.  She pushes herself a lot and always has been very high functioning.  She always does what she has to with work.  She does not cry easily.  She does eat more than she probably should and ask about seeing a nutritionist.  Sleeps fairly well.  No suicidal or homicidal thoughts.  Anxiety is controlled with the Ativan.  She takes it in the evening to help her relax to go to sleep.  Does not usually have panic attacks but more of a generalized sense of unease.  Review of Systems  Constitutional:  Positive for malaise/fatigue.  HENT: Negative.    Eyes: Negative.   Respiratory: Negative.    Cardiovascular: Negative.   Gastrointestinal: Negative.   Genitourinary: Negative.   Musculoskeletal: Negative.   Skin: Negative.   Neurological: Negative.   Endo/Heme/Allergies: Negative.   Psychiatric/Behavioral:  Positive for depression.        See HPI   Individual Medical History/ Review of Systems: Changes? :No    Past medications for mental health diagnoses include: Amitriptylline for sleep? Celexa, Lunesta, Ativan, Zoloft, Belsomra, Temazapam Topamax for migraines  Allergies: Patient has no known allergies.  Current Medications:  Current Outpatient Medications:    buPROPion (WELLBUTRIN XL) 150 MG 24 hr tablet, Take 1 tablet (150 mg total) by mouth  daily., Disp: 30 tablet, Rfl: 1   FLUoxetine (PROZAC) 20 MG capsule, Take 3 capsules (60 mg total) by mouth daily., Disp: 270 capsule, Rfl: 1   ondansetron (ZOFRAN ODT) 4 MG disintegrating tablet, Take 1 tablet (4 mg total) by mouth every 8 (eight) hours as needed for nausea or vomiting., Disp: 20 tablet, Rfl: 5   rizatriptan (MAXALT-MLT) 10 MG disintegrating tablet, Take 1 tablet earliest onset of migraine.  May repeat in 2 hours if needed.  Maximum 2 tablets in 24 hours., Disp: 9 tablet, Rfl: 5   CLARAVIS 40 MG capsule, Take 40 mg by mouth 2 (two) times daily. (Patient not taking: No sig reported), Disp: , Rfl:    COLLAGEN PO, Take by mouth. (Patient not taking: No sig reported), Disp: , Rfl:    Erenumab-aooe (AIMOVIG) 140 MG/ML SOAJ, Inject 140 mg into the skin every 28 (twenty-eight) days. (Patient not taking: Reported on 02/23/2021), Disp: 1.12 mL, Rfl: 5   L-Methylfolate-Algae (DEPLIN 15) 15-90.314 MG CAPS, Take 15 mg by mouth every morning. (Patient not taking: Reported on 02/23/2021), Disp: 30 capsule, Rfl: 6   LORazepam (ATIVAN) 1 MG tablet, 1 po qd prn and 2 po qhs., Disp: 90 tablet, Rfl: 5   Melatonin 3-10 MG TABS, Take 3-10 mg by mouth at bedtime as needed. (Patient not taking: No sig reported), Disp: 30 tablet, Rfl: 11 Medication Side Effects: none  Family Medical/ Social History: Changes?  Teaching at [employer] now  MENTAL HEALTH EXAM:  There were no vitals taken for this visit.There is no height or weight on file to calculate BMI.  General Appearance: Casual, Neat, and Obese  Eye Contact:  Good  Speech:  Clear and Coherent and Normal Rate  Volume:  Normal  Mood:  Euthymic  Affect:  Appropriate  Thought Process:  Goal Directed and Descriptions of Associations: Intact  Orientation:  Full (Time, Place, and Person)  Thought Content: Logical   Suicidal Thoughts:  No  Homicidal Thoughts:  No  Memory:  WNL  Judgement:  Good  Insight:  Good  Psychomotor Activity:  Normal   Concentration:  Concentration: Good  Recall:  Good  Fund of Knowledge: Good  Language: Good  Assets:  Desire for Improvement  ADL's:  Intact  Cognition: WNL  Prognosis:  Good   GeneSight test results are in chart had moderately reduced folic acid conversion  DIAGNOSES:    ICD-10-CM   1. Major depressive disorder, recurrent episode, moderate ([facility])  F33.1     2. 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 Generalized anxiety disorder  F41.1     4. Insomnia, unspecified type  G47.00       Receiving Psychotherapy: No    RECOMMENDATIONS:  PDMP was reviewed.  Last Ativan filled 01/06/2021 I provided 30 minutes of face to face time during this encounter, including time spent before and after the visit in records review, medical decision making, and charting.  I recommend starting Wellbutrin to help with melancholy, also as an appetite suppressant and hopefully some weight loss.  I do recommend that she see a nutritionist.  She will research that before making an appointment with someone. We discussed the benefits, risks, and side effects of Wellbutrin and she would like to try it.  She has never had a seizure or eating disorder. We did not discuss this specifically but she has gone off of [place]in.  I will discuss further at the next visit since she did have moderately reduced folic acid conversion. Start Wellbutrin XL 150 mg, 1 p.o. every morning. Continue Prozac 20 mg, 3 p.o. daily.   Continue Ativan 1 mg, 1 p.o. daily as needed.  2 p.o. nightly routinely. Return in 4 to 6 weeks.  Donnal Moat, PA-C

## 2021-02-25 ENCOUNTER — Telehealth: Payer: Self-pay

## 2021-02-25 NOTE — Telephone Encounter (Signed)
New message    Merlin Caldeira (Key: [id number]) Aimovig '140MG'$ /ML auto-injectors   Form Blue Building control surveyor Form (CB) Created 6 days ago Sent to Plan 26 minutes ago Plan Response 26 minutes ago Submit Clinical Questions 1 minute ago Determination Wait for Determination Please wait for [employer] to return a determination.

## 2021-03-08 NOTE — Telephone Encounter (Signed)
F/U   Paperwork from [employer] [place]  Provider Courtesy review Approval - Aimovig  140 mg   Effective date of authorization 02/25/21 through 05/19/2021

## 2021-03-11 ENCOUNTER — Emergency Department (HOSPITAL_BASED_OUTPATIENT_CLINIC_OR_DEPARTMENT_OTHER): Payer: BC Managed Care – PPO | Admitting: Radiology

## 2021-03-11 ENCOUNTER — Emergency Department (HOSPITAL_BASED_OUTPATIENT_CLINIC_OR_DEPARTMENT_OTHER)
Admission: EM | Admit: 2021-03-11 | Discharge: 2021-03-11 | Disposition: A | Discharge status: Home / Self Care | Payer: BC Managed Care – PPO | Attending: Emergency Medicine | Admitting: Emergency Medicine

## 2021-03-11 ENCOUNTER — Other Ambulatory Visit: Payer: Self-pay

## 2021-03-11 DIAGNOSIS — R0789 Other chest pain: Secondary | ICD-10-CM | POA: Insufficient documentation

## 2021-03-11 LAB — CBC
HCT: 39.7 % (ref 36.0–46.0)
Hemoglobin: 13.3 g/dL (ref 12.0–15.0)
MCH: 29.4 pg (ref 26.0–34.0)
MCHC: 33.5 g/dL (ref 30.0–36.0)
MCV: 87.6 fL (ref 80.0–100.0)
Platelets: 345 10*3/uL (ref 150–400)
RBC: 4.53 MIL/uL (ref 3.87–5.11)
RDW: 13.4 % (ref 11.5–15.5)
WBC: 10.7 10*3/uL — ABNORMAL HIGH (ref 4.0–10.5)
nRBC: 0 % (ref 0.0–0.2)

## 2021-03-11 LAB — TROPONIN I (HIGH SENSITIVITY)
Troponin I (High Sensitivity): <2 ng/L (ref <18)
Troponin I (High Sensitivity): <2 ng/L (ref <18)

## 2021-03-11 LAB — BASIC METABOLIC PANEL
Anion gap: 10 (ref 5–15)
BUN: 17 mg/dL (ref 6–20)
CO2: 25 mmol/L (ref 22–32)
Calcium: 9.5 mg/dL (ref 8.9–10.3)
Chloride: 102 mmol/L (ref 98–111)
Creatinine, Ser: 0.86 mg/dL (ref 0.44–1.00)
GFR, Estimated: >60 mL/min (ref >60)
Glucose, Bld: 114 mg/dL — ABNORMAL HIGH (ref 70–99)
Potassium: 3.8 mmol/L (ref 3.5–5.1)
Sodium: 137 mmol/L (ref 135–145)

## 2021-03-11 LAB — PREGNANCY, URINE: Preg Test, Ur: NEGATIVE

## 2021-03-11 NOTE — ED Triage Notes (Signed)
Patient reports sternal chest pain x2 weeks on and off. Denies radiation, endorses no nause emesis or diarrhea. Denies ShOB.

## 2021-03-11 NOTE — ED Provider Notes (Signed)
[facility] Provider Note   CSN: [id number] Arrival date & time: 03/11/21  1417     History Chief Complaint  Patient presents with   Chest Pain    Yolanda Wolfe is a 45 y.o. female with past medical history significant for anxiety and depression who presents with chest tightness for the last 2 weeks.  Patient reports she did have some test chest tightness that began this afternoon specifically at 1:00.  Patient denies radiation, denies chest pain.  Patient denies shortness of breath, numbness, tingling.  Patient denies nausea, vomiting, diaphoresis.  Patient reports that she does have history of acid reflux, she denies that this feels like an anxiety attack.  Patient reports that she did start a new medication for her anxiety and depression around the same time that she began having some chest tightness.  Patient started taking Wellbutrin.  Patient reports she is still having some chest tightness at time of interview.  Other medical history significant for frequent migraines.   Chest Pain Associated symptoms: no palpitations and no shortness of breath       Past Medical History:  Diagnosis Date   Anxiety    Blood transfusion without reported diagnosis    Depression    Frequent headaches    GERD (gastroesophageal reflux disease)    Heart murmur    Migraine    UTI (lower urinary tract infection)     Patient Active Problem List   Diagnosis Date Noted   Headache, unspecified headache type 12/10/2019   Varicose veins of both lower extremities with pain 12/10/2019   Nephrolithiasis 10/31/2017   Allergic rhinitis 08/16/2017   Hyperlipidemia, mixed 08/16/2017   Class 1 obesity with body mass index (BMI) of 30.0 to 30.9 in adult 08/16/2017   Major depressive disorder, recurrent episode ([place]) 05/07/2015   Weight gain 06/19/2014   Depression 06/19/2014   Routine general medical examination at a health care facility 06/19/2014   Insomnia  06/19/2014    Past Surgical History:  Procedure Laterality Date   ABDOMINAL HYSTERECTOMY  2009   Emergency right after C-Section   CESAREAN SECTION       OB History   No obstetric history on file.     Family History  Problem Relation Age of Onset   Cancer Maternal Grandmother        breast   Heart disease Maternal Grandfather    Alcohol abuse Paternal Grandfather    Dementia Paternal Grandmother    Cancer Mother        neuroendocrine cancer   Cancer Father        skin   Healthy Daughter     Social History   Tobacco Use   Smoking status: Never   Smokeless tobacco: Never  Vaping Use   Vaping Use: Never used  Substance Use Topics   Alcohol use: Yes    Alcohol/week: 1.0 standard drink    Types: 1 Glasses of wine per week    Comment: maybe 1-2 monthly   Drug use: No    Home Medications Prior to Admission medications   Medication Sig Start Date End Date Taking? Authorizing Provider  buPROPion (WELLBUTRIN XL) 150 MG 24 hr tablet Take 1 tablet (150 mg total) by mouth daily. 02/23/21  Yes Hurst, Dorothea Glassman, PA-C  Erenumab-aooe (AIMOVIG) 140 MG/ML SOAJ Inject 140 mg into the skin every 28 (twenty-eight) days. 08/31/20  Yes Jaffe, Adam R, DO  FLUoxetine (PROZAC) 20 MG capsule Take 3 capsules (60 mg  total) by mouth daily. 11/27/20  Yes Donnal Moat T, PA-C  LORazepam (ATIVAN) 1 MG tablet 1 po qd prn and 2 po qhs. 02/23/21  Yes Hurst, Teresa T, PA-C  ondansetron (ZOFRAN ODT) 4 MG disintegrating tablet Take 1 tablet (4 mg total) by mouth every 8 (eight) hours as needed for nausea or vomiting. 08/31/20  Yes Tomi Likens, Adam R, DO  rizatriptan (MAXALT-MLT) 10 MG disintegrating tablet Take 1 tablet earliest onset of migraine.  May repeat in 2 hours if needed.  Maximum 2 tablets in 24 hours. 08/31/20  Yes Jaffe, Adam R, DO  CLARAVIS 40 MG capsule Take 40 mg by mouth 2 (two) times daily. Patient not taking: No sig reported 03/17/20   [provider]  COLLAGEN PO Take by mouth. Patient  not taking: No sig reported    [provider]  L-Methylfolate-Algae (DEPLIN 15) 15-90.314 MG CAPS Take 15 mg by mouth every morning. Patient not taking: Reported on 02/23/2021 05/06/20   Norma Fredrickson, MD  Melatonin 3-10 MG TABS Take 3-10 mg by mouth at bedtime as needed. Patient not taking: No sig reported 07/31/20   Donnal Moat T, PA-C    Allergies    Patient has no known allergies.  Review of Systems   Review of Systems  Respiratory:  Positive for chest tightness. Negative for shortness of breath.   Cardiovascular:  Negative for chest pain and palpitations.  All other systems reviewed and are negative.  Physical Exam Updated Vital Signs BP 111/71 (BP Location: Right Arm)   Pulse 66   Temp 97.6 F (36.4 C)   Resp (!) 22   Ht 5\' 4"  (1.626 m)   Wt 79.4 kg   LMP  (LMP Unknown)   SpO2 97%   BMI 30.04 kg/m   Physical Exam Vitals and nursing note reviewed.  Constitutional:      General: She is not in acute distress.    Appearance: Normal appearance.  HENT:     Head: Normocephalic and atraumatic.  Eyes:     General:        Right eye: No discharge.        Left eye: No discharge.  Cardiovascular:     Rate and Rhythm: Normal rate and regular rhythm.     Heart sounds: No murmur heard.   No friction rub. No gallop.  Pulmonary:     Effort: Pulmonary effort is normal.     Breath sounds: Normal breath sounds.  Abdominal:     General: Bowel sounds are normal.     Palpations: Abdomen is soft.  Skin:    General: Skin is warm and dry.     Capillary Refill: Capillary refill takes less than 2 seconds.  Neurological:     Mental Status: She is alert and oriented to person, place, and time.  Psychiatric:        Mood and Affect: Mood normal.        Behavior: Behavior normal.    ED Results / Procedures / Treatments   Labs (all labs ordered are listed, but only abnormal results are displayed) Labs Reviewed  BASIC METABOLIC PANEL - Abnormal; Notable for the following  components:      Result Value   Glucose, Bld 114 (*)    All other components within normal limits  CBC - Abnormal; Notable for the following components:   WBC 10.7 (*)    All other components within normal limits  PREGNANCY, URINE  TROPONIN I (HIGH SENSITIVITY)  TROPONIN  I (HIGH SENSITIVITY)    EKG EKG Interpretation  Date/Time:  Thursday March 11 2021 14:25:41 EDT Ventricular Rate:  90 PR Interval:  134 QRS Duration: 72 QT Interval:  374 QTC Calculation: 457 R Axis:   34 Text Interpretation: Sinus rhythm with occasional Premature ventricular complexes Cannot rule out Anterior infarct , age undetermined Abnormal ECG No old tracing to compare Confirmed by Aletta Edouard [phone]) on 03/11/2021 3:41:44 PM  Radiology DG Chest 2 View  Result Date: 03/11/2021 CLINICAL DATA:  Chest pain. EXAM: CHEST - 2 VIEW COMPARISON:  None. FINDINGS: The heart size and mediastinal contours are within normal limits. Both lungs are clear. The visualized skeletal structures are unremarkable. IMPRESSION: No active cardiopulmonary disease. Electronically Signed   By: Ronney Asters M.D.   On: 03/11/2021 15:53    Procedures Procedures   Medications Ordered in ED Medications - No data to display  ED Course  I have reviewed the triage vital signs and the nursing notes.  Pertinent labs & imaging results that were available during my care of the patient were reviewed by me and considered in my medical decision making (see chart for details).    MDM Rules/Calculators/A&P                         Given the large differential diagnosis for Yolanda Wolfe, the decision making in this case is of high complexity.  After evaluating all of the data points in this case, the presentation of Yolanda Wolfe is NOT consistent with Acute Coronary Syndrome (ACS) and/or myocardial ischemia, pulmonary embolism, aortic dissection; Borhaave's, significant arrythmia, pneumothorax, cardiac tamponade,  or other emergent cardiopulmonary condition.  Further, the presentation of Yolanda Wolfe is NOT consistent with pericarditis, myocarditis, cholecystitis, pancreatitis, mediastinitis, endocarditis, new valvular disease.  Additionally, the presentation of Yolanda Wolfe NOT consistent with flail chest, cardiac contusion, ARDS, or significant intra-thoracic or intra-abdominal bleeding.  Moreover, this presentation is NOT consistent with pneumonia, sepsis, or pyelonephritis.  The patient had a recent medication change in which she started taking Wellbutrin around the same time that she began having these episodes of chest tightness.  I recommend that she follow-up with her psychiatrist, her primary care doctor to discuss discontinuing this medication.  I am reassured the patient is not having a cardiac or pulmonary cause of her chest pain today, she has negative delta troponins, and a story that is not consistent with a cardiac cause to her chest pain.  Low clinical suspicion. She did have some questionable anterior changes in her EKG, however her EKG does not appear overwhelmingly ischemic, and does not agree with the rest of her clinical picture.  Strict return and follow-up precautions have been given by me personally or by detailed written instruction given verbally by nursing staff using the teach back method to the patient/family/caregiver(s).  Data Reviewed/Counseling: I have reviewed the patient's vital signs, nursing notes, and other relevant tests/information. I had a detailed discussion regarding the historical points, exam findings, and any diagnostic results supporting the discharge diagnosis. I also discussed the need for outpatient follow-up and the need to return to the ED if symptoms worsen or if there are any questions or concerns that arise at home.  Final Clinical Impression(s) / ED Diagnoses Final diagnoses:  Chest tightness    Rx / DC Orders ED Discharge  Orders     None        Anselmo Pickler, PA-C 03/11/21 1930  Hayden Rasmussen, MD 03/12/21 1011

## 2021-03-11 NOTE — Discharge Instructions (Signed)
As we discussed please follow-up with your primary care doctor and psychiatrist to discuss medication changes.  Please return if you have worsening chest pain, shortness of breath.  It was a pleasure taking care of you today.

## 2021-03-12 NOTE — Progress Notes (Signed)
Virtual Visit via Video Note The purpose of this virtual visit is to provide medical care while limiting exposure to the novel coronavirus.    Consent was obtained for video visit:  Yes.   Answered questions that patient had about telehealth interaction:  Yes.   I discussed the limitations, risks, security and privacy concerns of performing an evaluation and management service by telemedicine. I also discussed with the patient that there may be a patient responsible charge related to this service. The patient expressed understanding and agreed to proceed.  Pt location: Home Physician Location: office Name of referring provider:  Martinique, Betty G, MD I connected with [name] at patients initiation/request on 03/15/2021 at  1:30 PM EDT by video enabled telemedicine application and verified that I am speaking with the correct person using two identifiers. Pt MRN:  [id number] Pt DOB:  March 19, 1976 Video Participants:  [name]  Assessment and Plan:   Migraine without aura, without status migrainosus, not intractable  Migraine prevention:  It appears that she is approved for Aimovig 140mg  from September until November.  Will contact her pharmacy.  Otherwise, will try Emgality or Ajovy Migraine rescue:  Stop Maxalt.  Start sumatriptan 100mg .  If ineffective, would start Ubrelvy or Nurtec.  Zofran for nausea Limit use of pain relievers to no more than 2 days out of week to prevent risk of rebound or medication-overuse headache. Keep headache diary Follow up 6 months.   History of Present Illness:  Yolanda Wolfe is a 45 year old right-handed female with depression and anxiety who follows up for migraines.  UPDATE: Started Aimovig in March.  Intensity:  severe Duration:  several hours with Maxalt Frequency:  June - 11 days, July - 8 days, August - 6 days, September (so far) - 7 days However, she has not taken this month's dose because insurance changed  and it was $700.  Last injection beginning of August. Frequency of abortive medication: 7 days this month. Current NSAIDS/analgesics:  Excedrin Current triptans:  Maxalt-MLT 10mg  Current ergotamine:  none Current anti-emetic:  Zofran ODT 4mg  Current muscle relaxants:  none Current Antihypertensive medications:  none Current Antidepressant medications:  Fluoxetine 40mg  daily Current Anticonvulsant medications:  none Current anti-CGRP:  Aimovig 140mg  Q28d Current Vitamins/Herbal/Supplements:  Deplin, melatonin Current Antihistamines/Decongestants:  none Other therapy:  none Hormone/birth control:  none Other medications:  Lorazepam 2mg  QHS  Caffeine:  Sweet tea.  No coffee Diet:  80 oz water daily.  Opto diet.  Does not skip meals.  Optavia Diet. Exercise:  no Depression:  yes; Anxiety:  yes Other pain:  no Sleep:  Poor.  Lorazepam helps her stay asleep.  Wakes up tired.  Sleep apnea ruled out.   HISTORY:  Headaches since childhood.  They are severe diffuse but primarily bi-temporal pain with associated nausea, photophobia, phonophobia but no associated vomiting, visual disturbance, autonomic symptoms, numbness or weakness.  They have progressively gotten worse over the past year from every 2 days-every 2 weeks to daily.  She reports increased stress and anxiety.  They last hours to 2 days.  Previously would occur every 2 days to every 2 weeks.  No explainable trigger.  No known relieving factors.  She has been taking Excedrin almost daily.     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   Reports tinnitus in right ear.  Audiometric testing reportedly normal.    Past NSAIDS/analgesics:  Ibuprofen, Tylenol Past abortive triptans:  none Past abortive ergotamine:  none Past muscle relaxants:  none Past anti-emetic:  none Past  antihypertensive medications:  none Past antidepressant medications:  Amitriptyline, sertraline Past anticonvulsant medications:  topiramate Past anti-CGRP:  none Past vitamins/Herbal/Supplements:  none Past antihistamines/decongestants:  none Other past therapies:  none     Family history of headache:  No  Past Medical History: Past Medical History:  Diagnosis Date   Anxiety    Blood transfusion without reported diagnosis    Depression    Frequent headaches    GERD (gastroesophageal reflux disease)    Heart murmur    Migraine    UTI (lower urinary tract infection)     Medications: Outpatient Encounter Medications as of 03/15/2021  Medication Sig   buPROPion (WELLBUTRIN XL) 150 MG 24 hr tablet Take 1 tablet (150 mg total) by mouth daily.   CLARAVIS 40 MG capsule Take 40 mg by mouth 2 (two) times daily. (Patient not taking: No sig reported)   COLLAGEN PO Take by mouth. (Patient not taking: No sig reported)   Erenumab-aooe (AIMOVIG) 140 MG/ML SOAJ Inject 140 mg into the skin every 28 (twenty-eight) days.   FLUoxetine (PROZAC) 20 MG capsule Take 3 capsules (60 mg total) by mouth daily.   L-Methylfolate-Algae (DEPLIN 15) 15-90.314 MG CAPS Take 15 mg by mouth every morning. (Patient not taking: Reported on 02/23/2021)   LORazepam (ATIVAN) 1 MG tablet 1 po qd prn and 2 po qhs.   Melatonin 3-10 MG TABS Take 3-10 mg by mouth at bedtime as needed. (Patient not taking: No sig reported)   ondansetron (ZOFRAN ODT) 4 MG disintegrating tablet Take 1 tablet (4 mg total) by mouth every 8 (eight) hours as needed for nausea or vomiting.   rizatriptan (MAXALT-MLT) 10 MG disintegrating tablet Take 1 tablet earliest onset of migraine.  May repeat in 2 hours if needed.  Maximum 2 tablets in 24 hours.   No facility-administered encounter medications on file as of 03/15/2021.    Allergies: No Known Allergies  Family History: Family History  Problem Relation Age of Onset   Cancer Maternal  Grandmother        breast   Heart disease Maternal Grandfather    Alcohol abuse Paternal Grandfather    Dementia Paternal Grandmother    Cancer Mother        neuroendocrine cancer   Cancer Father        skin   Healthy Daughter     Observations/Objective:   There were no vitals taken for this visit. No acute distress.  Alert and oriented.  Speech fluent and not dysarthric.  Language intact.     Follow Up Instructions:    -I discussed the assessment and treatment plan with the patient. The patient was provided an opportunity to ask questions and all were answered. The patient agreed with the plan and demonstrated an understanding of the instructions.   The patient was advised to call back or seek an in-person evaluation if the symptoms worsen or if the condition fails to improve as anticipated.    Dudley Major, DO

## 2021-03-15 ENCOUNTER — Other Ambulatory Visit: Payer: Self-pay

## 2021-03-15 ENCOUNTER — Telehealth: Payer: BC Managed Care – PPO | Admitting: Neurology

## 2021-03-15 ENCOUNTER — Encounter: Payer: Self-pay | Admitting: Neurology

## 2021-03-15 DIAGNOSIS — G43009 Migraine without aura, not intractable, without status migrainosus: Secondary | ICD-10-CM

## 2021-03-15 MED ORDER — SUMATRIPTAN SUCCINATE 100 MG PO TABS: ORAL_TABLET | ORAL | 5 refills | Status: DC

## 2021-03-15 NOTE — Patient Instructions (Signed)
  Will try to restart Aimovig.  Otherwise, ask insurance about Emgality or Ajovy.  Also ask about Roselyn Meier and Nurtec Stop rizatriptan.  Take sumatriptan 100mg  at earliest onset of headache.  May repeat dose once in 2 hours if needed.  Maximum 2 tablets in 24 hours. Zofran for nausea Limit use of pain relievers to no more than 2 days out of the week.  These medications include acetaminophen, NSAIDs (ibuprofen/Advil/Motrin, naproxen/Aleve, triptans (Imitrex/sumatriptan), Excedrin, and narcotics.  This will help reduce risk of rebound headaches. Be aware of common food triggers:  - Caffeine:  coffee, black tea, cola, Mt. Dew  - Chocolate  - Dairy:  aged cheeses (brie, blue, cheddar, gouda, [place], provolone, [place], Swiss, etc), chocolate milk, buttermilk, sour cream, limit eggs and yogurt  - Nuts, peanut butter  - Alcohol  - Cereals/grains:  FRESH breads (fresh bagels, sourdough, doughnuts), yeast productions  - Processed/canned/aged/cured meats (pre-packaged deli meats, hotdogs)  - MSG/glutamate:  soy sauce, flavor enhancer, pickled/preserved/marinated foods  - Sweeteners:  aspartame (Equal, Nutrasweet).  Sugar and Splenda are okay  - Vegetables:  legumes (lima beans, lentils, snow peas, fava beans, pinto peans, peas, garbanzo beans), sauerkraut, onions, olives, pickles  - Fruit:  avocados, bananas, citrus fruit (orange, lemon, grapefruit), mango  - Other:  Frozen meals, macaroni and cheese Routine exercise Stay adequately hydrated (aim for 64 oz water daily) Keep headache diary Maintain proper stress management Maintain proper sleep hygiene Do not skip meals Consider supplements:  magnesium citrate 400mg  daily, riboflavin 400mg  daily, coenzyme Q10 100mg  three times daily.

## 2021-03-16 ENCOUNTER — Ambulatory Visit: Payer: Self-pay | Admitting: Family Medicine

## 2021-03-16 NOTE — Telephone Encounter (Signed)
New message    Resubmit prior authorization .    [name] ([place]) Rx #: [id number] Aimovig 140MG /ML auto-injectors   Form Blue Building control surveyor Form (CB) Created 20 days ago Sent to Plan 7 minutes ago Plan Response 7 minutes ago Submit Clinical Questions Determination Clinical Questions Are Ready Fill out the questions below and click "Send to Plan." The plan requires answers to the clinical questions for this electronic prior authorization.

## 2021-03-18 ENCOUNTER — Other Ambulatory Visit: Payer: Self-pay | Admitting: Physician Assistant

## 2021-03-19 NOTE — Telephone Encounter (Signed)
90 day ok?

## 2021-03-23 ENCOUNTER — Telehealth: Payer: Self-pay | Admitting: Neurology

## 2021-03-23 NOTE — Telephone Encounter (Signed)
Pt called in stating she found out why her Aimovig was not being filled. It's covered by her insurance, but [employer] is waiting on the doctor to approve it.

## 2021-03-24 NOTE — Telephone Encounter (Signed)
F/u   March 08, 2021  GD   11:30 AM Note F/U    Paperwork from [place] [facility] [place]   Provider Courtesy review Approval - Aimovig  140 mg    Effective date of authorization 02/25/21 through 05/19/2021

## 2021-03-24 NOTE — Telephone Encounter (Signed)
F/U   ENNIFER Vandergrift (Key: [id number]) Aimovig 140MG /ML auto-injectors   Form Blue Building control surveyor Form (CB) Created 5 minutes ago Sent to Plan 5 minutes ago Plan Response 5 minutes ago Submit Clinical Questions 1 minute ago Determination Wait for Determination Please wait for [employer] to return a determination.

## 2021-03-29 ENCOUNTER — Other Ambulatory Visit: Payer: Self-pay | Admitting: Obstetrics and Gynecology

## 2021-03-29 DIAGNOSIS — N6324 Unspecified lump in the left breast, lower inner quadrant: Secondary | ICD-10-CM

## 2021-03-29 DIAGNOSIS — N6320 Unspecified lump in the left breast, unspecified quadrant: Secondary | ICD-10-CM | POA: Diagnosis not present

## 2021-04-02 ENCOUNTER — Other Ambulatory Visit: Payer: Self-pay | Admitting: Obstetrics and Gynecology

## 2021-04-02 ENCOUNTER — Ambulatory Visit
Admission: RE | Admit: 2021-04-02 | Discharge: 2021-04-02 | Disposition: A | Discharge status: Home / Self Care | Payer: BC Managed Care – PPO | Source: Ambulatory Visit | Attending: Obstetrics and Gynecology | Admitting: Obstetrics and Gynecology

## 2021-04-02 ENCOUNTER — Other Ambulatory Visit: Payer: Self-pay

## 2021-04-02 DIAGNOSIS — N6324 Unspecified lump in the left breast, lower inner quadrant: Secondary | ICD-10-CM

## 2021-04-02 DIAGNOSIS — R922 Inconclusive mammogram: Secondary | ICD-10-CM | POA: Diagnosis not present

## 2021-04-02 DIAGNOSIS — N6489 Other specified disorders of breast: Secondary | ICD-10-CM | POA: Diagnosis not present

## 2021-04-07 ENCOUNTER — Other Ambulatory Visit: Payer: BC Managed Care – PPO

## 2021-04-07 ENCOUNTER — Ambulatory Visit: Payer: Self-pay | Admitting: Physician Assistant

## 2021-04-08 ENCOUNTER — Ambulatory Visit: Payer: Self-pay | Admitting: Physician Assistant

## 2021-04-22 ENCOUNTER — Other Ambulatory Visit: Payer: Self-pay | Admitting: Neurology

## 2021-04-23 ENCOUNTER — Telehealth: Payer: Self-pay

## 2021-04-23 NOTE — Telephone Encounter (Signed)
New message   Your information has been submitted to [place]. [employer] [place] will review the request and notify you of the determination decision directly, typically within 72 hours of receiving all information.  You will also receive your request decision electronically. To check for an update later, open this request again from your dashboard.  If [employer] [place] has not responded within the specified timeframe or if you have any questions about your PA submission, contact [place] directly at [phone].  Keren Duford (Key: [id number]) Aimovig 140MG /ML auto-injectors   Form Blue Building control surveyor Form (CB) Created 5 minutes ago Sent to Plan 2 minutes ago Plan Response 2 minutes ago Submit Clinical Questions less than a minute ago Determination Wait for Determination Please wait for [employer] to return a determination.

## 2021-04-23 NOTE — Telephone Encounter (Signed)
F/u   Yolanda Wolfe (Key: [id number]) Aimovig 140MG /ML auto-injectors   Form Blue Building control surveyor Form (CB) Created 5 minutes ago Sent to Plan 2 minutes ago Plan Response 2 minutes ago Submit Clinical Questions less than a minute ago Determination Wait for Determination Please wait for [employer] to return a determination.

## 2021-04-26 NOTE — Telephone Encounter (Signed)
F/u   Received fax from [facility] [place] provider courtesy review approval   Medication Aimovig 140 mg   Effective dates of authorization 02/25/21 to 05/19/21

## 2021-05-04 ENCOUNTER — Other Ambulatory Visit: Payer: Self-pay | Admitting: Physician Assistant

## 2021-05-04 ENCOUNTER — Ambulatory Visit: Payer: Self-pay | Admitting: Physician Assistant

## 2021-05-04 NOTE — Telephone Encounter (Signed)
Patient has an appt with Helene Kelp tomorrow, 05/05/21. Will wait to see if there are any changes before sending refill.

## 2021-05-05 ENCOUNTER — Other Ambulatory Visit: Payer: Self-pay

## 2021-05-05 ENCOUNTER — Encounter: Payer: Self-pay | Admitting: Physician Assistant

## 2021-05-05 ENCOUNTER — Ambulatory Visit (INDEPENDENT_AMBULATORY_CARE_PROVIDER_SITE_OTHER): Payer: BC Managed Care – PPO | Admitting: Physician Assistant

## 2021-05-05 DIAGNOSIS — F411 Generalized anxiety disorder: Secondary | ICD-10-CM

## 2021-05-05 DIAGNOSIS — F3341 Major depressive disorder, recurrent, in partial remission: Secondary | ICD-10-CM | POA: Diagnosis not present

## 2021-05-05 DIAGNOSIS — G4700 Insomnia, unspecified: Secondary | ICD-10-CM | POA: Diagnosis not present

## 2021-05-05 MED ORDER — FLUOXETINE HCL 20 MG PO CAPS: 60.0000 mg | ORAL_CAPSULE | Freq: Every day | ORAL | 1 refills | Status: DC

## 2021-05-05 NOTE — Progress Notes (Signed)
[facility]  Patient ID: Yolanda Wolfe,  MRN: [id number]  PCP: Martinique, Betty G, MD  Date of Evaluation: 05/05/2021  Time spent:40 minutes  Chief Complaint:  Chief Complaint   Anxiety; Depression; Follow-up      HISTORY/CURRENT STATUS: HPI  For routine med check.  Since going on the Wellbutrin 6 weeks ago she has felt much better.  Until about 2 weeks ago when her mood decreased a bit again.  She is under a lot of stress right now though, her husband had been having problems with his job and then unexpectedly laid off last week.  She is not sure if those triggers affected her or what.  Does continue to have palpitations at times but is reassured that cardiac work-up was negative and she wants to stay on the Wellbutrin because it did help initially.    She is able to enjoy some things.  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  She and several family members are going to [place] leaving Friday and she kind of dreads the trip, only because her husband will not be able to go.  She has traveled abroad several times so its not that she is concerned about that or the flight or anything else.  She just dreads him not being there.  She did miss work 1 day because of the way she was feeling mentally since our last appointment.  Otherwise not missing any work, not isolating.  She sleeps about the same, sometimes wakes up during the night but is able to go back to sleep.  This has not worsened since starting the Wellbutrin.  No suicidal or homicidal thoughts.  Does get anxious.  Ativan is helpful.  Not having panic attacks so much is just a generalized sense of unease, dreading things that she should not necessarily dread.  Patient denies increased energy with decreased need for sleep, no increased talkativeness, no racing thoughts, no impulsivity or risky behaviors, no increased spending, no increased libido, no grandiosity, no increased  irritability or anger, and no hallucinations.  Review of Systems  Constitutional: Negative.   HENT: Negative.    Eyes: Negative.   Respiratory: Negative.    Cardiovascular:  Positive for palpitations.       See ER notes from 03/11/2021.  Gastrointestinal: Negative.   Genitourinary: Negative.   Musculoskeletal: Negative.   Skin: Negative.   Neurological: Negative.   Endo/Heme/Allergies: Negative.   Psychiatric/Behavioral:         See HPI.    Individual Medical History/ Review of Systems: Changes? :Yes   had to go to ER for chest tightness 03/11/2021, see ER notes.  Past medications for mental health diagnoses include: Amitriptylline for sleep? Celexa, Lunesta, Ativan, Zoloft, Belsomra, Temazapam Topamax for migraines  Allergies: Patient has no known allergies.  Current Medications:  Current Outpatient Medications:    buPROPion (WELLBUTRIN XL) 150 MG 24 hr tablet, TAKE 1 TABLET BY MOUTH EVERY DAY, Disp: 90 tablet, Rfl: 0   Erenumab-aooe (AIMOVIG) 140 MG/ML SOAJ, Inject 140 mg into the skin every 28 (twenty-eight) days., Disp: 1.12 mL, Rfl: 5   FLUoxetine (PROZAC) 20 MG capsule, Take 3 capsules (60 mg total) by mouth daily., Disp: 270 capsule, Rfl: 1   LORazepam (ATIVAN) 1 MG tablet, 1 po qd prn and 2 po qhs., Disp: 90 tablet, Rfl: 5   ondansetron (ZOFRAN ODT) 4 MG disintegrating tablet, Take 1 tablet (4 mg total) by mouth  every 8 (eight) hours as needed for nausea or vomiting., Disp: 20 tablet, Rfl: 5   SUMAtriptan (IMITREX) 100 MG tablet, Take 1 tablet earliest onset of migraine.  May repeat in 2 hours if headache persists or recurs.  Maximum 2 tablets in 24 hours., Disp: 10 tablet, Rfl: 5   CLARAVIS 40 MG capsule, Take 40 mg by mouth 2 (two) times daily. (Patient not taking: No sig reported), Disp: , Rfl:    COLLAGEN PO, Take by mouth. (Patient not taking: No sig reported), Disp: , Rfl:    L-Methylfolate-Algae (DEPLIN 15) 15-90.314 MG CAPS, Take 15 mg by mouth every morning.  (Patient not taking: Reported on 02/23/2021), Disp: 30 capsule, Rfl: 6   Melatonin 3-10 MG TABS, Take 3-10 mg by mouth at bedtime as needed. (Patient not taking: No sig reported), Disp: 30 tablet, Rfl: 11 Medication Side Effects:  possible palpitations and jitteriness from Wellbutrin. Unsure.   Family Medical/ Social History: Changes?  Full-time professor at [employer], husband has been laid off  [place]:  There were no vitals taken for this visit.There is no height or weight on file to calculate BMI.  General Appearance: Casual, Neat, and Obese  Eye Contact:  Good  Speech:  Clear and Coherent and Normal Rate  Volume:  Normal  Mood:  Euthymic  Affect:  Appropriate  Thought Process:  Goal Directed and Descriptions of Associations: Intact  Orientation:  Full (Time, Place, and Person)  Thought Content: Logical   Suicidal Thoughts:  No  Homicidal Thoughts:  No  Memory:  WNL  Judgement:  Good  Insight:  Good  Psychomotor Activity:  Normal  Concentration:  Concentration: Good and Attention Span: Good  Recall:  Good  Fund of Knowledge: Good  Language: Good  Assets:  Desire for Improvement Financial Resources/Insurance Housing Transportation Vocational/Educational  ADL's:  Intact  Cognition: WNL  Prognosis:  Good   GeneSight test results are in chart had moderately reduced folic acid conversion  03/11/2021 ER visit notes reviewed Labs CBC, only abnormality was white blood cell count of 10.7 BMP normal except glucose 114, not fasting. Troponin level negative x2  DIAGNOSES:    ICD-10-CM   1. Generalized anxiety disorder  F41.1     2. 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 Insomnia, unspecified type  G47.00        Receiving Psychotherapy: No    RECOMMENDATIONS:  PDMP was reviewed.  Last Ativan filled 04/05/2021 I provided 40 minutes of face to face time during this encounter, including time spent before and after the visit in  records review, medical decision making, and charting.  She has responded well to the Wellbutrin.  Because she has been a bit "down" over the past couple of weeks I am tempted to increase the dose, but she has had some physical symptoms that could be from the Wellbutrin, including palpitations and a little shakiness in her hands.  Plus she is going out of the country at the end of this week so I do not want to make any med changes under those circumstances either.  We agree for her to continue the same dose of Wellbutrin, see how she does and reevaluate in 1 month. Continue Wellbutrin XL 150 mg, 1 p.o. every morning. Continue Prozac 20 mg, 3 p.o. daily.   Continue Ativan 1 mg, 1 p.o. daily as needed.  2 p.o. nightly routinely. Return in 4 weeks.  Donnal Moat, PA-C

## 2021-05-18 ENCOUNTER — Telehealth: Payer: Self-pay | Admitting: Physician Assistant

## 2021-05-18 NOTE — Telephone Encounter (Signed)
Pt left a message that she has been experiencing sick headaches recently over the last couple of days. She talked to teresa about this and there was a medicine that teresa said she could stop to see if headaches went away. She would like to know which medicine and is it ok to stop the med. Please give her a call at [phone]

## 2021-05-19 NOTE — Telephone Encounter (Signed)
Please review

## 2021-05-19 NOTE — Telephone Encounter (Signed)
noted 

## 2021-05-19 NOTE — Telephone Encounter (Signed)
Pt stated last week it worsened.She does not know if its something else but has vertigo and nausea with the headaches.It did start overseas.She does not have a menstrual cycle.She believes it is the Wellbutrin causing it.She left a message with her PCP and will call us back if it worsens before she can see them.

## 2021-05-19 NOTE — Telephone Encounter (Signed)
When did the headaches worsen?  I know she has migraines so does not feel like those are something else?  She traveled out of the country recently, did the headaches start while overseas or once she came home?  In relation to menstrual cycle?  The most recent addition was Wellbutrin but she has been on that for approximately 2 months now and she was not having headaches after starting that from what I can tell.  That would be the one to stop if we make any medication changes.  Before doing that, I would recommend she see her primary care provider for evaluation to make sure nothing else is going on.

## 2021-05-21 ENCOUNTER — Telehealth (INDEPENDENT_AMBULATORY_CARE_PROVIDER_SITE_OTHER): Payer: BC Managed Care – PPO | Admitting: Family Medicine

## 2021-05-21 ENCOUNTER — Encounter: Payer: Self-pay | Admitting: Family Medicine

## 2021-05-21 VITALS — BP 120/73 | Ht 64.0 in

## 2021-05-21 DIAGNOSIS — I493 Ventricular premature depolarization: Secondary | ICD-10-CM

## 2021-05-21 DIAGNOSIS — R42 Dizziness and giddiness: Secondary | ICD-10-CM

## 2021-05-21 DIAGNOSIS — H9312 Tinnitus, left ear: Secondary | ICD-10-CM

## 2021-05-21 MED ORDER — MECLIZINE HCL 25 MG PO TABS: 25.0000 mg | ORAL_TABLET | Freq: Three times a day (TID) | ORAL | 0 refills | Status: DC | PRN

## 2021-05-21 NOTE — Progress Notes (Signed)
Virtual Visit via Video Note I connected with Yolanda Wolfe on 05/21/21 by a video enabled telemedicine application and verified that I am speaking with the correct person using two identifiers.  Location patient: home/work Location provider:work office Persons participating in the virtual visit: patient, provider  I discussed the limitations of evaluation and management by telemedicine and the availability of in person appointments. The patient expressed understanding and agreed to proceed.  Chief Complaint  Patient presents with   Medication Management  HPI: Yolanda Wolfe is a 45 year old female with history of HLD, allergies,migraine headache,depression,and anxiety concerned about episodes of dizziness that is started a couple months ago. She is suspecting that one of her medication could be aggravating problem. Wellbutrin XR 150 mg was added a couple months ago to help with depression. Fluoxetine dose was increased 6-12 months ago. She has been on Aimovig for a few months and does not seem to be having side effects.  Dizziness is described as spinning like sensation. Episodes last a few seconds, it is "quickly." Sometimes associated nausea. It happens when she gets up and when lying in bed. Exacerbated by movement. Negative for fever, chills, change in appetite, sore throat, cough,wheezing,vomiting, abdominal pain, or changes in bowel habits. Left-sided tinnitus seems to be getting worse. She received a call from ENT's office, did not call back and would like to see provider. No associated hearing changes.  She has had vertigo years ago but more severe.  "Flutter" sensation, intermittent for about 2 months. She was evaluated in the ER, problem is not often now. Negative for associated CP,diaphoresis,or SOB. She has not identified exacerbating or alleviating factors. She does not have symptoms with exertion.  Lab Results  Component Value Date   CREATININE 0.86  03/11/2021   BUN 17 03/11/2021   NA 137 03/11/2021   K 3.8 03/11/2021   CL 102 03/11/2021   CO2 25 03/11/2021   Lab Results  Component Value Date   WBC 10.7 (H) 03/11/2021   HGB 13.3 03/11/2021   HCT 39.7 03/11/2021   MCV 87.6 03/11/2021   PLT 345 03/11/2021   Lab Results  Component Value Date   TSH 1.39 06/11/2020   EKG done in the ER: SR,occasional PVC's, normal axis and intervals. No other EKG for comparison.  ROS: See pertinent positives and negatives per HPI.  Past Medical History:  Diagnosis Date   Anxiety    Blood transfusion without reported diagnosis    Depression    Frequent headaches    GERD (gastroesophageal reflux disease)    Heart murmur    Migraine    UTI (lower urinary tract infection)    Past Surgical History:  Procedure Laterality Date   ABDOMINAL HYSTERECTOMY  2009   Emergency right after C-Section   CESAREAN SECTION     Family History  Problem Relation Age of Onset   Cancer Maternal Grandmother        breast   Heart disease Maternal Grandfather    Alcohol abuse Paternal Grandfather    Dementia Paternal Grandmother    Cancer Mother        neuroendocrine cancer   Cancer Father        skin   Healthy Daughter    Social History   Socioeconomic History   Marital status: Married    Spouse name: Not on file   Number of children: 1   Years of education: Not on file   Highest education level: Not on file  Occupational History  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    Remarried last year, husband is Astronomer.   She and ex husband coparent their almost 71 yo daughter.   She grew up in [place], went to [employer], married 1st husband, traumatic birth see HPI.       Caffeine-no more than 1 per day, and not over 4 days a week.    Legal-bancruptcy when she got divorced.    Religion-Quaker.   Right handed   Social Determinants of Health   Financial Resource Strain: Low Risk    Difficulty of Paying Living Expenses: Not hard at all  Food Insecurity: No Food Insecurity   Worried About Charity fundraiser in the Last Year: Never true   Ran Out of Food in the Last Year: Never true  Transportation Needs: No Transportation Needs   Lack of Transportation (Medical): No   Lack of Transportation (Non-Medical): No  Physical Activity: Inactive   Days of Exercise per Week: 0 days   Minutes of Exercise per Session: 0 min  Stress: Stress Concern Present   Feeling of Stress : Very much  Social Connections: Moderately Isolated   Frequency of Communication with Friends and Family: Three times a week   Frequency of Social Gatherings with Friends and Family: Once a week   Attends Religious Services: 1 to 4 times per year   Active Member of [employer] or Organizations: No   Attends Archivist Meetings: Never   Marital Status: Widowed  Human resources officer Violence: Not At Risk   Fear of Current or Ex-Partner: No   Emotionally Abused: No   Physically Abused: No   Sexually Abused: No    Current Outpatient Medications:    buPROPion (WELLBUTRIN XL) 150 MG 24 hr tablet, TAKE 1 TABLET BY MOUTH EVERY DAY, Disp: 90 tablet, Rfl: 0   Erenumab-aooe (AIMOVIG) 140 MG/ML SOAJ, Inject 140 mg into the skin every 28 (twenty-eight) days., Disp: 1.12 mL, Rfl: 5   FLUoxetine (PROZAC) 20 MG capsule, Take 3 capsules (60 mg total) by mouth daily., Disp: 270 capsule, Rfl: 1   LORazepam (ATIVAN) 1 MG tablet, 1  po qd prn and 2 po qhs., Disp: 90 tablet, Rfl: 5   meclizine (ANTIVERT) 25 MG tablet, Take 1 tablet (25 mg total) by mouth 3 (three) times daily as needed for dizziness., Disp: 30 tablet, Rfl: 0   ondansetron (ZOFRAN ODT) 4 MG disintegrating tablet, Take 1 tablet (4 mg total) by mouth every 8 (eight) hours as needed for nausea or vomiting., Disp: 20 tablet, Rfl: 5   SUMAtriptan (IMITREX) 100 MG tablet, Take 1 tablet earliest onset of migraine.  May repeat in 2 hours if headache persists or recurs.  Maximum 2 tablets in 24 hours., Disp: 10 tablet, Rfl: 5  EXAM:  VITALS per patient if applicable:BP 409/73   Ht 5'  4" (1.626 m)   LMP  (LMP Unknown)   BMI 30.04 kg/m   GENERAL: alert, oriented, appears well and in no acute distress  HEENT: atraumatic, conjunctiva clear, no obvious abnormalities on inspection of external nose and ears  NECK: normal movements of the head and neck  LUNGS: on inspection no signs of respiratory distress, breathing rate appears normal, no obvious gross SOB, gasping or wheezing  CV: no obvious cyanosis  MS: moves all visible extremities without noticeable abnormality  PSYCH/NEURO: pleasant and cooperative, no obvious depression or anxiety, speech and thought processing grossly intact  ASSESSMENT AND PLAN:  Discussed the following assessment and plan:  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 ENT referral placed. Wellbutrin can definitely be aggravating problem and could be also contributing to dizziness and palpitation.  Dizziness - Plan: meclizine (ANTIVERT) 25 MG tablet We discussed possible etiologies, history suggest vertigo. Recommend meclizine 25 mg 3 times daily as needed, we discussed some side effects. Fall precautions. If not better or if problem gets worse, recommend addressing problem with ENT and neurologist. Instructed about warning signs.  PVC (premature ventricular contraction) Seen  on EKG done in the ED 02/2021. Palpitation have improved. We discussed EKG findings. For now she prefers to hold on cardiology referral. Instructed about warning signs.  We discussed possible serious and likely etiologies, options for evaluation and workup, limitations of telemedicine visit vs in person visit, treatment, treatment risks and precautions.  I discussed the assessment and treatment plan with the patient.  Ms. Engelken was provided an opportunity to ask questions and all were answered.  She agreed with the plan and demonstrated an understanding of the instructions.  Return if symptoms worsen or fail to improve.  Autry Droege G. Martinique, MD  [facility]. [place] office.

## 2021-05-25 ENCOUNTER — Telehealth: Payer: Self-pay | Admitting: Neurology

## 2021-05-25 MED ORDER — AIMOVIG 140 MG/ML ~~LOC~~ SOAJ: 140.0000 mg | SUBCUTANEOUS | 5 refills | Status: DC

## 2021-05-25 NOTE — Telephone Encounter (Signed)
Pt is out of her aimovig, pharmacy said they sent a refill 12/1. It may be a PA issue. Would like a call

## 2021-05-28 NOTE — Telephone Encounter (Signed)
F/u   Yolanda Wolfe (Key: [id number]) Aimovig 140MG /ML auto-injectors   Form Blue Building control surveyor Form (CB) Created 12 minutes ago Sent to Plan 9 minutes ago Plan Response 9 minutes ago Submit Clinical Questions less than a minute ago Determination Wait for Determination Please wait for [employer] to return a determination.   Your information has been submitted to [place]. Blue Cross [place] will review the request and notify you of the determination decision directly, typically within 72 hours of receiving all information.  You will also receive your request decision electronically. To check for an update later, open this request again from your dashboard.  If [employer] [place] has not responded within the specified timeframe or if you have any questions about your PA submission, contact [place] directly at [phone].

## 2021-05-31 ENCOUNTER — Telehealth: Payer: Self-pay

## 2021-05-31 NOTE — Telephone Encounter (Signed)
New message   Yolanda Wolfe (Key: [id number]) Rx #: [id number] Aimovig 140MG /ML auto-injectors   Form Blue Cross [place] Commercial Electronic Request Form (CB) Created 6 days ago Sent to Plan 2 minutes ago Plan Response 2 minutes ago Submit Clinical Questions less than a minute ago Determination Wait for Determination Please wait for [employer] to return a determination.  Your information has been submitted to [place]. Blue Cross [place] will review the request and notify you of the determination decision directly, typically within 72 hours of receiving all information.  You will also receive your request decision electronically. To check for an update later, open this request again from your dashboard.  If [employer] [place] has not responded within the specified timeframe or if you have any questions about your PA submission, contact [place] directly at [phone].

## 2021-06-01 ENCOUNTER — Telehealth: Payer: Self-pay | Admitting: Neurology

## 2021-06-01 ENCOUNTER — Telehealth: Payer: Self-pay

## 2021-06-01 MED ORDER — EMGALITY 120 MG/ML ~~LOC~~ SOAJ: 120.0000 mg | SUBCUTANEOUS | 5 refills | Status: DC

## 2021-06-01 MED ORDER — EMGALITY 120 MG/ML ~~LOC~~ SOAJ: 240.0000 mg | Freq: Once | SUBCUTANEOUS | 0 refills | Status: AC

## 2021-06-01 NOTE — Addendum Note (Signed)
Addended by: Venetia Night on: 06/01/2021 03:46 PM   Modules accepted: Orders

## 2021-06-01 NOTE — Telephone Encounter (Signed)
Pt advised 1 as Aimovig does not appear to be helping, we can change to Emgality every 28 days - if we know that her insurance will cover it, we can give her a sample of Emgality instead of the Aimovig (first dose of Emgality includes 2 injections but then just 1 injection every 28 days thereafter).  Will try the Emgality.  2  If triptans have not been working, she can also pick up samples of Ubrelvy - take 1 tablet earliest onset of migraine, may repeat once in 2 hours (maximum 2 tablets in 24 hours)

## 2021-06-01 NOTE — Telephone Encounter (Signed)
New message   Your information has been submitted to [place]. Blue Cross [place] will review the request and notify you of the determination decision directly, typically within 72 hours of receiving all information.  You will also receive your request decision electronically. To check for an update later, open this request again from your dashboard.  If [employer] [place] has not responded within the specified timeframe or if you have any questions about your PA submission, contact [place] directly at [phone].  Alec Gilpatrick (Key: [id number]) Emgality 120MG /ML auto-injectors (migraine)   Form Blue Building control surveyor Form (CB) Created 17 minutes ago Sent to Plan 14 minutes ago Plan Response 13 minutes ago Submit Clinical Questions 1 minute ago Determination Wait for Determination Please wait for [employer] to return a determination.

## 2021-06-01 NOTE — Telephone Encounter (Addendum)
Medication Samples have been provided to the patient.  Drug name: Emgality       Strength: 120 mg        Qty: 1  LOT: [id number]  Exp.Date: 01/09/23  Dosing instructions: every 28 days  The patient has been instructed regarding the correct time, dose, and frequency of taking this medication, including desired effects and most common side effects.   Venetia Night 3:42 PM 06/01/2021   Medication Samples have been provided to the patient.  Drug name: Roselyn Meier       Strength: 100 mg        Qty: 4  LOT: [id number]  Exp.Date: 12/2021  Dosing instructions: as needed  The patient has been instructed regarding the correct time, dose, and frequency of taking this medication, including desired effects and most common side effects.   Venetia Night 3:44 PM 06/01/2021

## 2021-06-01 NOTE — Telephone Encounter (Signed)
F/u   Yolanda Wolfe (Key: [id number]) Emgality 120MG /ML auto-injectors (migraine)   Form Blue Building control surveyor Form (CB) Created 24 minutes ago Sent to Plan 20 minutes ago Plan Response 20 minutes ago Submit Clinical Questions 8 minutes ago Determination Wait for Determination Please wait for [employer] to return a determination.

## 2021-06-01 NOTE — Telephone Encounter (Signed)
Beza Cardiff (Key: [id number]) Emgality 120MG/ML auto-injectors (migraine)   Form Blue Building control surveyor Form (CB) Created 1 hour ago Sent to Plan 1 hour ago Plan Response 1 hour ago Submit Clinical Questions 1 hour ago Determination Favorable 24 minutes ago Message from Plan Effective from 06/01/2021 through 08/23/2021. Starter kit included in authorization

## 2021-06-01 NOTE — Telephone Encounter (Signed)
Patient stated she needs to see jaffe before her next appt or she needs to change medication. She cant get relief from HA. She called her insurance and they stated all the meds were covered

## 2021-06-01 NOTE — Telephone Encounter (Signed)
How frequent or the headaches (on average, how many days a week/month are they occurring)?  9 days in November,8 days this month with some Vertigo.  How long do the headaches last?  Three days now Verify what preventative medication and dose you are taking (e.g. topiramate, propranolol, amitriptyline, Emgality, etc)  Aimovig waiting on PA.  Verify which rescue medication you are taking (triptan, Advil, Excedrin, Aleve, Ubrelvy, etc)  Sumatriptan  How often are you taking pain relievers/analgesics/rescue mediction?  NONE   Patient coming in for [place] sample today.  Jari Sportsman what is the status of the PA.

## 2021-06-01 NOTE — Telephone Encounter (Signed)
F/u  This request has received a Unfavorable outcome.  Please see letter faxed to your office for details on this adverse benefit determination.  Please note any additional information provided by [facility] at the bottom of this request.

## 2021-06-01 NOTE — Telephone Encounter (Signed)
F//u -- renewal   Your information has been submitted to [place]. Blue Cross [place] will review the request and notify you of the determination decision directly, typically within 72 hours of receiving all information.  You will also receive your request decision electronically. To check for an update later, open this request again from your dashboard.  If [employer] [place] has not responded within the specified timeframe or if you have any questions about your PA submission, contact [place] directly at [phone].  Ranee Koepp (Key: [id number]) Aimovig 140MG /ML auto-injectors   Form Blue Building control surveyor Form (CB) Created 4 minutes ago Sent to Plan 4 minutes ago Plan Response 4 minutes ago Submit Clinical Questions less than a minute ago Determination Wait for Determination Please wait for [employer] to return a determination.

## 2021-06-02 DIAGNOSIS — Z01419 Encounter for gynecological examination (general) (routine) without abnormal findings: Secondary | ICD-10-CM | POA: Diagnosis not present

## 2021-06-02 DIAGNOSIS — R319 Hematuria, unspecified: Secondary | ICD-10-CM | POA: Diagnosis not present

## 2021-06-02 DIAGNOSIS — Z6832 Body mass index (BMI) 32.0-32.9, adult: Secondary | ICD-10-CM | POA: Diagnosis not present

## 2021-06-03 ENCOUNTER — Encounter: Payer: Self-pay | Admitting: Physician Assistant

## 2021-06-03 ENCOUNTER — Ambulatory Visit: Payer: BC Managed Care – PPO | Admitting: Physician Assistant

## 2021-06-03 ENCOUNTER — Other Ambulatory Visit: Payer: Self-pay

## 2021-06-03 DIAGNOSIS — F3341 Major depressive disorder, recurrent, in partial remission: Secondary | ICD-10-CM

## 2021-06-03 DIAGNOSIS — R519 Headache, unspecified: Secondary | ICD-10-CM

## 2021-06-03 DIAGNOSIS — G4700 Insomnia, unspecified: Secondary | ICD-10-CM

## 2021-06-03 DIAGNOSIS — F411 Generalized anxiety disorder: Secondary | ICD-10-CM

## 2021-06-03 DIAGNOSIS — G8929 Other chronic pain: Secondary | ICD-10-CM

## 2021-06-03 MED ORDER — BUPROPION HCL ER (XL) 150 MG PO TB24: 150.0000 mg | ORAL_TABLET | Freq: Every day | ORAL | 0 refills | Status: DC

## 2021-06-03 NOTE — Progress Notes (Signed)
[facility]  Patient ID: Yolanda Wolfe,  MRN: [id number]  PCP: Martinique, Betty G, MD  Date of Evaluation: 06/03/2021  Time spent:30 minutes  Chief Complaint:  Chief Complaint   Anxiety; Depression; Follow-up      HISTORY/CURRENT STATUS: HPI  For routine med check.  Having H/A daily for almost a month, seemed to worsen around the time of starting Wellbutrin.  Neurology recently changed Aimovig to [employer] and Imitrex to [place].  That only occurred approximately 3 days ago and she has felt better since taking the Emgality injection.  Has not had to use the [place] yet.  Has a lot of tension in her neck and shoulders.  Massage helps some.  She does feel better mentally with the Wellbutrin.  Is happier, mood is more stable.  She is more upbeat, able to enjoy things more.  Energy and motivation are better.  Not crying easily, not isolating.  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  No suicidal or homicidal thoughts.  She is taking the Ativan more to help her sleep than for anxiety.  It is not working as well as it once did.  If she does not take it she cannot go to sleep.  Whether she takes it or not though she still wakes up several times during the night although is able to go back to sleep.  Not needing the Ativan during the day usually.  Patient denies increased energy with decreased need for sleep, no increased talkativeness, no racing thoughts, no impulsivity or risky behaviors, no increased spending, no increased libido, no grandiosity, no increased irritability or anger, and no hallucinations.  Denies dizziness, syncope, seizures, numbness, tingling, tremor, tics, unsteady gait, slurred speech, confusion. Denies muscle or joint pain, stiffness, or dystonia.  Individual Medical History/ Review of Systems: Changes? :Yes   worsening migraines  Past medications for mental health diagnoses include: Amitriptylline for sleep? Celexa, Lunesta, Ativan,  Zoloft, Belsomra, Temazapam Topamax for migraines  Allergies: Patient has no known allergies.  Current Medications:  Current Outpatient Medications:    FLUoxetine (PROZAC) 20 MG capsule, Take 3 capsules (60 mg total) by mouth daily., Disp: 270 capsule, Rfl: 1   Galcanezumab-gnlm (EMGALITY) 120 MG/ML SOAJ, Inject 120 mg into the skin every 28 (twenty-eight) days., Disp: 1.12 mL, Rfl: 5   LORazepam (ATIVAN) 1 MG tablet, 1 po qd prn and 2 po qhs., Disp: 90 tablet, Rfl: 5   meclizine (ANTIVERT) 25 MG tablet, Take 1 tablet (25 mg total) by mouth 3 (three) times daily as needed for dizziness., Disp: 30 tablet, Rfl: 0   ondansetron (ZOFRAN ODT) 4 MG disintegrating tablet, Take 1 tablet (4 mg total) by mouth every 8 (eight) hours as needed for nausea or vomiting., Disp: 20 tablet, Rfl: 5   Ubrogepant (UBRELVY) 50 MG TABS, Ubrelvy 50 mg tablet  Take by oral route as needed., Disp: , Rfl:    buPROPion (WELLBUTRIN XL) 150 MG 24 hr tablet, Take 1 tablet (150 mg total) by mouth daily., Disp: 90 tablet, Rfl: 0   SUMAtriptan (IMITREX) 100 MG tablet, Take 1 tablet earliest onset of migraine.  May repeat in 2 hours if headache persists or recurs.  Maximum 2 tablets in 24 hours. (Patient not taking: Reported on 06/03/2021), Disp: 10 tablet, Rfl: 5 Medication Side Effects: headache  Family Medical/ Social History: Changes?  No  MENTAL HEALTH EXAM:  There were no vitals taken for this visit.There is no height or weight on file to calculate  BMI.  General Appearance: Casual, Neat, and Obese  Eye Contact:  Good  Speech:  Clear and Coherent and Normal Rate  Volume:  Normal  Mood:  Euthymic  Affect:  Appropriate  Thought Process:  Goal Directed and Descriptions of Associations: Intact  Orientation:  Full (Time, Place, and Person)  Thought Content: Logical   Suicidal Thoughts:  No  Homicidal Thoughts:  No  Memory:  WNL  Judgement:  Good  Insight:  Good  Psychomotor Activity:  Normal  Concentration:   Concentration: Good and Attention Span: Good  Recall:  Good  Fund of Knowledge: Good  Language: Good  Assets:  Desire for Improvement  ADL's:  Intact  Cognition: WNL  Prognosis:  Good   GeneSight test results are in chart had moderately reduced folic acid conversion   DIAGNOSES:    ICD-10-CM   1. Depression, major, recurrent, in partial remission ([place])  F33.41     2. Insomnia, unspecified type  G47.00     3. Generalized anxiety disorder  F41.1     4. Chronic nonintractable headache, unspecified headache type  R51.9    G89.29         Receiving Psychotherapy: No    RECOMMENDATIONS:  PDMP was reviewed.  Ativan filled 04/05/2021. I provided 30 minutes of face to face time during this encounter, including time spent before and after the visit in records review, medical decision making, counseling pertinent to today's visit, and charting.  The headaches worsened around the same time that the Wellbutrin was started, but we are not sure if it was coincidental or related to the Wellbutrin.  She has felt better mentally, happier and more upbeat since going on the Wellbutrin so neither of [place] want to stop it if we do not have to.  Hopefully the new treatment plan including Emgality and Roselyn Meier will help migraines decrease in frequency and severity but if not we will wean off the Wellbutrin to see if that helps the migraines.  If the headaches become unbearable before her next appointment next month, then she will call and I will wean off the Wellbutrin.  If we do it now though we will not know if the [place], or the Wellbutrin is what has helped.  She understands. We also discussed the Ativan.  It is not working as well as it once did to help her sleep.  She usually takes 2 mg right before bed which helps her go to sleep but not always stay asleep.  If she does not take it she cannot go to sleep hardly at all.  We discussed Valium which will be a good alternative to the Ativan, as it  is a good muscle relaxer as well as anxiety medication and can help with insomnia.  Neither of [place] want to change anything right before Christmas but will keep that in the back of our minds.  Continue Wellbutrin XL 150 mg, 1 p.o. every morning. Continue Prozac 20 mg, 3 p.o. daily.   Continue Ativan 1 mg, 1 p.o. daily with supper and then 2 p.o. nightly, approximately 45 minutes before she wants to go to sleep.   Return in 4 weeks.  Donnal Moat, PA-C

## 2021-06-08 ENCOUNTER — Telehealth: Payer: Self-pay | Admitting: Physician Assistant

## 2021-06-08 ENCOUNTER — Telehealth: Payer: Self-pay

## 2021-06-08 ENCOUNTER — Other Ambulatory Visit: Payer: Self-pay | Admitting: Physician Assistant

## 2021-06-08 ENCOUNTER — Encounter: Payer: Self-pay | Admitting: Family Medicine

## 2021-06-08 MED ORDER — BUPROPION HCL 75 MG PO TABS: ORAL_TABLET | ORAL | 0 refills | Status: DC

## 2021-06-08 NOTE — Telephone Encounter (Signed)
Next visit is 06/25/21. Yolanda Wolfe called and said after talking with her GP they have decided to wean her off the Wellbutrin. She wants to know what is needed to have her taper off of it? Please call her at [phone].

## 2021-06-08 NOTE — Telephone Encounter (Signed)
[id number] - [place], [place] - [address] [place] [address]

## 2021-06-08 NOTE — Telephone Encounter (Signed)
I'll send in a Rx for a short acting Wellbutrin.  Stop the Wellbutrin XL and take the prescription I am sending in, it will be 1 daily for 1 week and then 1/2 pill daily for a week and then stop.

## 2021-06-08 NOTE — Telephone Encounter (Signed)
Prescription was sent

## 2021-06-08 NOTE — Telephone Encounter (Signed)
Message from pt:  Also, I need to know if Dr Martinique thinks I should wean off the Wellbutrin. Donnald Garre only been taking it since September and that is when these major nausea issues started. If theres any chance the Wellbutrin is causing this, I want to stop taking it and need to know how I should step it down and wean off it. Im desperately and cant continue feeling this way.

## 2021-06-08 NOTE — Telephone Encounter (Signed)
She can start taking medication every other day for 10 days and discontinue. Recommend letting her psychiatrist know about her concerns with this medication and may need to discuss other options for depression treatment. Thanks, BJ

## 2021-06-08 NOTE — Telephone Encounter (Signed)
Please review

## 2021-06-08 NOTE — Telephone Encounter (Signed)
See my chart encounter.

## 2021-06-09 ENCOUNTER — Telehealth: Payer: Self-pay

## 2021-06-09 NOTE — Telephone Encounter (Signed)
New message   Fax sent to the plan Your PA has been faxed to the plan as a paper copy. Please contact the plan directly if you haven't received a determination in a typical timeframe.  You will be notified of the determination electronically and via fax.  Yolanda Wolfe (Key: [id number]) Aimovig 140MG /ML auto-injectors   Form Letter of Consent Drug Appeal Form Appeal Created 6 days ago Sent to Plan 1 minute ago Determination Wait for Determination Please wait for the payer to return a determination.

## 2021-06-10 NOTE — Telephone Encounter (Signed)
F/u   Fax received from [facility] of [place]  Medication Aimovig 140 mg  Effective date 12.9.22 to 12.8.23

## 2021-06-18 DIAGNOSIS — H8111 Benign paroxysmal vertigo, right ear: Secondary | ICD-10-CM | POA: Diagnosis not present

## 2021-06-18 DIAGNOSIS — H9042 Sensorineural hearing loss, unilateral, left ear, with unrestricted hearing on the contralateral side: Secondary | ICD-10-CM | POA: Diagnosis not present

## 2021-06-18 DIAGNOSIS — R42 Dizziness and giddiness: Secondary | ICD-10-CM | POA: Diagnosis not present

## 2021-06-18 DIAGNOSIS — H832X2 Labyrinthine dysfunction, left ear: Secondary | ICD-10-CM | POA: Diagnosis not present

## 2021-06-18 DIAGNOSIS — H9312 Tinnitus, left ear: Secondary | ICD-10-CM | POA: Diagnosis not present

## 2021-06-23 ENCOUNTER — Other Ambulatory Visit: Payer: Self-pay | Admitting: Neurology

## 2021-06-23 ENCOUNTER — Encounter: Payer: Self-pay | Admitting: Neurology

## 2021-06-23 MED ORDER — UBRELVY 100 MG PO TABS: 1.0000 | ORAL_TABLET | ORAL | 5 refills | Status: DC | PRN

## 2021-06-25 ENCOUNTER — Telehealth: Payer: Self-pay

## 2021-06-25 ENCOUNTER — Telehealth (INDEPENDENT_AMBULATORY_CARE_PROVIDER_SITE_OTHER): Payer: BC Managed Care – PPO | Admitting: Physician Assistant

## 2021-06-25 ENCOUNTER — Encounter: Payer: Self-pay | Admitting: Physician Assistant

## 2021-06-25 ENCOUNTER — Other Ambulatory Visit: Payer: Self-pay

## 2021-06-25 DIAGNOSIS — F3341 Major depressive disorder, recurrent, in partial remission: Secondary | ICD-10-CM | POA: Diagnosis not present

## 2021-06-25 DIAGNOSIS — G4700 Insomnia, unspecified: Secondary | ICD-10-CM | POA: Diagnosis not present

## 2021-06-25 DIAGNOSIS — F411 Generalized anxiety disorder: Secondary | ICD-10-CM

## 2021-06-25 DIAGNOSIS — E7212 Methylenetetrahydrofolate reductase deficiency: Secondary | ICD-10-CM

## 2021-06-25 NOTE — Telephone Encounter (Signed)
F/u  [name] Key: [id number] - Rx #: [id number] Need help? Call us at [phone] Outcome Approvedtoday Effective from 06/25/2021 through 09/16/2021. Drug Roselyn Meier 100MG  tablets Form Blue Building control surveyor Form (CB) Original Claim Info 75

## 2021-06-25 NOTE — Progress Notes (Signed)
[facility]  Patient ID: Yolanda Wolfe,  MRN: [id number]  PCP: Martinique, Betty G, MD  Date of Evaluation: 06/25/2021 Time spent:20 minutes  Chief Complaint:  Chief Complaint   Anxiety; Depression; Insomnia; Follow-up    Virtual Visit via Telehealth  I connected with patient by a video enabled telemedicine application with their informed consent, and verified patient privacy and that I am speaking with the correct person using two identifiers.  I am private, in my office and the patient is in her car.  I discussed the limitations, risks, security and privacy concerns of performing an evaluation and management service by video and the availability of in person appointments. I also discussed with the patient that there may be a patient responsible charge related to this service. The patient expressed understanding and agreed to proceed.   I discussed the assessment and treatment plan with the patient. The patient was provided an opportunity to ask questions and all were answered. The patient agreed with the plan and demonstrated an understanding of the instructions.   The patient was advised to call back or seek an in-person evaluation if the symptoms worsen or if the condition fails to improve as anticipated.  I provided 20 minutes of non-face-to-face time during this encounter.    HISTORY/CURRENT STATUS: HPI  For routine med check.  Weaned off the Wellbutrin about a week ago.  We had discussed it, that it may be the cause of increased migraines.  She got to a point she couldn't take the H/A anymore, they were daily and debilitating. Has been H/A free for 3 days now! Feels good emotionally. "I'm in a good place right now."  She is able to enjoy things.  Energy and motivation are good.  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  She and her husband are doing well.  She sleeps well most of the time with the  Ativan.  Rarely takes it during the day because she does not notice any change in her mood so it is not worth it.  She does not have a lot of anxiety during the day anyway though.  Personal hygiene is normal.  No suicidal or homicidal thoughts.  Patient denies increased energy with decreased need for sleep, no increased talkativeness, no racing thoughts, no impulsivity or risky behaviors, no increased spending, no increased libido, no grandiosity, no increased irritability or anger, and no hallucinations.  Denies dizziness, syncope, seizures, numbness, tingling, tremor, tics, unsteady gait, slurred speech, confusion. Denies muscle or joint pain, stiffness, or dystonia.  Individual Medical History/ Review of Systems: Changes? :Yes   saw ENT for BPV, was given Valium, Antivert, exercises. Better. Has tinnitis. Only took the Valium once.   Past medications for mental health diagnoses include: Amitriptylline for sleep? Celexa, Lunesta, Ativan, Zoloft, Belsomra, Temazapam Topamax for migraines, Wellbutrin caused migraines to worsen  Allergies: Wellbutrin [bupropion]  Current Medications:  Current Outpatient Medications:    FLUoxetine (PROZAC) 20 MG capsule, Take 3 capsules (60 mg total) by mouth daily., Disp: 270 capsule, Rfl: 1   Galcanezumab-gnlm (EMGALITY) 120 MG/ML SOAJ, Inject 120 mg into the skin every 28 (twenty-eight) days., Disp: 1.12 mL, Rfl: 5   LORazepam (ATIVAN) 1 MG tablet, 1 po qd prn and 2 po qhs., Disp: 90 tablet, Rfl: 5   ondansetron (ZOFRAN ODT) 4 MG disintegrating tablet, Take 1 tablet (4 mg total) by mouth every 8 (eight) hours as needed for nausea  or vomiting., Disp: 20 tablet, Rfl: 5   Ubrogepant (UBRELVY) 100 MG TABS, Take 1 tablet by mouth as needed (May repeat after 2 hours.  Maximum 2 tablets in 24 hours.)., Disp: 16 tablet, Rfl: 5   buPROPion (WELLBUTRIN) 75 MG tablet, 1 p.o. daily for 7 days, then 1/2 pill p.o. for 1 week and then stop., Disp: 10 tablet, Rfl: 0    diazepam (VALIUM) 2 MG tablet, Take 2 mg by mouth 2 (two) times daily., Disp: , Rfl:    meclizine (ANTIVERT) 25 MG tablet, Take 1 tablet (25 mg total) by mouth 3 (three) times daily as needed for dizziness. (Patient not taking: Reported on 06/25/2021), Disp: 30 tablet, Rfl: 0 Medication Side Effects: headache  Family Medical/ Social History: Changes?  No  MENTAL HEALTH EXAM:  There were no vitals taken for this visit.There is no height or weight on file to calculate BMI.  General Appearance: Casual, Neat, and Obese  Eye Contact:  Good  Speech:  Clear and Coherent and Normal Rate  Volume:  Normal  Mood:  Euthymic  Affect:  Appropriate  Thought Process:  Goal Directed and Descriptions of Associations: Circumstantial  Orientation:  Full (Time, Place, and Person)  Thought Content: Logical   Suicidal Thoughts:  No  Homicidal Thoughts:  No  Memory:  WNL  Judgement:  Good  Insight:  Good  Psychomotor Activity:  Normal  Concentration:  Concentration: Good and Attention Span: Good  Recall:  Good  Fund of Knowledge: Good  Language: Good  Assets:  Desire for Improvement  ADL's:  Intact  Cognition: WNL  Prognosis:  Good   GeneSight test results are in chart had moderately reduced folic acid conversion   DIAGNOSES:    ICD-10-CM   1. Depression, major, recurrent, in partial remission ([place])  F33.41     2. Generalized anxiety disorder  F41.1     3. Insomnia, unspecified type  G47.00     4. 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  Last Ativan filled 06/06/2021. I provided 20 minutes of non-face-to-face time during this encounter, including time spent before and after the visit in records review, medical decision making, counseling pertinent to today's visit, and charting.  I'm glad she's H/A free now! And that mentally she's feeling good. We discussed the Valium, she can try  it instead of the Ativan, if the Ativan isn't helping her sleep, no more than 4 mg. We can change Ativan to that if it works better for her. We had discussed that in the past anyway, it is a good muscle relaxer as well as anxiolytic, and may help the headaches too. She'll let me know if she wants to change. For now, we agree to leave everything as is.  Continue Prozac 20 mg, 3 p.o. daily.   Continue Ativan 1 mg, 1 p.o. daily with supper and then 2 p.o. nightly, approximately 45 minutes before she wants to go to sleep.   Return in 2 months.  Donnal Moat, PA-C

## 2021-06-25 NOTE — Telephone Encounter (Addendum)
New message  ONIYA MANDARINO Key: [id number] - Rx #: [id number] Need help? Call us at [phone] Outcome Approvedtoday Effective from 06/25/2021 through 09/16/2021. Drug Roselyn Meier 100MG  tablets Form Blue Building control surveyor Form (CB) Original Claim Info 75

## 2021-07-01 ENCOUNTER — Telehealth: Payer: Self-pay

## 2021-07-01 NOTE — Telephone Encounter (Signed)
New message   Your information has been submitted to [place]. Blue Cross [place] will review the request and notify you of the determination decision directly, typically within 72 hours of receiving all information.  You will also receive your request decision electronically. To check for an update later, open this request again from your dashboard.  If [employer] [place] has not responded within the specified timeframe or if you have any questions about your PA submission, contact [place] directly at [phone].  Yolanda Wolfe Key: [id number] help? Call us at [phone] Status Sent to [employer] Drug Yolanda Wolfe 100MG  tablets Form [employer] [place] Commercial Electronic Request Form (CB)

## 2021-07-08 DIAGNOSIS — R42 Dizziness and giddiness: Secondary | ICD-10-CM | POA: Diagnosis not present

## 2021-07-26 ENCOUNTER — Other Ambulatory Visit: Payer: Self-pay | Admitting: Neurology

## 2021-07-27 ENCOUNTER — Telehealth: Payer: Self-pay

## 2021-07-27 NOTE — Telephone Encounter (Signed)
New message   Your information has been submitted to [place]. Blue Cross [place] will review the request and notify you of the determination decision directly, typically within 72 hours of receiving all information.  You will also receive your request decision electronically. To check for an update later, open this request again from your dashboard.  If [employer] [place] has not responded within the specified timeframe or if you have any questions about your PA submission, contact [place] directly at [phone].  Yolanda Wolfe Key: [id number] help? Call us at [phone] Status Sent to [employer] Drug Yolanda Wolfe 100MG  tablets Form [employer] [place] Commercial Electronic Request Form (CB)

## 2021-07-27 NOTE — Telephone Encounter (Signed)
ENNIFER [place] Key: [id number] - Rx #: [id number] Need help? Call us at [phone] Outcome Approvedon January 6 Effective from 06/25/2021 through 09/16/2021. Drug Roselyn Meier 100MG  tablets Form Blue Building control surveyor Form (CB) Original Claim Info 75

## 2021-07-28 ENCOUNTER — Telehealth: Payer: Self-pay

## 2021-07-28 NOTE — Telephone Encounter (Signed)
New message   Your information has been submitted to [place]. Blue Cross [place] will review the request and notify you of the determination decision directly, typically within 72 hours of receiving all information.  You will also receive your request decision electronically. To check for an update later, open this request again from your dashboard.  If [employer] [place] has not responded within the specified timeframe or if you have any questions about your PA submission, contact [place] directly at [phone].  Yolanda Wolfe (Key: [id number]) Emgality 120MG /ML auto-injectors (migraine)   Form Blue Building control surveyor Form (CB) Created 5 minutes ago Sent to Plan 5 minutes ago Plan Response 5 minutes ago Submit Clinical Questions less than a minute ago Determination Wait for Determination Please wait for [employer] to return a determination.

## 2021-08-02 NOTE — Telephone Encounter (Signed)
Received fax from [place] of [place] - please note this patient does not have RX benefits with a BCBS of [place] commercial line of business.   [name] Key: [id number] help? Call us at [phone] Outcome Cancelledon February 7 Your request has been cancelled Drug Roselyn Meier 100MG  tablets Form [employer] [place] [employer] Form (CB)

## 2021-08-03 ENCOUNTER — Telehealth: Payer: Self-pay | Admitting: Neurology

## 2021-08-03 NOTE — Telephone Encounter (Signed)
Patient called back and said she has not been seen at the headache clinic in [place] yet.  She was not aware that patient are not to see two different neurologists. Patient will stick with Dr. Tomi Likens for now, she said.

## 2021-08-03 NOTE — Telephone Encounter (Signed)
Patient called and left a voice mail requesting a sample of Ulbrevy, if possible.  She also said she is needing records sent to a headache clinic in [facility].   I returned her call and left a voice mail requesting a call back.

## 2021-08-03 NOTE — Telephone Encounter (Signed)
Telephone call to pt, Per Patient she needs another sample of urblevy until her PA has been approved.  Checked [employer] with the patient due to note from Roland, 07/27/21. Per Patient she has a new insurance through [place] since 07/21/21, Advised pt to upload cards so Jari Sportsman can check the new one.   Patient will come by and get samples 08/04/21, and upload new insurance cards.

## 2021-08-04 NOTE — Telephone Encounter (Signed)
Medication Samples have been provided to the patient.  Drug name: Roselyn Meier       Strength: 100 mg        Qty: 4  LOT: [id number]  Exp.Date: 11/2022  Dosing instructions: as needed  The patient has been instructed regarding the correct time, dose, and frequency of taking this medication, including desired effects and most common side effects.   Venetia Night 4:11 PM 08/04/2021

## 2021-08-06 ENCOUNTER — Telehealth: Payer: Self-pay

## 2021-08-06 NOTE — Telephone Encounter (Signed)
New message   Your information has been submitted to [place]. Blue Cross [place] will review the request and notify you of the determination decision directly, typically within 72 hours of receiving all information.  You will also receive your request decision electronically. To check for an update later, open this request again from your dashboard.  If [employer] [place] has not responded within the specified timeframe or if you have any questions about your PA submission, contact [place] directly at [phone].  Yolanda Wolfe (Key: [id number]) Emgality 120MG /ML auto-injectors (migraine)   Form Blue Building control surveyor Form (CB) Created 9 minutes ago Sent to Plan 8 minutes ago Plan Response 8 minutes ago Submit Clinical Questions 1 minute ago Determination Wait for Determination Please wait for [employer] to return a determination.

## 2021-08-25 ENCOUNTER — Encounter: Payer: Self-pay | Admitting: Neurology

## 2021-08-25 ENCOUNTER — Telehealth: Payer: Self-pay

## 2021-08-25 NOTE — Telephone Encounter (Signed)
Last OV 05/21/21 for acute reasons. Last CPE 02/05/20 ? ?LVM instruction for pt to call office to schedule CPE. ?

## 2021-08-25 NOTE — Telephone Encounter (Addendum)
  F/u   My apologies for the late response .   I called BCBS  @ [phone] speaking with Joni in prior authorization department to initiate prior authorization for both medication Emgality 120 mg & Ubrelvy 100 mg   Emgality 120 mg  Approved for 12 months   Ubrelvy 100 mg Approved for 12 months

## 2021-08-30 ENCOUNTER — Ambulatory Visit (INDEPENDENT_AMBULATORY_CARE_PROVIDER_SITE_OTHER): Payer: BC Managed Care – PPO | Admitting: Physician Assistant

## 2021-08-30 ENCOUNTER — Encounter: Payer: Self-pay | Admitting: Physician Assistant

## 2021-08-30 ENCOUNTER — Other Ambulatory Visit: Payer: Self-pay

## 2021-08-30 DIAGNOSIS — F3341 Major depressive disorder, recurrent, in partial remission: Secondary | ICD-10-CM | POA: Diagnosis not present

## 2021-08-30 DIAGNOSIS — F5105 Insomnia due to other mental disorder: Secondary | ICD-10-CM | POA: Diagnosis not present

## 2021-08-30 DIAGNOSIS — F99 Mental disorder, not otherwise specified: Secondary | ICD-10-CM | POA: Diagnosis not present

## 2021-08-30 DIAGNOSIS — F411 Generalized anxiety disorder: Secondary | ICD-10-CM | POA: Diagnosis not present

## 2021-08-30 MED ORDER — LORAZEPAM 1 MG PO TABS: ORAL_TABLET | ORAL | 5 refills | Status: DC

## 2021-08-30 MED ORDER — TRAZODONE HCL 100 MG PO TABS: 50.0000 mg | ORAL_TABLET | Freq: Every evening | ORAL | 1 refills | Status: DC | PRN

## 2021-08-30 NOTE — Progress Notes (Signed)
[facility] ? ?Patient ID: Yolanda Wolfe,  ?MRN: [id number] ? ?PCP: Martinique, Betty G, MD ? ?Date of Evaluation: 08/30/2021  ?time spent:20 minutes ? ?Chief Complaint:  ?Chief Complaint   ?Anxiety; Depression; Insomnia; Follow-up ?  ? ? ? ?HISTORY/CURRENT STATUS: ?HPI  For routine med check. ? ?Doing well with her mood. Patient denies loss of interest in usual activities and is able to enjoy things.  Denies decreased energy or motivation.  Appetite has not changed.  No extreme sadness, tearfulness, or feelings of hopelessness.  Denies any changes in concentration, making decisions or remembering things.  Denies suicidal or homicidal thoughts. ? ?The biggest problem she is having right now is her sleep.  She has even gotten to the point that she needs Ativan 3 mg just to go to sleep and sometimes it helps, sometimes it does not.  If she is able to go to sleep she does not tend to wake up a lot, but if she has trouble falling asleep then she is more restless the entire night.  When she wakes up she does not have a hard time going back to sleep though. ? ?Not having much anxiety during the day.  Of course she has her moments, but no big panic attacks.  Has not been needing the Ativan during the day, only at night. ? ?Patient denies increased energy with decreased need for sleep, no increased talkativeness, no racing thoughts, no impulsivity or risky behaviors, no increased spending, no increased libido, no grandiosity, no increased irritability or anger, and no hallucinations. ? ?Denies dizziness, syncope, seizures, numbness, tingling, tremor, tics, unsteady gait, slurred speech, confusion. Denies muscle or joint pain, stiffness, or dystonia. ? ?Individual Medical History/ Review of Systems: Changes? :Yes   new treatment of Emgality and Ubrelvy for migraines.  Migraines have been much better. ? ?Past medications for mental health diagnoses include: ?Amitriptylline for sleep? Celexa, Lunesta,  Ativan, Zoloft, Belsomra, Temazapam ?Topamax for migraines, Wellbutrin caused migraines to worsen ? ?Allergies: Wellbutrin [bupropion] ? ?Current Medications:  ?Current Outpatient Medications:  ?  EMGALITY 120 MG/ML SOAJ, INJECT '240MG'$  (2ML) INTO THE SKIN ONCE FOR 1 DOSE. LOADING DOSE., Disp: 1 mL, Rfl: 2 ?  FLUoxetine (PROZAC) 20 MG capsule, Take 3 capsules (60 mg total) by mouth daily., Disp: 270 capsule, Rfl: 1 ?  ondansetron (ZOFRAN ODT) 4 MG disintegrating tablet, Take 1 tablet (4 mg total) by mouth every 8 (eight) hours as needed for nausea or vomiting., Disp: 20 tablet, Rfl: 5 ?  traZODone (DESYREL) 100 MG tablet, Take 0.5-2 tablets (50-200 mg total) by mouth at bedtime as needed for sleep., Disp: 60 tablet, Rfl: 1 ?  tretinoin (RETIN-A) 0.1 % cream, SMARTSIG:Topical Every Evening, Disp: , Rfl:  ?  Ubrogepant (UBRELVY) 100 MG TABS, Take 1 tablet by mouth as needed (May repeat after 2 hours.  Maximum 2 tablets in 24 hours.)., Disp: 16 tablet, Rfl: 5 ?  LORazepam (ATIVAN) 1 MG tablet, 1 po qd prn and 2 po qhs., Disp: 90 tablet, Rfl: 5 ?  meclizine (ANTIVERT) 25 MG tablet, Take 1 tablet (25 mg total) by mouth 3 (three) times daily as needed for dizziness. (Patient not taking: Reported on 06/25/2021), Disp: 30 tablet, Rfl: 0 ?Medication Side Effects: headache ? ?Family Medical/ Social History: Changes?  No ? ?MENTAL HEALTH EXAM: ? ?There were no vitals taken for this visit.There is no height or weight on file to calculate BMI.  ?General Appearance: Casual, Neat, and Obese  ?Eye Contact:  Good  ?Speech:  Clear and Coherent and Normal Rate  ?Volume:  Normal  ?Mood:  Euthymic  ?Affect:  Appropriate  ?Thought Process:  Goal Directed and Descriptions of Associations: Circumstantial  ?Orientation:  Full (Time, Place, and Person)  ?Thought Content: Logical   ?Suicidal Thoughts:  No  ?Homicidal Thoughts:  No  ?Memory:  WNL  ?Judgement:  Good  ?Insight:  Good  ?Psychomotor Activity:  Normal  ?Concentration:  Concentration:  Good and Attention Span: Good  ?Recall:  Good  ?Fund of Knowledge: Good  ?Language: Good  ?Assets:  Desire for Improvement ?Financial Resources/Insurance ?Housing ?Transportation ?Vocational/Educational  ?ADL's:  Intact  ?Cognition: WNL  ?Prognosis:  Good  ? ?GeneSight test results are in chart had moderately reduced folic acid conversion ? ? ?DIAGNOSES:  ?  ICD-10-CM   ?1. Insomnia due to other mental disorder  F51.05   ? F99   ?  ?2. Depression, major, recurrent, in partial remission (HCC)  F33.41   ?  ?3. Generalized anxiety disorder  F41.1   ?  ? ? ? ?Receiving Psychotherapy: No  ? ? ?RECOMMENDATIONS:  ?PDMP was reviewed.  Last Ativan filled 08/13/2021. ?I provided 20 minutes of face to face time during this encounter, including time spent before and after the visit in records review, medical decision making, counseling pertinent to today's visit, and charting.  ?Sleep hygiene discussed.  Options for treatment are trazodone, mirtazapine, Ambien, Dayvigo, and others.  I would recommend starting with trazodone.  If that is not effective then she can call in approximately 2 weeks and we can send in Ambien.  Benefits, risks, and side effects of both medications were discussed.  Since she has been on Ativan 3 mg nightly routinely she does not need to stop it cold [place].  Decreased to 1 p.o. around 4:00 after she gets home from work and repeat 1 at bedtime, okay to take with the trazodone.  Do this for 4 or 5 days and then only take 1 p.o. late afternoon for another week to 2 weeks.  Then she can stop it altogether and take only the trazodone as needed. ? ?Continue Prozac 20 mg, 3 p.o. daily.   ?Continue Ativan 1 mg, see above. ?Start trazodone 100 mg, 1/2 to 2 pills nightly as needed sleep.   ?Return in 3 months. ? ?Donnal Moat, PA-C  ?

## 2021-09-07 ENCOUNTER — Telehealth: Payer: Self-pay | Admitting: Physician Assistant

## 2021-09-07 NOTE — Telephone Encounter (Signed)
Pt called. LVM reporting Trazodone is not going well. Go to next med on the list. Contact # [phone]  ?

## 2021-09-08 NOTE — Telephone Encounter (Signed)
LVM to rtc 

## 2021-09-09 NOTE — Telephone Encounter (Signed)
Pt stated 100 mg was too much so she has been taking 50 mg.It takes her awhile to fall asleep and she is still waking up in the middle of the night.She started back taking 3 ativan 1 in the evening and 2 before bed and it seems to be helping if you want her to continue

## 2021-09-09 NOTE — Telephone Encounter (Signed)
It is okay to continue the Ativan 1 mg, 1 in the early evening and then 2 before bed since that is what is helping.  If it stops being effective, call and we will try one of the others that she and I talked about.  Thank you.

## 2021-09-09 NOTE — Telephone Encounter (Signed)
LVM with info

## 2021-09-10 ENCOUNTER — Other Ambulatory Visit: Payer: Self-pay | Admitting: Otolaryngology

## 2021-09-10 DIAGNOSIS — H93A1 Pulsatile tinnitus, right ear: Secondary | ICD-10-CM

## 2021-09-17 ENCOUNTER — Other Ambulatory Visit (HOSPITAL_COMMUNITY): Payer: Self-pay

## 2021-09-23 ENCOUNTER — Other Ambulatory Visit: Payer: Self-pay | Admitting: Physician Assistant

## 2021-09-23 ENCOUNTER — Ambulatory Visit
Admission: RE | Admit: 2021-09-23 | Discharge: 2021-09-23 | Disposition: A | Discharge status: Home / Self Care | Payer: BC Managed Care – PPO | Source: Ambulatory Visit | Attending: Otolaryngology | Admitting: Otolaryngology

## 2021-09-23 DIAGNOSIS — H93A1 Pulsatile tinnitus, right ear: Secondary | ICD-10-CM

## 2021-09-23 MED ORDER — IOPAMIDOL (ISOVUE-370) INJECTION 76%
75.0000 mL | Freq: Once | INTRAVENOUS | Status: AC | PRN
  Administered 2021-09-23: 75 mL via INTRAVENOUS

## 2021-09-28 ENCOUNTER — Telehealth: Payer: Self-pay | Admitting: Neurology

## 2021-09-28 NOTE — Telephone Encounter (Signed)
Patient has an appointment tomorrow.  She just wanted to let Dr know she had a CT done and it is in her chart. ?

## 2021-09-29 ENCOUNTER — Ambulatory Visit: Payer: BC Managed Care – PPO | Admitting: Neurology

## 2021-09-29 ENCOUNTER — Encounter: Payer: Self-pay | Admitting: Neurology

## 2021-09-29 VITALS — BP 112/74 | HR 78 | Resp 18

## 2021-09-29 DIAGNOSIS — H93A1 Pulsatile tinnitus, right ear: Secondary | ICD-10-CM

## 2021-09-29 DIAGNOSIS — G43009 Migraine without aura, not intractable, without status migrainosus: Secondary | ICD-10-CM

## 2021-09-29 NOTE — Progress Notes (Signed)
Medication Samples have been provided to the patient. ? ?Drug name: Nurtec       Strength: 75 mg        Qty: 2   LOT: [id number]  Exp.Date: 07/2023 ? ?Dosing instructions: as needed ? ?The patient has been instructed regarding the correct time, dose, and frequency of taking this medication, including desired effects and most common side effects.  ? ?Venetia Night ?2:44 PM ?09/29/2021  ?

## 2021-09-29 NOTE — Progress Notes (Signed)
? ?NEUROLOGY FOLLOW UP OFFICE NOTE ? ?Yolanda Wolfe ?[id number] ? ?Assessment/Plan:  ? 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  ?Migraine prevention:  Emgality ?Migraine rescue:  Will have her try Nurtec to see if works better.  If not, she will continue [place].  Zofran for nausea ?Refer to ophthalmology for evaluation of papilledema and visual field testing.  If evaluation positive, will set up for LP and check MRV of head.  If ophthalmologic evaluation unremarkable, wouldn't do anything as she really isn't symptomatic (pulsatile tinnitus is now resolved and intermittent/infrequent anyway, no visual deficits, headaches appear to be migraine as much improved on Emgality). ?Limit use of pain relievers to no more than 2 days out of week to prevent risk of rebound or medication-overuse headache. ?Keep headache diary ?Recommend weight loss and discontinue Retin-A ?Follow up 4 months. ? ?Subjective:  ?Yolanda Wolfe is a 46 year old right-handed female with depression and anxiety who follows up for migraines. ?  ?UPDATE: ?Aimovig ineffective.  Started [employer]. ?Intensity:  severe ?Duration:  2-3 hours with Roselyn Meier.  Usually needs two doses.   ?Frequency: 7 a month ? ?Increased headaches and dizziness in December and January.  Sometimes felt off-balance.  She started experiencing right sided pulsatile tinnitus.  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  Denies visual obscurations or blurred vision but feels vision isn't as great as it used to be.  Has not had recent eye exam.    Reports weight gain over past year. ? ?Frequency of abortive medication: 7 days this month. ?Current NSAIDS/analgesics:  Excedrin ?Current triptans:  none ?Current  ergotamine:  none ?Current anti-emetic:  Zofran ODT '4mg'$  ?Current muscle relaxants:  none ?Current Antihypertensive medications:  none ?Current Antidepressant medications:  Fluoxetine '40mg'$  daily ?Current Anticonvulsant medications:  none ?Current anti-CGRP:  Julieanne Manson '100mg'$  ?Current Vitamins/Herbal/Supplements:  Deplin, melatonin ?Current Antihistamines/Decongestants:  none ?Other therapy:  none ?Hormone/birth control:  none ?Other medications:  Lorazepam '2mg'$  QHS ?  ?Caffeine:  Sweet tea.  No coffee ?Diet:  80 oz water daily.  Opto diet.  Does not skip meals.  Optavia Diet. ?Exercise:  no ?Depression:  yes; Anxiety:  yes ?Other pain:  no ?Sleep:  Poor.  Lorazepam helps her stay asleep.  Wakes up tired.  Sleep apnea ruled out.  ?  ?HISTORY:  ?Headaches since childhood.  They are severe diffuse but primarily bi-temporal pain with associated nausea, photophobia, phonophobia but no associated vomiting, visual disturbance, autonomic symptoms, numbness or weakness.  They have progressively gotten worse over the past year from every 2 days-every 2 weeks to daily.  She reports increased stress and anxiety.  They last hours to 2 days.  Previously would occur every 2 days to every 2 weeks.  No explainable trigger.  No known relieving factors.  She has been taking Excedrin almost daily.   ?  ?CT head without contrast on 06/15/2020 personally reviewed showed slight symmetric bifrontal atrophy and invagination of the CSF into the sella but no intracranial mass or other acute abnormality. ?  ?Reports tinnitus in right ear.  Audiometric testing reportedly normal. ?  ?  ?Past NSAIDS/analgesics:  Ibuprofen, Tylenol ?Past abortive triptans:  rizatriptan, sumatriptan tab ?Past abortive ergotamine:  none ?Past muscle relaxants:  none ?Past anti-emetic:  none ?  Past antihypertensive medications:  none ?Past antidepressant medications:  Amitriptyline, sertraline ?Past anticonvulsant medications:  topiramate ?Past anti-CGRP:   Aimovig '140mg'$  ?Past vitamins/Herbal/Supplements:  none ?Past antihistamines/decongestants:  none ?Other past therapies:  none ?  ?  ?Family history of headache:  No ? ?PAST MEDICAL HISTORY: ?Past Medical History:  ?Diagnosis Date  ? Anxiety   ? Blood transfusion without reported diagnosis   ? Depression   ? Frequent headaches   ? GERD (gastroesophageal reflux disease)   ? Heart murmur   ? Migraine   ? UTI (lower urinary tract infection)   ? ? ?MEDICATIONS: ?Current Outpatient Medications on File Prior to Visit  ?Medication Sig Dispense Refill  ? EMGALITY 120 MG/ML SOAJ INJECT '240MG'$  (2ML) INTO THE SKIN ONCE FOR 1 DOSE. LOADING DOSE. 1 mL 2  ? FLUoxetine (PROZAC) 20 MG capsule Take 3 capsules (60 mg total) by mouth daily. 270 capsule 1  ? LORazepam (ATIVAN) 1 MG tablet 1 po qd prn and 2 po qhs. 90 tablet 5  ? meclizine (ANTIVERT) 25 MG tablet Take 1 tablet (25 mg total) by mouth 3 (three) times daily as needed for dizziness. (Patient not taking: Reported on 06/25/2021) 30 tablet 0  ? ondansetron (ZOFRAN ODT) 4 MG disintegrating tablet Take 1 tablet (4 mg total) by mouth every 8 (eight) hours as needed for nausea or vomiting. 20 tablet 5  ? traZODone (DESYREL) 100 MG tablet Take 0.5-2 tablets (50-200 mg total) by mouth at bedtime as needed for sleep. 60 tablet 1  ? tretinoin (RETIN-A) 0.1 % cream SMARTSIG:Topical Every Evening    ? Ubrogepant (UBRELVY) 100 MG TABS Take 1 tablet by mouth as needed (May repeat after 2 hours.  Maximum 2 tablets in 24 hours.). 16 tablet 5  ? ?No current facility-administered medications on file prior to visit.  ? ? ?ALLERGIES: ?Allergies  ?Allergen Reactions  ? Wellbutrin [Bupropion] Other (See Comments)  ?  Worsened migraines  ? ? ?FAMILY HISTORY: ?Family History  ?Problem Relation Age of Onset  ? Cancer Maternal Grandmother   ?     breast  ? Heart disease Maternal Grandfather   ? Alcohol abuse Paternal Grandfather   ? Dementia Paternal Grandmother   ? Cancer Mother   ?     neuroendocrine  cancer  ? Cancer Father   ?     skin  ? Healthy Daughter   ? ? ?  ?Objective:  ?Blood pressure 112/74, pulse 78, resp. rate 18, SpO2 100 %. ?General: No acute distress.  Patient appears well-groomed.   ?Head:  Normocephalic/atraumatic ?Eyes:  Fundi examined but not visualized ?Neck: supple, no paraspinal tenderness, full range of motion ?Heart:  Regular rate and rhythm ?Neurological Exam: alert and oriented to person, place, and time.  Speech fluent and not dysarthric, language intact.  CN II-XII intact. Bulk and tone normal, muscle strength 5/5 throughout.  Sensation to light touch intact.  Deep tendon reflexes 2+ throughout, toes downgoing.  Finger to nose testing intact.  Gait normal, Romberg negative. ? ? ?Metta Clines, DO ? ?CC: Betty Martinique, MD ? ? ? ? ? ? ?

## 2021-09-29 NOTE — Patient Instructions (Signed)
Continue Emgality ?When you get a migraine, try Nurtec (once daily as needed).  If works better than Roselyn Meier, let me know ?Will send you to ophthalmology for evaluation of increased intracranial pressure ?Stop use of Retin-A cream  ?Weight loss ?Follow up 4 months. ?

## 2021-10-01 ENCOUNTER — Telehealth (HOSPITAL_COMMUNITY): Payer: Self-pay | Admitting: Pharmacy Technician

## 2021-10-01 ENCOUNTER — Other Ambulatory Visit: Payer: Self-pay

## 2021-10-01 ENCOUNTER — Telehealth: Payer: Self-pay | Admitting: Neurology

## 2021-10-01 DIAGNOSIS — G43709 Chronic migraine without aura, not intractable, without status migrainosus: Secondary | ICD-10-CM

## 2021-10-01 DIAGNOSIS — G43009 Migraine without aura, not intractable, without status migrainosus: Secondary | ICD-10-CM

## 2021-10-01 MED ORDER — NURTEC 75 MG PO TBDP: 1.0000 | ORAL_TABLET | Freq: Every day | ORAL | 1 refills | Status: DC | PRN

## 2021-10-01 NOTE — Telephone Encounter (Signed)
Patient Advocate Encounter ?  ?Received notificationthat prior authorization for Nurtec '75MG'$  dispersible tablets is required. ?  ?PA submitted on 10/01/2021 ?Key [id number] ?Status is pending ?   ? ? ? ?Lyndel Safe, CPhT ?Pharmacy Patient Advocate Specialist ?[place] Patient Advocate Team ?Direct Number: [phone]  Fax: [phone]  ?

## 2021-10-01 NOTE — Telephone Encounter (Signed)
Prescription sent in for patient sent patient My chart message to let her know Nurtec has been sent in . Sending to  Dr. Tomi Likens for [place]  ?

## 2021-10-01 NOTE — Telephone Encounter (Signed)
Patient wants to let jaffe know that nurtec worked for her. She would like a prescription sent to her pharmacy  ?[facility] [place], [place] - [address] ? ?

## 2021-10-04 ENCOUNTER — Ambulatory Visit: Payer: BC Managed Care – PPO | Admitting: Neurology

## 2021-10-04 NOTE — Telephone Encounter (Signed)
Patient Advocate Encounter ? ?Prior Authorization for Nurtec '75MG'$  dispersible tablets has been approved.   ? ?PA# [id number] ?Effective dates: 10/01/2021 through 10/02/2022 ? ?Insurance will approve only 16 tablets per month ? ? ? ?Lyndel Safe, CPhT ?Pharmacy Patient Advocate Specialist ?[place] Patient Advocate Team ?Direct Number: [phone]  Fax: [phone]  ?

## 2021-10-22 ENCOUNTER — Other Ambulatory Visit: Payer: Self-pay

## 2021-10-22 DIAGNOSIS — H93A1 Pulsatile tinnitus, right ear: Secondary | ICD-10-CM

## 2021-10-22 NOTE — Progress Notes (Signed)
Refer to ophthalmology per [facility].  ?

## 2021-10-25 IMAGING — CT CT HEAD W/O CM
3 of 4 series · 15 of 47 positions shown, 18 images · non-contrast
Comparison: None.

CLINICAL DATA: Headache and photophobia.  Nausea.

EXAM:
CT HEAD WITHOUT CONTRAST
TECHNIQUE: Contiguous axial images were obtained from the base of the skull
through the vertex without intravenous contrast.

[Series 2: head 5.0 hc40 · axial · 0.41mm/px · z∈[+136,+256]mm · 9 of 30 slices shown, 12 images]
[im 3/30  brain]
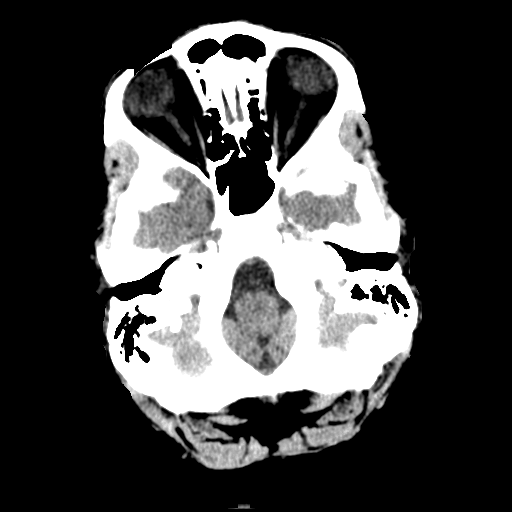
[im 3/30  bone]
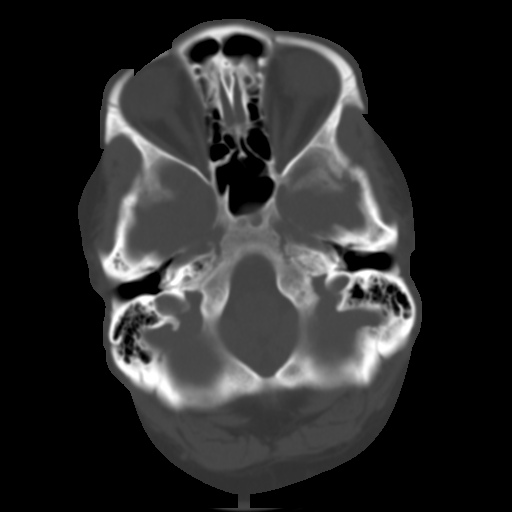
[im 7/30  brain]
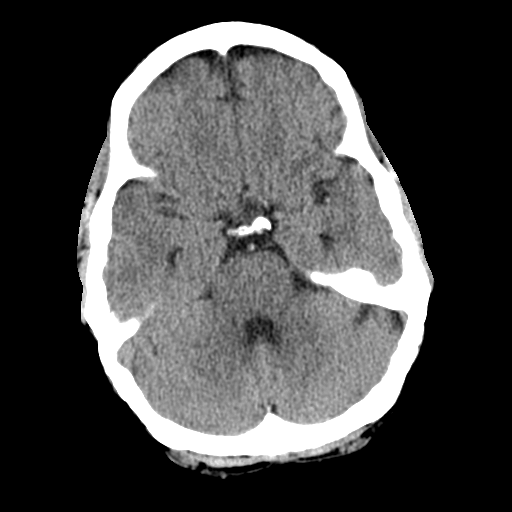
[im 9/30  brain]
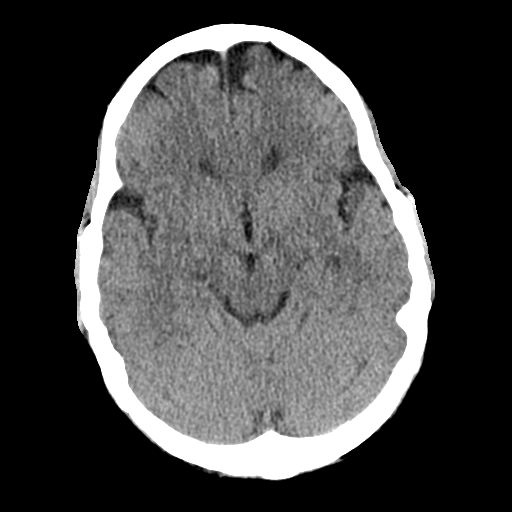
[im 13/30  brain]
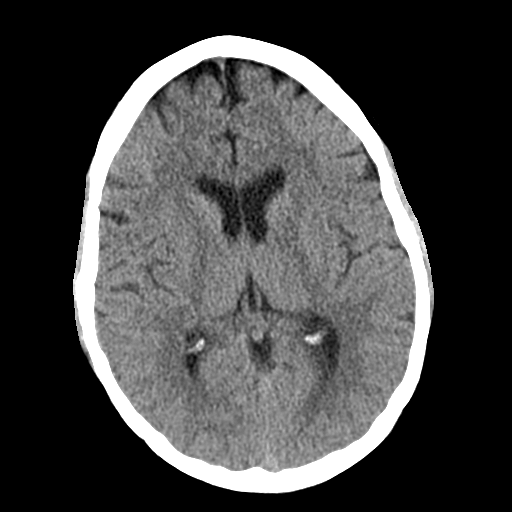
[im 15/30  brain]
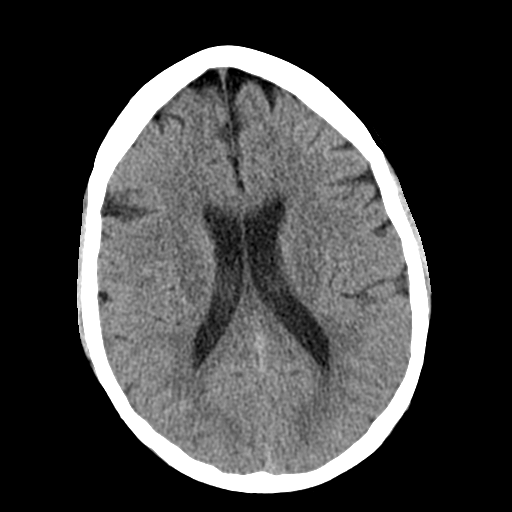
[im 15/30  bone]
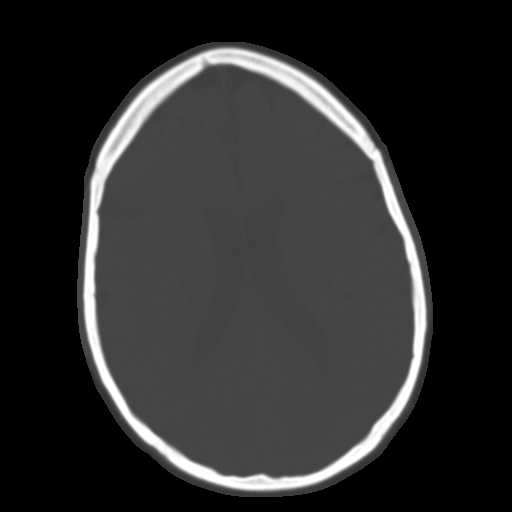
[im 17/30  brain]
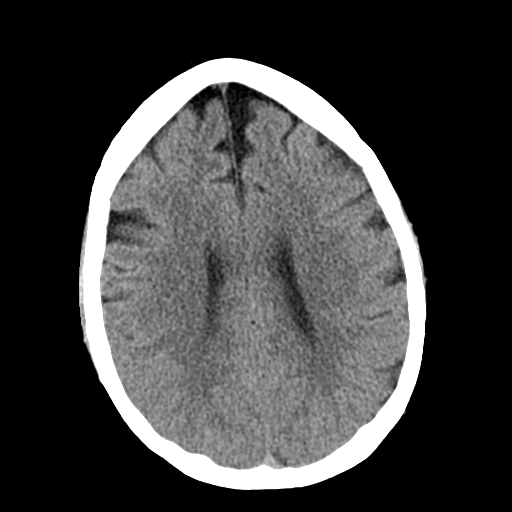
[im 21/30  brain]
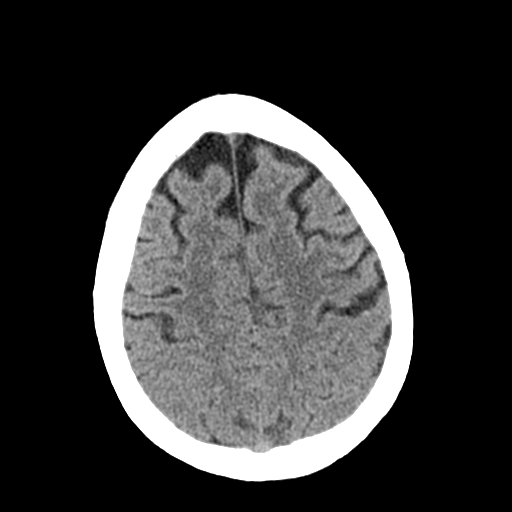
[im 23/30  brain]
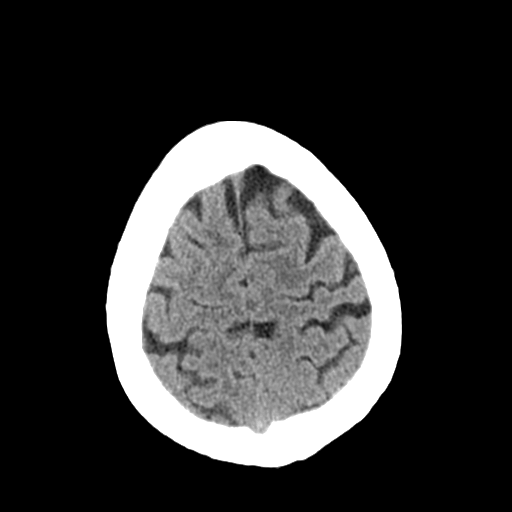
[im 27/30  brain]
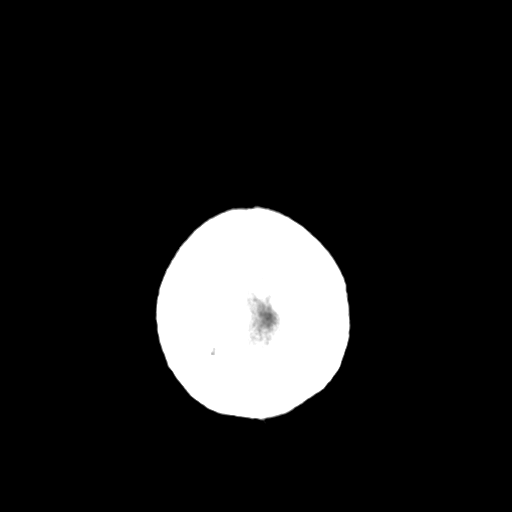
[im 27/30  bone]
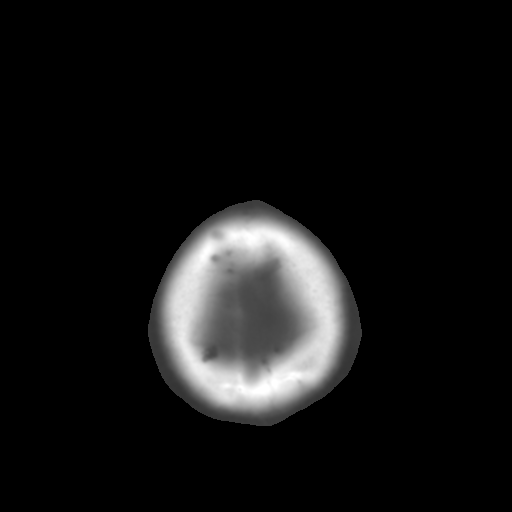

[Series 4: head 3.0 mpr cor · coronal · 0.30mm/px · 3 of 64 slices shown]
[im 22/64  brain]
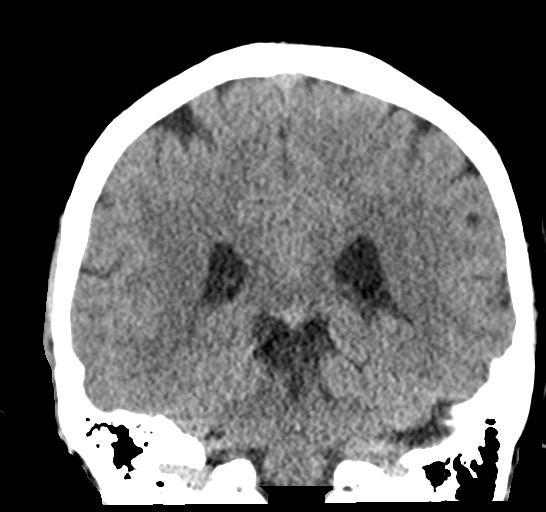
[im 29/64  brain]
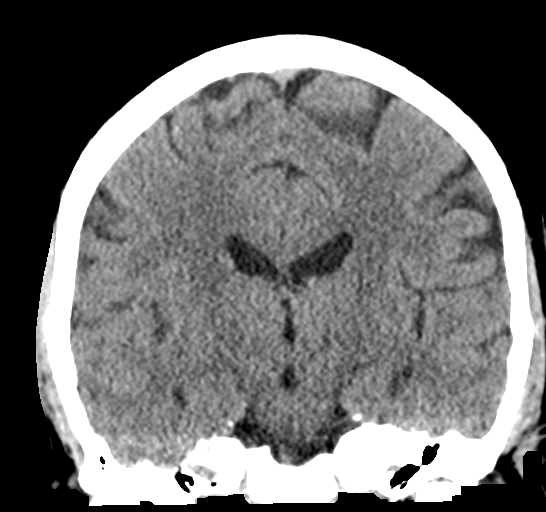
[im 36/64  brain]
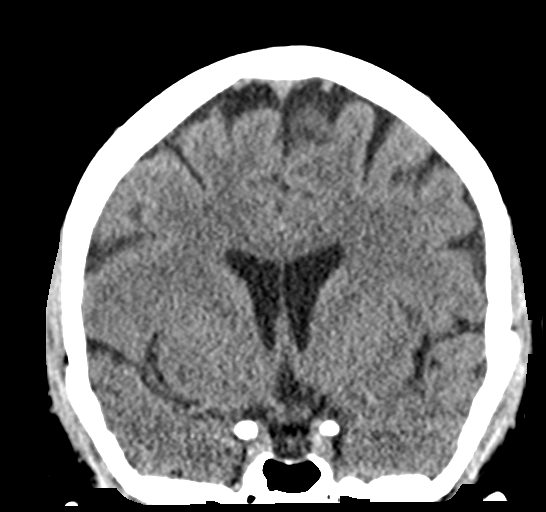

[Series 5: head 3.0 mpr sag · sagittal · 0.32mm/px · 3 of 54 slices shown]
[im 18/54  brain]
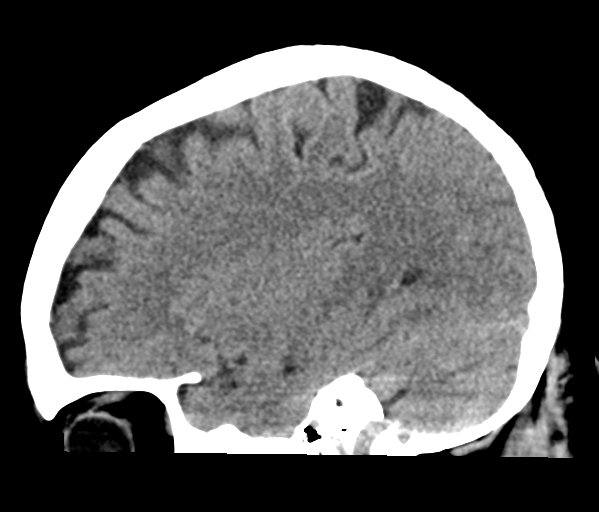
[im 27/54  brain]
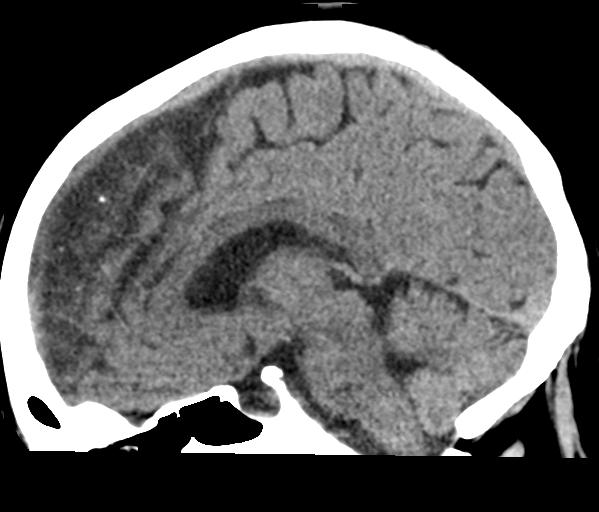
[im 36/54  brain]
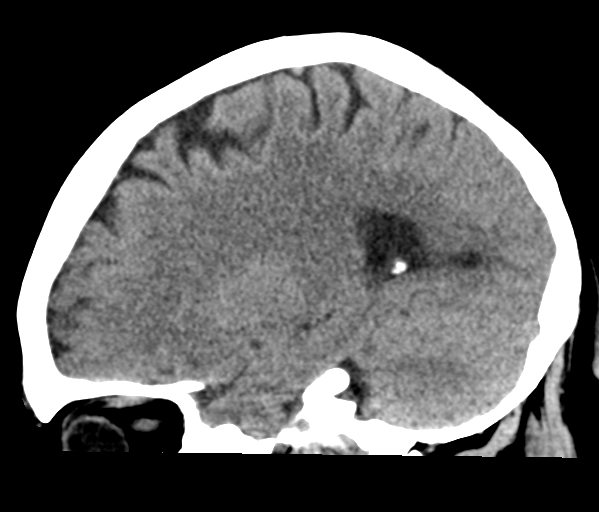

[15 of 47 positions shown; findings below may reference images not displayed]

FINDINGS: Brain: There is mild symmetric frontal lobe atrophy bilaterally.
Ventricles and sulci elsewhere appear normal. There is a degree of
invagination of CSF into the sella. There is no intracranial mass,
hemorrhage, extra-axial fluid collection, or midline shift. Brain
parenchyma appears unremarkable. No acute infarct evident.

Vascular: No hyperdense vessel. No appreciable vascular
calcification.

Skull: The bony calvarium appears intact.

Sinuses/Orbits: Visualized paranasal sinuses clear. Visualized
orbits appear symmetric bilaterally.

Other: Visualized mastoid air cells clear.
IMPRESSION: Slight symmetric frontal atrophy bilaterally. Ventricles and sulci
otherwise appear normal.

Brain parenchyma appears unremarkable without evidence of acute
infarct. No mass or hemorrhage.

There is invagination of CSF into the sella. Clinical significance
of this finding uncertain. This finding has been associated with
headache.

## 2021-11-09 ENCOUNTER — Other Ambulatory Visit: Payer: Self-pay | Admitting: Physician Assistant

## 2021-12-06 ENCOUNTER — Encounter: Payer: Self-pay | Admitting: Physician Assistant

## 2021-12-06 ENCOUNTER — Ambulatory Visit: Payer: BC Managed Care – PPO | Admitting: Physician Assistant

## 2021-12-06 DIAGNOSIS — F331 Major depressive disorder, recurrent, moderate: Secondary | ICD-10-CM | POA: Diagnosis not present

## 2021-12-06 DIAGNOSIS — E7212 Methylenetetrahydrofolate reductase deficiency: Secondary | ICD-10-CM | POA: Diagnosis not present

## 2021-12-06 DIAGNOSIS — F411 Generalized anxiety disorder: Secondary | ICD-10-CM | POA: Diagnosis not present

## 2021-12-06 DIAGNOSIS — G8929 Other chronic pain: Secondary | ICD-10-CM

## 2021-12-06 DIAGNOSIS — R519 Headache, unspecified: Secondary | ICD-10-CM

## 2021-12-06 DIAGNOSIS — F5105 Insomnia due to other mental disorder: Secondary | ICD-10-CM | POA: Diagnosis not present

## 2021-12-06 DIAGNOSIS — F99 Mental disorder, not otherwise specified: Secondary | ICD-10-CM

## 2021-12-06 MED ORDER — FLUOXETINE HCL 20 MG PO CAPS: 80.0000 mg | ORAL_CAPSULE | Freq: Every day | ORAL | 1 refills | Status: DC

## 2021-12-06 MED ORDER — LORAZEPAM 1 MG PO TABS: ORAL_TABLET | ORAL | 5 refills | Status: DC

## 2021-12-06 NOTE — Progress Notes (Signed)
[facility]  Patient ID: Yolanda Wolfe,  MRN: [id number]  PCP: Martinique, Betty G, MD  Date of Evaluation: 12/06/2021  time spent:30 minutes  Chief Complaint:  Chief Complaint   Anxiety; Depression; Insomnia; Follow-up    HISTORY/CURRENT STATUS: HPI  For routine med check.  More stress w/ her daughter going into 9th grade, she's been very emotional for the change in going to [place]. That wasn't expected so it's been hard for [facility].  But they are both making it.  States she's in a funk. We'd talked about maybe changing something at last visit. Is able to enjoy things, like watching movies w/ her husband, energy and motivation are low most of the time. It's hard to make herself do things. Gets overwhelmed over the 'bigness' of it. Lets laundry pile up. 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 Would like to stay in bed all the time if she could. Appetite is good. Weight is stable. 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  No SI/HI.   Anxiety is controlled for the most part.  She does need the Ativan daily though or else she will be panicky.  The Ativan helps her go to sleep.  We tried trazodone but it was not effective at a lower dose and at a higher dose she was really groggy.  Patient denies increased energy with decreased need for sleep, no increased talkativeness, no racing thoughts, no impulsivity or risky behaviors, no increased spending, no increased libido, no grandiosity, no increased irritability or anger, and no hallucinations.  Denies dizziness, syncope, seizures, numbness, tingling, tremor, tics, unsteady gait, slurred speech, confusion. Denies muscle or joint pain, stiffness, or dystonia. Denies unexplained weight loss, frequent infections, or sores that heal slowly.  No polyphagia, polydipsia, or polyuria. Denies visual changes or paresthesias.    Individual Medical History/ Review of  Systems: Changes? :No      Past medications for mental health diagnoses include: Amitriptylline for sleep? Celexa, Lunesta, Ativan, Zoloft, Belsomra, Temazapam Topamax for migraines, Wellbutrin caused migraines to worsen, trazodone not effective at low doses but at 100 mg she was too groggy.  Allergies: Wellbutrin [bupropion]  Current Medications:  Current Outpatient Medications:    EMGALITY 120 MG/ML SOAJ, INJECT '240MG'$  (2ML) INTO THE SKIN ONCE FOR 1 DOSE. LOADING DOSE., Disp: 1 mL, Rfl: 2   Multiple Vitamin (MULTIVITAMIN) tablet, Take 1 tablet by mouth daily., Disp: , Rfl:    ondansetron (ZOFRAN ODT) 4 MG disintegrating tablet, Take 1 tablet (4 mg total) by mouth every 8 (eight) hours as needed for nausea or vomiting., Disp: 20 tablet, Rfl: 5   Rimegepant Sulfate (NURTEC) 75 MG TBDP, Take 1 tablet by mouth daily as needed., Disp: 30 tablet, Rfl: 1   tretinoin (RETIN-A) 0.1 % cream, SMARTSIG:Topical Every Evening, Disp: , Rfl:    FLUoxetine (PROZAC) 20 MG capsule, Take 4 capsules (80 mg total) by mouth daily., Disp: 270 capsule, Rfl: 1   LORazepam (ATIVAN) 1 MG tablet, 1 po qd prn and 2 po qhs., Disp: 90 tablet, Rfl: 5   Ubrogepant (UBRELVY) 100 MG TABS, Take 1 tablet by mouth as needed (May repeat after 2 hours.  Maximum 2 tablets in 24 hours.). (Patient not taking: Reported on 12/06/2021), Disp: 16 tablet, Rfl: 5 Medication Side Effects: headache  Family Medical/ Social History: Changes?  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   MENTAL HEALTH EXAM:  There were no vitals taken for this visit.There  is no height or weight on file to calculate BMI.  General Appearance: Casual, Neat, and Obese  Eye Contact:  Good  Speech:  Clear and Coherent and Normal Rate  Volume:  Normal  Mood:  Depressed  Affect:  Depressed  Thought Process:  Goal Directed and Descriptions of Associations: Circumstantial  Orientation:  Full (Time, Place, and Person)  Thought Content: Logical   Suicidal Thoughts:  No  Homicidal  Thoughts:  No  Memory:  WNL  Judgement:  Good  Insight:  Good  Psychomotor Activity:  Normal  Concentration:  Concentration: Good and Attention Span: Good  Recall:  Good  Fund of Knowledge: Good  Language: Good  Assets:  Desire for Improvement Financial Resources/Insurance Housing Transportation  ADL's:  Intact  Cognition: WNL  Prognosis:  Good   GeneSight test results are in chart had moderately reduced folic acid conversion  DIAGNOSES:    ICD-10-CM   1. Major depressive disorder, recurrent episode, moderate (HCC)  F33.1     2. Insomnia due to other mental disorder  F51.05    F99     3. 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 Generalized anxiety disorder  F41.1     5. Chronic nonintractable headache, unspecified headache type  R51.9    G89.29       Receiving Psychotherapy: No    RECOMMENDATIONS:  PDMP was reviewed.  Last Ativan filled 11/29/2021. I provided 30 minutes of face to face time during this encounter, including time spent before and after the visit in records review, medical decision making, counseling pertinent to today's visit, and charting.  We discussed different options for depression.  We tried Wellbutrin but it caused worsening migraines so we had to stop it.  It was effective however.  We could increase the Prozac to maximum.  Or recommend augmenting with Abilify or Latuda.  Benefits, risks, side effects of those atypicals were discussed and they make her nervous but is willing to try 1 if I feel that will be most helpful.  Since we know that she is tolerating the Prozac well I recommend increasing that and rechecking in a month.  She will likely not be a full benefit of the increased dose of Prozac in 1 month but I think she will see some effect if it is going to work.  If not then I recommend Latuda.  She understands and likes this plan.  Increase Prozac 20 mg to 4 p.o. every morning.  She has plenty of the 20 mg pills  but if she responds well and stays on this dose I will send in 40 mg pills when needed. Continue Ativan 1 mg, 1 po tid.  Return in 4 weeks.  Donnal Moat, PA-C

## 2022-01-04 ENCOUNTER — Encounter: Payer: Self-pay | Admitting: Physician Assistant

## 2022-01-04 ENCOUNTER — Ambulatory Visit: Payer: BC Managed Care – PPO | Admitting: Physician Assistant

## 2022-01-04 DIAGNOSIS — F411 Generalized anxiety disorder: Secondary | ICD-10-CM | POA: Diagnosis not present

## 2022-01-04 DIAGNOSIS — F99 Mental disorder, not otherwise specified: Secondary | ICD-10-CM

## 2022-01-04 DIAGNOSIS — F3341 Major depressive disorder, recurrent, in partial remission: Secondary | ICD-10-CM | POA: Diagnosis not present

## 2022-01-04 DIAGNOSIS — F5105 Insomnia due to other mental disorder: Secondary | ICD-10-CM | POA: Diagnosis not present

## 2022-01-04 MED ORDER — FLUOXETINE HCL 40 MG PO CAPS: 80.0000 mg | ORAL_CAPSULE | Freq: Every day | ORAL | 3 refills | Status: DC

## 2022-01-04 NOTE — Progress Notes (Signed)
[facility]  Patient ID: Yolanda Wolfe,  MRN: [id number]  PCP: Martinique, Betty G, MD  Date of Evaluation: 01/04/2022  Time spent:20 minutes  Chief Complaint:  Chief Complaint   Anxiety; Depression; Insomnia; Follow-up    HISTORY/CURRENT STATUS: HPI  For routine med check.  We increased the Prozac about a month ago.  She is feeling a lot better.  More able to enjoy things and energy and motivation are better.  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  ADLs and personal hygiene are normal.  Appetite is normal and weight is stable.  Not isolating.  Not crying easily.  No suicidal or homicidal thoughts.  Anxiety is controlled.  No panic attacks but sometimes she feels overwhelmed.  Rarely takes Ativan during the day because it makes her sleepy.  She does take it every night to help her go to sleep and it is still effective.  Patient denies increased energy with decreased need for sleep, no increased talkativeness, no racing thoughts, no impulsivity or risky behaviors, no increased spending, no increased libido, no grandiosity, no increased irritability or anger, and no hallucinations.  Denies dizziness, syncope, seizures, numbness, tingling, tremor, tics, unsteady gait, slurred speech, confusion. Denies muscle or joint pain, stiffness, or dystonia.   Individual Medical History/ Review of Systems: Changes? :No      Past medications for mental health diagnoses include: Amitriptylline for sleep? Celexa, Lunesta, Ativan, Zoloft, Belsomra, Temazapam Topamax for migraines, Wellbutrin caused migraines to worsen, trazodone not effective at low doses but at 100 mg she was too groggy.  Allergies: Wellbutrin [bupropion]  Current Medications:  Current Outpatient  Medications:    EMGALITY 120 MG/ML SOAJ, INJECT '240MG'$  (2ML) INTO THE SKIN ONCE FOR 1 DOSE. LOADING DOSE., Disp: 1 mL, Rfl: 2   FLUoxetine (PROZAC) 40 MG capsule, Take 2 capsules (80 mg total) by mouth daily., Disp: 180 capsule, Rfl: 3   LORazepam (ATIVAN) 1 MG tablet, 1 po qd prn and 2 po qhs., Disp: 90 tablet, Rfl: 5   Multiple Vitamin (MULTIVITAMIN) tablet, Take 1 tablet by mouth daily., Disp: , Rfl:    ondansetron (ZOFRAN ODT) 4 MG disintegrating tablet, Take 1 tablet (4 mg total) by mouth every 8 (eight) hours as needed for nausea or vomiting., Disp: 20 tablet, Rfl: 5   Rimegepant Sulfate (NURTEC) 75 MG TBDP, Take 1 tablet by mouth daily as needed., Disp: 30 tablet, Rfl: 1   tretinoin (RETIN-A) 0.1 % cream, SMARTSIG:Topical Every Evening, Disp: , Rfl:    Ubrogepant (UBRELVY) 100 MG TABS, Take 1 tablet by mouth as needed (May repeat after 2 hours.  Maximum 2 tablets in 24 hours.)., Disp: 16 tablet, Rfl: 5 Medication Side Effects: headache  Family Medical/ Social History: Changes?  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   MENTAL HEALTH EXAM:  There were no vitals taken for this visit.There is no height or weight on file to calculate BMI.  General Appearance: Casual and Well Groomed  Eye Contact:  Good  Speech:  Clear and Coherent and Normal Rate  Volume:  Normal  Mood:  Euthymic  Affect:  Congruent  Thought Process:  Goal Directed and Descriptions of Associations: Circumstantial  Orientation:  Full (Time, Place, and Person)  Thought Content: Logical   Suicidal  Thoughts:  No  Homicidal Thoughts:  No  Memory:  WNL  Judgement:  Good  Insight:  Good  Psychomotor Activity:  Normal  Concentration:  Concentration: Good and Attention Span: Good  Recall:  Good  Fund of Knowledge: Good  Language: Good  Assets:  Desire for Improvement Financial Resources/Insurance Housing Transportation  ADL's:  Intact  Cognition: WNL  Prognosis:  Good   GeneSight test results are in chart had moderately reduced  folic acid conversion  DIAGNOSES:    ICD-10-CM   1. Depression, major, recurrent, in partial remission ([place])  F33.41     2. Insomnia due to other mental disorder  F51.05    F99     3. Generalized anxiety disorder  F41.1       Receiving Psychotherapy: No   RECOMMENDATIONS:  PDMP was reviewed.  Ativan filled 12/26/2021. I provided 20 minutes of face to face time during this encounter, including time spent before and after the visit in records review, medical decision making, counseling pertinent to today's visit, and charting.   I am glad to see her doing well!  No changes in meds are necessary.  Continue Prozac 80 mg daily. Continue Ativan 1 mg, 1 po tid as needed. Return in 3 months.  Donnal Moat, PA-C

## 2022-01-12 ENCOUNTER — Telehealth: Payer: Self-pay | Admitting: Neurology

## 2022-01-12 DIAGNOSIS — H4710 Unspecified papilledema: Secondary | ICD-10-CM

## 2022-01-12 NOTE — Telephone Encounter (Signed)
Patient advised of Order added for LP.

## 2022-01-12 NOTE — Telephone Encounter (Signed)
Patient returned call to Sheena. 

## 2022-01-12 NOTE — Addendum Note (Signed)
Addended by: Venetia Night on: 01/12/2022 02:48 PM   Modules accepted: Orders

## 2022-01-12 NOTE — Telephone Encounter (Signed)
Received note from ophthalmology - questionable papilledema.  As discussed at last visit, would like to schedule for spinal tap to test opening pressure (as well as CSF cell count, protein, glucose, culture).  She had CT head in April, so she should be good.

## 2022-01-12 NOTE — Telephone Encounter (Signed)
Tried calling patient, no answer,. LMOVM for patient to call the office back.

## 2022-01-19 ENCOUNTER — Ambulatory Visit
Admission: RE | Admit: 2022-01-19 | Discharge: 2022-01-19 | Disposition: A | Discharge status: Home / Self Care | Payer: BC Managed Care – PPO | Source: Ambulatory Visit | Attending: Neurology | Admitting: Neurology

## 2022-01-19 VITALS — BP 130/71 | HR 63

## 2022-01-19 DIAGNOSIS — R839 Unspecified abnormal finding in cerebrospinal fluid: Secondary | ICD-10-CM | POA: Insufficient documentation

## 2022-01-19 DIAGNOSIS — H4710 Unspecified papilledema: Secondary | ICD-10-CM

## 2022-01-19 DIAGNOSIS — R519 Headache, unspecified: Secondary | ICD-10-CM

## 2022-01-19 NOTE — Discharge Instructions (Signed)

## 2022-01-19 NOTE — Progress Notes (Signed)
Blood drawn from pts RAC for labs to be sent off with LP lab work. 1 successful attempt. Pt tolerated well. Gauze and tape applied after.

## 2022-01-20 ENCOUNTER — Telehealth: Payer: Self-pay

## 2022-01-20 DIAGNOSIS — H93A1 Pulsatile tinnitus, right ear: Secondary | ICD-10-CM

## 2022-01-20 DIAGNOSIS — H4710 Unspecified papilledema: Secondary | ICD-10-CM

## 2022-01-20 NOTE — Telephone Encounter (Signed)
Tried calling patient, no answer, LMOVM to call the office back.

## 2022-01-20 NOTE — Telephone Encounter (Signed)
-----   Message from Pieter Partridge, DO sent at 01/20/2022  7:35 AM EDT ----- Spinal fluid pressure is borderline elevated.  To see if the pulsatile tinnitus sound in the ear may be related to increased spinal fluid pressure, I would like to start a small dose of acetazolamide '250mg'$  twice daily.  It is used to lower spinal fluid pressure.  I would then like for her to have a repeat eye exam at [facility] in 8 weeks.  She should note that the medication may cause numbness and tingling, so not be alarmed if she experiences this.  To complete workup of increased spinal fluid pressure, I would like to check MRV of head with contrast to look for findings of the veins in the head that may correlate with increased pressure.

## 2022-01-21 ENCOUNTER — Telehealth: Payer: Self-pay

## 2022-01-21 MED ORDER — ACETAZOLAMIDE 250 MG PO TABS
250.0000 mg | ORAL_TABLET | Freq: Two times a day (BID) | ORAL | 0 refills | Status: DC
Start: 2022-01-21 — End: 2022-02-17

## 2022-01-21 NOTE — Telephone Encounter (Signed)
Patient called with concerns after her lumbar puncture on Wednesday 01/19/22. Pt. Was complaining of "dull headaches that come and go." RN Bo Teicher explained to the patient that this was normal after cerebral spinal fluid is removed from the body to create a normal pressure and that her body was getting used to the "new" normal. The patient verbalized understanding and was told to call back if she had anymore concerns.

## 2022-01-21 NOTE — Telephone Encounter (Signed)
Patient advised of Dr.Jaffe below.  Acetazolamide 250 mg BID sent to the pharmacy. MRV ordered.

## 2022-01-21 NOTE — Telephone Encounter (Signed)
Patient called back after the office had closed.

## 2022-01-26 LAB — CSF CELL COUNT WITH DIFFERENTIAL
RBC Count, CSF: 1 cells/uL — ABNORMAL HIGH
TOTAL NUCLEATED CELL: 0 cells/uL (ref 0–5)

## 2022-01-26 LAB — CNS IGG SYNTHESIS RATE, CSF+BLOOD
Albumin Serum: 3.9 g/dL (ref 3.6–5.1)
Albumin, CSF: 11.4 mg/dL (ref 8.0–42.0)
CNS-IgG Synthesis Rate: -4.3 mg/24 h (ref <=3.3)
IgG (Immunoglobin G), Serum: 1080 mg/dL (ref 600–1640)
IgG Total CSF: 1.4 mg/dL (ref 0.8–7.7)
IgG-Index: 0.44 (ref <0.70)

## 2022-01-26 LAB — CSF CULTURE W GRAM STAIN
MICRO NUMBER:: 13726287
Result:: NO GROWTH
SPECIMEN QUALITY:: ADEQUATE

## 2022-01-26 LAB — GLUCOSE, CSF: Glucose, CSF: 67 mg/dL (ref 40–80)

## 2022-01-26 LAB — PROTEIN, CSF: Total Protein, CSF: 24 mg/dL (ref 15–45)

## 2022-01-26 LAB — MYELIN BASIC PROTEIN, CSF: Myelin Basic Protein: <2 mcg/L (ref <=4.0)

## 2022-01-30 ENCOUNTER — Other Ambulatory Visit: Payer: Self-pay | Admitting: Neurology

## 2022-01-30 DIAGNOSIS — G43709 Chronic migraine without aura, not intractable, without status migrainosus: Secondary | ICD-10-CM

## 2022-01-30 DIAGNOSIS — G43009 Migraine without aura, not intractable, without status migrainosus: Secondary | ICD-10-CM

## 2022-02-04 ENCOUNTER — Ambulatory Visit
Admission: RE | Admit: 2022-02-04 | Discharge: 2022-02-04 | Disposition: A | Discharge status: Home / Self Care | Payer: BC Managed Care – PPO | Source: Ambulatory Visit | Attending: Neurology | Admitting: Neurology

## 2022-02-04 DIAGNOSIS — H93A1 Pulsatile tinnitus, right ear: Secondary | ICD-10-CM

## 2022-02-04 DIAGNOSIS — H4710 Unspecified papilledema: Secondary | ICD-10-CM

## 2022-02-10 ENCOUNTER — Telehealth: Payer: Self-pay | Admitting: Neurology

## 2022-02-10 NOTE — Telephone Encounter (Signed)
Spoke with patient.

## 2022-02-10 NOTE — Telephone Encounter (Signed)
Patient returned call for lab results.  

## 2022-02-14 ENCOUNTER — Encounter: Payer: Self-pay | Admitting: Physician Assistant

## 2022-02-14 ENCOUNTER — Ambulatory Visit: Payer: BC Managed Care – PPO | Admitting: Physician Assistant

## 2022-02-14 DIAGNOSIS — F411 Generalized anxiety disorder: Secondary | ICD-10-CM

## 2022-02-14 DIAGNOSIS — R519 Headache, unspecified: Secondary | ICD-10-CM

## 2022-02-14 DIAGNOSIS — G4700 Insomnia, unspecified: Secondary | ICD-10-CM | POA: Diagnosis not present

## 2022-02-14 DIAGNOSIS — F331 Major depressive disorder, recurrent, moderate: Secondary | ICD-10-CM | POA: Diagnosis not present

## 2022-02-14 DIAGNOSIS — G8929 Other chronic pain: Secondary | ICD-10-CM

## 2022-02-14 MED ORDER — LURASIDONE HCL 20 MG PO TABS
20.0000 mg | ORAL_TABLET | Freq: Every day | ORAL | 1 refills | Status: DC
Start: 2022-02-14 — End: 2022-04-06

## 2022-02-14 NOTE — Progress Notes (Signed)
[facility]  Patient ID: Yolanda Wolfe,  MRN: [id number]  PCP: Martinique, Betty G, MD  Date of Evaluation: 02/14/2022  Time spent:30 minutes  Chief Complaint:  Chief Complaint   Depression; Anxiety     HISTORY/CURRENT STATUS: HPI  For routine med check.  Around 10 days ago, had a weekend where she was really depressed, stayed in bed the whole weekend, no motivation, no energy, had thoughts of "I wish all this would go away" but no plans to hurt herself. 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  Appetite is decreased.  The only thing she has energy for is to ride that horse but not even daily, and work.  ADLs are limited to the absolute have to chores.  Personal hygiene is normal but only because she has to shower to go to work and after she rides the horse she needs a shower.  If she did not have to, she would skip showering.  No suicidal or homicidal thoughts now.  Anxiety is controlled.  She usually only takes the Ativan at night to help her sleep.  She has trouble relaxing if she does not take it.  No panic attacks.  Of importance is that she had an LP a few weeks ago and was found to have increased cerebrospinal fluid pressure.  She will have an appointment with Dr. Tomi Likens within a week or so to discuss this.  She continues to have headaches, no known trigger when she has them.  She is a bit nervous about this and what the treatment will be going forward, but she is also happy that she is getting a thorough workup and will have a plan to help with the headaches.  Patient denies increased energy with decreased need for sleep, increased talkativeness, racing thoughts, impulsivity or risky behaviors, increased spending, increased libido, grandiosity, increased irritability or anger, paranoia, and no  hallucinations.  Denies dizziness, syncope, seizures, numbness, tingling, tremor, tics, unsteady gait, slurred speech, confusion. Denies muscle or joint pain, stiffness, or dystonia.   Individual Medical History/ Review of Systems: Changes? :Yes    had LP d/t H/A, had increased pressure, started on diamox. MRI showed no clots or anything like that, but confirmed increase pressure.  See records on chart.  Past medications for mental health diagnoses include: Amitriptylline for sleep? Celexa, Lunesta, Ativan, Zoloft, Belsomra, Temazapam Topamax for migraines, Wellbutrin caused migraines to worsen, trazodone not effective at low doses but at 100 mg she was too groggy.  Allergies: Wellbutrin [bupropion]  Current Medications:  Current Outpatient Medications:    acetaZOLAMIDE (DIAMOX) 250 MG tablet, Take 1 tablet (250 mg total) by mouth 2 (two) times daily., Disp: 60 tablet, Rfl: 0   EMGALITY 120 MG/ML SOAJ, INJECT '240MG'$  (2ML) INTO THE SKIN ONCE FOR 1 DOSE. LOADING DOSE., Disp: 1 mL, Rfl: 2   FLUoxetine (PROZAC) 40 MG capsule, Take 2 capsules (80 mg total) by mouth daily., Disp: 180 capsule, Rfl: 3   LORazepam (ATIVAN) 1 MG tablet, 1 po qd prn and 2 po qhs., Disp: 90 tablet, Rfl: 5   lurasidone (LATUDA) 20 MG TABS tablet, Take 1 tablet (20 mg total) by mouth daily with supper., Disp: 30 tablet, Rfl: 1   Multiple Vitamin (MULTIVITAMIN) tablet, Take 1 tablet by mouth daily., Disp: , Rfl:    NURTEC 75 MG TBDP, TAKE  1 TABLET BY MOUTH EVERY DAY AS NEEDED, Disp: 16 tablet, Rfl: 0   ondansetron (ZOFRAN ODT) 4 MG disintegrating tablet, Take 1 tablet (4 mg total) by mouth every 8 (eight) hours as needed for nausea or vomiting., Disp: 20 tablet, Rfl: 5   tretinoin (RETIN-A) 0.1 % cream, SMARTSIG:Topical Every Evening, Disp: , Rfl:    Ubrogepant (UBRELVY) 100 MG TABS, Take 1 tablet by mouth as needed (May repeat after 2 hours.  Maximum 2 tablets in 24 hours.). (Patient not taking: Reported on 02/14/2022), Disp:  16 tablet, Rfl: 5 Medication Side Effects: headache possibly from Wellbutrin, now or not sure if it was coincidental.  Family Medical/ Social History: Changes?  See HPI  MENTAL HEALTH EXAM:  There were no vitals taken for this visit.There is no height or weight on file to calculate BMI.  General Appearance: Casual and Well Groomed  Eye Contact:  Good  Speech:  Clear and Coherent and Normal Rate  Volume:  Normal  Mood:  Depressed  Affect:  Congruent  Thought Process:  Goal Directed and Descriptions of Associations: Circumstantial  Orientation:  Full (Time, Place, and Person)  Thought Content: Logical   Suicidal Thoughts:  No  Homicidal Thoughts:  No  Memory:  WNL  Judgement:  Good  Insight:  Good  Psychomotor Activity:  Normal  Concentration:  Concentration: Good and Attention Span: Good  Recall:  Good  Fund of Knowledge: Good  Language: Good  Assets:  Desire for Improvement Financial Resources/Insurance Housing Transportation  ADL's:  Intact  Cognition: WNL  Prognosis:  Good   GeneSight test results are in chart had moderately reduced folic acid conversion  DIAGNOSES:    ICD-10-CM   1. Major depressive disorder, recurrent episode, moderate ([facility])  F33.1     2. Generalized anxiety disorder  F41.1     3. Insomnia, unspecified type  G47.00     4. Chronic nonintractable headache, unspecified headache type  R51.9    G89.29      Receiving Psychotherapy: No   RECOMMENDATIONS:  PDMP was reviewed.  Ativan filled 12/26/2021. I provided 30 minutes of face to face time during this encounter, including time spent before and after the visit in records review, medical decision making, counseling pertinent to today's visit, and charting.   I am sorry to hear about this new diagnosis but I am glad she is under Dr. Georgie Chard care and having a thorough workup.  She will see him in a few weeks for a treatment plan.  We discussed her different options for anhedonia, decreased energy  and motivation with increased fatigue.  I brought up the Wellbutrin again, she had severe headaches while taking it, it could have been coincidental and we may be okay to prescribe it in the future, I would want to discuss with Dr. Tomi Likens first.  However neither of [place] want to try it right now because of undergoing testing.  Discussed some of the atypical antipsychotics including Abilify, Latuda, Rexulti, and Vraylar used as adjunctive therapy for depression.  Benefits, risks and side effects were discussed and she would like to try 1.  I recommend Latuda because it does not typically cause a lot of weight gain.  Continue Prozac 80 mg daily. Continue Ativan 1 mg, 1 po tid as needed. Start Latuda 20 mg, 1 p.o. q. evening with at least 350 cal. Return in 4 weeks.  Donnal Moat, PA-C

## 2022-02-16 ENCOUNTER — Other Ambulatory Visit: Payer: Self-pay | Admitting: Neurology

## 2022-02-17 ENCOUNTER — Other Ambulatory Visit: Payer: Self-pay

## 2022-02-17 MED ORDER — ACETAZOLAMIDE 250 MG PO TABS: 250.0000 mg | ORAL_TABLET | Freq: Two times a day (BID) | ORAL | 0 refills | Status: DC

## 2022-02-18 ENCOUNTER — Telehealth: Payer: Self-pay

## 2022-02-18 NOTE — Telephone Encounter (Signed)
Prior Authorization submitted and approved for LURASIDONE effective 02/17/2022-02/17/2025 with [facility]

## 2022-02-21 ENCOUNTER — Other Ambulatory Visit: Payer: Self-pay | Admitting: Neurology

## 2022-02-22 NOTE — Telephone Encounter (Signed)
No further refills until seen by Dr Tomi Likens

## 2022-02-25 ENCOUNTER — Encounter: Payer: Self-pay | Admitting: Family Medicine

## 2022-02-25 ENCOUNTER — Telehealth (INDEPENDENT_AMBULATORY_CARE_PROVIDER_SITE_OTHER): Payer: BC Managed Care – PPO | Admitting: Family Medicine

## 2022-02-25 VITALS — Temp 98.4°F | Ht 64.0 in | Wt 175.0 lb

## 2022-02-25 DIAGNOSIS — U071 COVID-19: Secondary | ICD-10-CM

## 2022-02-25 NOTE — Progress Notes (Signed)
Patient ID: Yolanda Wolfe, female   DOB: 1975/12/06, 46 y.o.   MRN: [id number]   Virtual Visit via Video Note  I connected with Yolanda Wolfe on 02/25/22 at  2:15 PM EDT by a video enabled telemedicine application and verified that I am speaking with the correct person using two identifiers.  Location patient: home Location provider:work or home office Persons participating in the virtual visit: patient, provider  I discussed the limitations of evaluation and management by telemedicine and the availability of in person appointments. The patient expressed understanding and agreed to proceed.   HPI: Patient has COVID infection.  Her 34 year old daughter was diagnosed recently.  Yolanda Wolfe has relatively mild symptoms.  She has had some congestion and headache.  Cough relatively mild.  No fever.  This is the first time she has had COVID.  Denies any nausea, vomiting, or diarrhea.  No chronic lung problems.  She does have history of migraine headaches.  Was recently started on Latuda  Past Medical History:  Diagnosis Date   Anxiety    Blood transfusion without reported diagnosis    Depression    Frequent headaches    GERD (gastroesophageal reflux disease)    Heart murmur    Migraine    UTI (lower urinary tract infection)    Past Surgical History:  Procedure Laterality Date   ABDOMINAL HYSTERECTOMY  2009   Emergency right after C-Section   CESAREAN SECTION      reports that she has never smoked. She has never used smokeless tobacco. She reports current alcohol use of about 1.0 standard drink of alcohol per week. She reports that she does not use drugs. family history includes Alcohol abuse in her paternal grandfather; Cancer in her father, maternal grandmother, and mother; Dementia in her paternal grandmother; Healthy in her daughter; Heart disease in her maternal grandfather. Allergies  Allergen Reactions   Wellbutrin [Bupropion] Other (See Comments)    Worsened  migraines      ROS: See pertinent positives and negatives per HPI.  Past Medical History:  Diagnosis Date   Anxiety    Blood transfusion without reported diagnosis    Depression    Frequent headaches    GERD (gastroesophageal reflux disease)    Heart murmur    Migraine    UTI (lower urinary tract infection)     Past Surgical History:  Procedure Laterality Date   ABDOMINAL HYSTERECTOMY  2009   Emergency right after C-Section   CESAREAN SECTION      Family History  Problem Relation Age of Onset   Cancer Maternal Grandmother        breast   Heart disease Maternal Grandfather    Alcohol abuse Paternal Grandfather    Dementia Paternal Grandmother    Cancer Mother        neuroendocrine cancer   Cancer Father        skin   Healthy Daughter     SOCIAL HX: Non-smoker   Current Outpatient Medications:    acetaZOLAMIDE (DIAMOX) 250 MG tablet, TAKE 1 TABLET BY MOUTH TWICE A DAY, Disp: 60 tablet, Rfl: 0   acetaZOLAMIDE (DIAMOX) 250 MG tablet, Take 1 tablet (250 mg total) by mouth 2 (two) times daily., Disp: 60 tablet, Rfl: 0   FLUoxetine (PROZAC) 40 MG capsule, Take 2 capsules (80 mg total) by mouth daily., Disp: 180 capsule, Rfl: 3   LORazepam (ATIVAN) 1 MG tablet, 1 po qd prn and 2 po qhs., Disp: 90 tablet, Rfl: 5  lurasidone (LATUDA) 20 MG TABS tablet, Take 1 tablet (20 mg total) by mouth daily with supper., Disp: 30 tablet, Rfl: 1   NURTEC 75 MG TBDP, TAKE 1 TABLET BY MOUTH EVERY DAY AS NEEDED, Disp: 16 tablet, Rfl: 0   tretinoin (RETIN-A) 0.1 % cream, SMARTSIG:Topical Every Evening, Disp: , Rfl:    Ubrogepant (UBRELVY) 100 MG TABS, Take 1 tablet by mouth as needed (May repeat after 2 hours.  Maximum 2 tablets in 24 hours.)., Disp: 16 tablet, Rfl: 5  EXAM:  VITALS per patient if applicable:  GENERAL: alert, oriented, appears well and in no acute distress  HEENT: atraumatic, conjunttiva clear, no obvious abnormalities on inspection of external nose and ears  NECK:  normal movements of the head and neck  LUNGS: on inspection no signs of respiratory distress, breathing rate appears normal, no obvious gross SOB, gasping or wheezing  CV: no obvious cyanosis  MS: moves all visible extremities without noticeable abnormality  PSYCH/NEURO: pleasant and cooperative, no obvious depression or anxiety, speech and thought processing grossly intact  ASSESSMENT AND PLAN:  Discussed the following assessment and plan:  COVID-relatively mild symptoms currently.  She is concerned about potential progression of symptoms.  She is interested in considering antiviral therapy.  Would avoid Paxlovid with potential QT prolongation in combination with Latuda.  Discussed molnupiravir 4 capsules twice daily for 5 days.  Plenty fluids and rest.  She is aware of isolation precautions. Follow-up for any persistent or worsening symptoms.     I discussed the assessment and treatment plan with the patient. The patient was provided an opportunity to ask questions and all were answered. The patient agreed with the plan and demonstrated an understanding of the instructions.   The patient was advised to call back or seek an in-person evaluation if the symptoms worsen or if the condition fails to improve as anticipated.     Carolann Littler, MD

## 2022-02-28 ENCOUNTER — Telehealth: Payer: Self-pay | Admitting: Family Medicine

## 2022-02-28 MED ORDER — MOLNUPIRAVIR EUA 200MG CAPSULE: ORAL_CAPSULE | ORAL | 0 refills | Status: DC

## 2022-02-28 NOTE — Telephone Encounter (Signed)
Pt call and stated she had a appt with dr.Burchette last week and he was to send her the [facility] for covid to  [facility] [place] Phone:  [phone]  Fax:  [phone]    She stated pharmacy didn't have it also pt want a call back when sent.

## 2022-02-28 NOTE — Telephone Encounter (Signed)
RX sent and patient aware

## 2022-03-08 ENCOUNTER — Ambulatory Visit: Payer: BC Managed Care – PPO | Admitting: Neurology

## 2022-03-08 ENCOUNTER — Telehealth: Payer: Self-pay | Admitting: Family Medicine

## 2022-03-08 NOTE — Telephone Encounter (Signed)
Patient saw Dr Elease Hashimoto 02/25/22 after testing positive for covid. Needs a note to excuse her from work.

## 2022-03-08 NOTE — Telephone Encounter (Signed)
Letter composed and patient aware

## 2022-03-13 ENCOUNTER — Other Ambulatory Visit: Payer: Self-pay | Admitting: Neurology

## 2022-03-14 ENCOUNTER — Encounter: Payer: Self-pay | Admitting: Physician Assistant

## 2022-03-14 ENCOUNTER — Ambulatory Visit (INDEPENDENT_AMBULATORY_CARE_PROVIDER_SITE_OTHER): Payer: BC Managed Care – PPO | Admitting: Physician Assistant

## 2022-03-14 DIAGNOSIS — R5383 Other fatigue: Secondary | ICD-10-CM | POA: Diagnosis not present

## 2022-03-14 DIAGNOSIS — F411 Generalized anxiety disorder: Secondary | ICD-10-CM | POA: Diagnosis not present

## 2022-03-14 DIAGNOSIS — F331 Major depressive disorder, recurrent, moderate: Secondary | ICD-10-CM | POA: Diagnosis not present

## 2022-03-14 DIAGNOSIS — R5381 Other malaise: Secondary | ICD-10-CM

## 2022-03-14 NOTE — Progress Notes (Signed)
[facility]  Patient ID: Yolanda Wolfe,  MRN: [id number]  PCP: Martinique, Betty G, MD  Date of Evaluation: 03/14/2022  Time spent:20 minutes  Chief Complaint:  Chief Complaint   Anxiety; Depression; Follow-up    HISTORY/CURRENT STATUS: HPI  For routine med check.  Had covid about 3 weeks ago.  Even before that she was having a lot of fatigue.  No energy to do much of anything and was sleeping a lot.  That has continued and if anything has gotten worse.  She was out of work for 1 week due to [place].  She is a Automotive engineer.  Right now she goes home and goes to bed.  She does not feel rested.  Does not have a lot of motivation at all.  That was the reason we added Latuda at the last visit.  It is impossible to say if it has helped or not because of the viral syndrome.  Not doing anything she enjoys.  Does not feel like it physically.  Personal hygiene is normal.  ADLs are at the minimum.  Appetite is normal.  She has gained 8 pounds in the past month.  Not crying easily.  Still having anxiety, not so much panic attacks as just feeling overwhelmed.  Ativan is helpful.  No suicidal or homicidal thoughts.  Patient denies increased energy with decreased need for sleep, increased talkativeness, racing thoughts, impulsivity or risky behaviors, increased spending, increased libido, grandiosity, increased irritability or anger, paranoia, or hallucinations.  Denies dizziness, syncope, seizures, numbness, tingling, tremor, tics, unsteady gait, slurred speech, confusion. Denies muscle or joint pain, stiffness, or dystonia.   Individual Medical History/ Review of Systems: Changes? :Yes    had covid since our LOV Unfortunately she was not able to see the neurologist for follow-up of increased intracranial pressure because she had COVID at the time of that appointment.  She was to go over all of the test results and formulate a plan.  Past medications for mental health diagnoses  include: Amitriptylline for sleep? Celexa, Lunesta, Ativan, Zoloft, Belsomra, Temazapam Topamax for migraines, Wellbutrin caused migraines to worsen, trazodone not effective at low doses but at 100 mg she was too groggy.  Allergies: Wellbutrin [bupropion]  Current Medications:  Current Outpatient Medications:    acetaZOLAMIDE (DIAMOX) 250 MG tablet, TAKE 1 TABLET BY MOUTH TWICE A DAY, Disp: 60 tablet, Rfl: 0   acetaZOLAMIDE (DIAMOX) 250 MG tablet, TAKE 1 TABLET BY MOUTH TWICE A DAY, Disp: 60 tablet, Rfl: 0   FLUoxetine (PROZAC) 40 MG capsule, Take 2 capsules (80 mg total) by mouth daily., Disp: 180 capsule, Rfl: 3   Galcanezumab-gnlm (EMGALITY [place]), Inject into the skin., Disp: , Rfl:    LORazepam (ATIVAN) 1 MG tablet, 1 po qd prn and 2 po qhs., Disp: 90 tablet, Rfl: 5   lurasidone (LATUDA) 20 MG TABS tablet, Take 1 tablet (20 mg total) by mouth daily with supper., Disp: 30 tablet, Rfl: 1   molnupiravir EUA (LAGEVRIO) 200 mg CAPS capsule, Take 4 capsules by mouth twice daily for 5 days, Disp: 40 capsule, Rfl: 0   NURTEC 75 MG TBDP, TAKE 1 TABLET BY MOUTH EVERY DAY AS NEEDED, Disp: 16 tablet, Rfl: 0   tretinoin (RETIN-A) 0.1 % cream, SMARTSIG:Topical Every Evening, Disp: , Rfl:    Ubrogepant (UBRELVY) 100 MG TABS, Take 1 tablet by mouth as needed (May repeat after 2 hours.  Maximum 2 tablets in 24 hours.). (Patient not taking: Reported on 03/14/2022), Disp: 16  tablet, Rfl: 5 Medication Side Effects: headache possibly from Wellbutrin, now or not sure if it was coincidental.  Family Medical/ Social History: Changes?  See HPI  MENTAL HEALTH EXAM:  There were no vitals taken for this visit.There is no height or weight on file to calculate BMI.  General Appearance: Casual and Well Groomed  Eye Contact:  Good  Speech:  Clear and Coherent and Normal Rate  Volume:  Normal  Mood:  Depressed  Affect:  Depressed  Thought Process:  Goal Directed and Descriptions of Associations: Circumstantial   Orientation:  Full (Time, Place, and Person)  Thought Content: Logical   Suicidal Thoughts:  No  Homicidal Thoughts:  No  Memory:  WNL  Judgement:  Good  Insight:  Good  Psychomotor Activity:  Normal  Concentration:  Concentration: Good and Attention Span: Good  Recall:  Good  Fund of Knowledge: Good  Language: Good  Assets:  Desire for Improvement Financial Resources/Insurance Housing Transportation  ADL's:  Intact  Cognition: WNL  Prognosis:  Good   GeneSight test results are in chart had moderately reduced folic acid conversion  DIAGNOSES:    ICD-10-CM   1. Major depressive disorder, recurrent episode, moderate (HCC)  F33.1     2. Generalized anxiety disorder  F41.1     3. Malaise and fatigue  R53.81    R53.83       Receiving Psychotherapy: No   RECOMMENDATIONS:  PDMP was reviewed.  Ativan filled 02/21/2022. I provided 20 minutes of face to face time during this encounter, including time spent before and after the visit in records review, medical decision making, counseling pertinent to today's visit, and charting.   It is hard to say whether the Latuda would be helping yet or not, it has been 1 month.  Part of the reason that we scheduled this appointment was to go over the treatment plan from neurology, unfortunately that appointment had to be rescheduled when she had COVID.  Having had COVID since the last visit makes it difficult to gauge the efficacy of the addition of Latuda as well.  We agreed to leave her medications the same for now. I asked her to wait twice a week and keep a record of it.  If she is continuing to gain weight over the next few weeks then we will stop the [place].  It usually does not cause much weight gain at all but it is a possibility.  Continue Prozac 80 mg daily. Continue Ativan 1 mg, 1 po tid as needed. Continue Latuda 20 mg, 1 p.o. q. evening with at least 350 cal. Return in 4 weeks.  Donnal Moat, PA-C

## 2022-03-21 NOTE — Progress Notes (Unsigned)
NEUROLOGY FOLLOW UP OFFICE NOTE  Yolanda Wolfe [id number]  Assessment/Plan:   Migraine without aura without status migrainosus, not intractable Idiopathic intracranial hypertension   Migraine prevention:  Emgality Migraine rescue:  Nurtec.  Zofran for nausea Acetazolamide '500mg'$  twice daily Limit use of pain relievers to no more than 2 days out of week to prevent risk of rebound or medication-overuse headache. Keep headache diary Recommend weight loss and discontinue Retin-A Follow up 4 months.  Subjective:  Yolanda Wolfe is a 46 year old right-handed female with depression and anxiety who follows up for migraines.   UPDATE: Aimovig ineffective.  Started [employer]. Intensity:  severe Duration:  2-3 hours with Roselyn Meier.  Usually needs two doses.   Frequency: 7 a month  Referred to ophthalmology.  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  LP on 01/19/2022 demonstrated a borderline elevated opening pressure of 23 cm water.  MRV of head on 02/04/2022 showed wasting at the bilateral transverse sigmoid junctions.  She was started on acetazolamide '500mg'$  twice daily.  ***  Frequency of abortive medication: 7 days this month. Current NSAIDS/analgesics:  Excedrin Current triptans:  none Current ergotamine:  none Current anti-emetic:  Zofran ODT '4mg'$  Current muscle relaxants:  none Current Antihypertensive medications:  none Current Antidepressant medications:  Fluoxetine '40mg'$  daily Current Anticonvulsant medications:  none Current anti-CGRP:  Emaglity, Ubrelvy '100mg'$  Current Vitamins/Herbal/Supplements:  Deplin, melatonin Current Antihistamines/Decongestants:  none Other therapy:  none Hormone/birth control:  none Other medications:  Lorazepam '2mg'$  QHS   Caffeine:  Sweet tea.  No coffee Diet:  80 oz water daily.  Opto diet.  Does not skip meals.  Optavia Diet. Exercise:  no Depression:  yes;  Anxiety:  yes Other pain:  no Sleep:  Poor.  Lorazepam helps her stay asleep.  Wakes up tired.  Sleep apnea ruled out.    HISTORY:  Headaches since childhood.  They are severe diffuse but primarily bi-temporal pain with associated nausea, photophobia, phonophobia but no associated vomiting, visual disturbance, autonomic symptoms, numbness or weakness.  They have progressively gotten worse over the past year from every 2 days-every 2 weeks to daily.  She reports increased stress and anxiety.  They last hours to 2 days.  Previously would occur every 2 days to every 2 weeks.  No explainable trigger.  No known relieving factors.  She has been taking Excedrin almost daily.     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   Reports tinnitus in right ear.  Audiometric testing reportedly normal.  She started experiencing right sided pulsatile tinnitus around December 2022.  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  Denies visual obscurations or blurred vision but feels vision isn't as great as it used to be.      Past NSAIDS/analgesics:  Ibuprofen, Tylenol Past abortive triptans:  rizatriptan, sumatriptan tab Past abortive ergotamine:  none Past muscle relaxants:  none Past anti-emetic:  none Past antihypertensive medications:  none Past antidepressant medications:  Amitriptyline, sertraline Past anticonvulsant medications:  topiramate Past anti-CGRP:  Aimovig '140mg'$  Past vitamins/Herbal/Supplements:  none Past antihistamines/decongestants:  none Other past therapies:  none     Family history of  headache:  No  PAST MEDICAL HISTORY: Past Medical History:  Diagnosis Date    Anxiety    Blood transfusion without reported diagnosis    Depression    Frequent headaches    GERD (gastroesophageal reflux disease)    Heart murmur    Migraine    UTI (lower urinary tract infection)     MEDICATIONS: Current Outpatient Medications on File Prior to Visit  Medication Sig Dispense Refill   acetaZOLAMIDE (DIAMOX) 250 MG tablet TAKE 1 TABLET BY MOUTH TWICE A DAY 60 tablet 0   acetaZOLAMIDE (DIAMOX) 250 MG tablet TAKE 1 TABLET BY MOUTH TWICE A DAY 60 tablet 0   FLUoxetine (PROZAC) 40 MG capsule Take 2 capsules (80 mg total) by mouth daily. 180 capsule 3   Galcanezumab-gnlm (EMGALITY [place]) Inject into the skin.     LORazepam (ATIVAN) 1 MG tablet 1 po qd prn and 2 po qhs. 90 tablet 5   lurasidone (LATUDA) 20 MG TABS tablet Take 1 tablet (20 mg total) by mouth daily with supper. 30 tablet 1   molnupiravir EUA (LAGEVRIO) 200 mg CAPS capsule Take 4 capsules by mouth twice daily for 5 days 40 capsule 0   NURTEC 75 MG TBDP TAKE 1 TABLET BY MOUTH EVERY DAY AS NEEDED 16 tablet 0   tretinoin (RETIN-A) 0.1 % cream SMARTSIG:Topical Every Evening     Ubrogepant (UBRELVY) 100 MG TABS Take 1 tablet by mouth as needed (May repeat after 2 hours.  Maximum 2 tablets in 24 hours.). (Patient not taking: Reported on 03/14/2022) 16 tablet 5   No current facility-administered medications on file prior to visit.    ALLERGIES: Allergies  Allergen Reactions   Wellbutrin [Bupropion] Other (See Comments)    Worsened migraines    FAMILY HISTORY: Family History  Problem Relation Age of Onset   Cancer Maternal Grandmother        breast   Heart disease Maternal Grandfather    Alcohol abuse Paternal Grandfather    Dementia Paternal Grandmother    Cancer Mother        neuroendocrine cancer   Cancer Father        skin   Healthy Daughter       Objective:  *** General: No acute distress.  Patient appears well-groomed.     Metta Clines, DO  CC: Betty Martinique, MD

## 2022-03-23 ENCOUNTER — Encounter: Payer: Self-pay | Admitting: Neurology

## 2022-03-23 ENCOUNTER — Telehealth: Payer: Self-pay

## 2022-03-23 ENCOUNTER — Ambulatory Visit: Payer: BC Managed Care – PPO | Admitting: Neurology

## 2022-03-23 VITALS — BP 104/70 | HR 78 | Ht 64.0 in | Wt 194.4 lb

## 2022-03-23 DIAGNOSIS — G43709 Chronic migraine without aura, not intractable, without status migrainosus: Secondary | ICD-10-CM

## 2022-03-23 DIAGNOSIS — G932 Benign intracranial hypertension: Secondary | ICD-10-CM | POA: Diagnosis not present

## 2022-03-23 MED ORDER — NURTEC 75 MG PO TBDP: 1.0000 | ORAL_TABLET | Freq: Every day | ORAL | 5 refills | Status: DC | PRN

## 2022-03-23 NOTE — Telephone Encounter (Signed)
Patient seen in office today. Per Dr.Jaffe start Botox.  PA team please start a PA for this patient. Dr.Jaffe would like to try and get her scheduled for 10/20 Botox day.

## 2022-03-23 NOTE — Patient Instructions (Signed)
Plan to switch from the shot to Botox Nurtec as needed/directed Continue acetazolamide for now.  Get a repeat eye exam and if everything looks okay, will stop Follow up for Botox

## 2022-03-30 ENCOUNTER — Ambulatory Visit: Payer: BC Managed Care – PPO | Admitting: Family Medicine

## 2022-03-30 VITALS — BP 110/70 | HR 77 | Temp 98.2°F | Resp 12 | Ht 64.0 in | Wt 197.0 lb

## 2022-03-30 DIAGNOSIS — R200 Anesthesia of skin: Secondary | ICD-10-CM | POA: Diagnosis not present

## 2022-03-30 DIAGNOSIS — R0602 Shortness of breath: Secondary | ICD-10-CM | POA: Diagnosis not present

## 2022-03-30 DIAGNOSIS — R4789 Other speech disturbances: Secondary | ICD-10-CM | POA: Diagnosis not present

## 2022-03-30 DIAGNOSIS — R5383 Other fatigue: Secondary | ICD-10-CM | POA: Diagnosis not present

## 2022-03-30 DIAGNOSIS — Z23 Encounter for immunization: Secondary | ICD-10-CM | POA: Diagnosis not present

## 2022-03-30 DIAGNOSIS — F332 Major depressive disorder, recurrent severe without psychotic features: Secondary | ICD-10-CM

## 2022-03-30 DIAGNOSIS — R202 Paresthesia of skin: Secondary | ICD-10-CM

## 2022-03-30 NOTE — Patient Instructions (Addendum)
A few things to remember from today's visit:  Numbness and tingling - Plan: Comprehensive metabolic panel, Vitamin B84  Other fatigue - Plan: Comprehensive metabolic panel, CBC, C-reactive protein, Vitamin B12  Word finding difficulty - Plan: Vitamin B12  SOB (shortness of breath) - Plan: CBC, C-reactive protein, DG Chest 2 View  Based on our discussion and examination, I have compiled a list of instructions for you to follow:  1. Labs and Exams: We will be conducting the following tests to help determine the cause of your symptoms:    - Blood tests: CBC, B12, inflammation markers, liver and kidney function, and electrolytes    - Chest X-ray: To check for any lung abnormalities related to your shortness of breath and recent COVID infection  2. Behavior Changes: Please try to maintain a healthy diet and avoid relying on convenience foods. Incorporate more nutritious options into your meals and consider increasing your physical activity as tolerated.  3. Medication: Remember to take your Latuda consistently as prescribed by your psychiatrist. This will help determine its effectiveness during your follow-up appointment.  4. Follow-ups: Keep your appointment with your psychiatrist on October 25th. Discuss the effectiveness of Latuda and any concerns you may have regarding your mental health.  5.Emergency Situations: If you experience strong thoughts of self-harm or harming others, please seek immediate help by going to the nearest emergency room or calling a crisis hotline.  If you need refills for medications you take chronically, please call your pharmacy. Do not use My Chart to request refills or for acute issues that need immediate attention. If you send a my chart message, it may take a few days to be addressed, specially if I am not in the office.  Please be sure medication list is accurate. If a new problem present, please set up appointment sooner than planned today.

## 2022-03-30 NOTE — Progress Notes (Signed)
ACUTE VISIT Chief Complaint  Patient presents with   Follow-up    Dx with covid a month ago, still having some problems; motor skill issues, sense of taste is off, and equilibrium is off. BP has also been running low, usually in the 120s, but has been in 110 and 104.   HPI: [name] is a 46 y.o. female, who is here today with above concerns.  She presents with ongoing "motor skill" issues, including difficulty with speech and lightheadedness, following a COVID-19 diagnosis a month ago. She reports shortness of breath, fatigue, and altered taste.  Difficulty finding words.  Her blood pressure has been running low, with a recent reading of 104/70. During the COVID-19 infection, she experienced fever and flu-like symptoms for a week and a half.  She currently has a mild cough, no wheezing or CP.  Chronic headache, recently started on acetazolamide 250 mg bid about a month ago, follows with neurologist. Last follow-up with neurology was called 03/23/2022, she reported neurological examination with no immediate abnormalities.  She reports excessive tiredness, which is worse than previously experienced. The patient had a sleep study a few years ago and did not have sleep apnea. She is also experiencing weight gain, which she thinks may be due to decreased activity, medication side effects, or poor dietary choices.  Depression: She is currently experiencing a worsening of symptoms, including difficulty finding joy in previously enjoyed activities.  She admits to taking Latuda inconsistently. She is also on fluoxetine 40 mg daily and lorazepam 1 mg 2 tablets daily at bedtime. She has an upcoming appointment with a psychiatrist on October 25th/23. Denies having a suicidal plan but has had suicidal thoughts.  Tingling in the hands and feet, which she thinks is related to acetazolamide use.  Review of Systems  Constitutional:  Positive for activity change. Negative for  diaphoresis and fever.  HENT:  Negative for mouth sores, nosebleeds and sore throat.   Eyes:  Negative for redness and visual disturbance.  Cardiovascular:  Negative for palpitations and leg swelling.  Gastrointestinal:  Negative for abdominal pain, nausea and vomiting.       Negative for changes in bowel habits.  Endocrine: Negative for cold intolerance and heat intolerance.  Genitourinary:  Negative for decreased urine volume, dysuria and hematuria.  Musculoskeletal:  Negative for gait problem and myalgias.  Skin:  Negative for rash.  Neurological:  Negative for syncope, facial asymmetry and weakness.  Hematological:  Negative for adenopathy. Does not bruise/bleed easily.  Rest see pertinent positives and negatives per HPI.  Current Outpatient Medications on File Prior to Visit  Medication Sig Dispense Refill   acetaZOLAMIDE (DIAMOX) 250 MG tablet TAKE 1 TABLET BY MOUTH TWICE A DAY 60 tablet 0   acetaZOLAMIDE (DIAMOX) 250 MG tablet TAKE 1 TABLET BY MOUTH TWICE A DAY 60 tablet 0   FLUoxetine (PROZAC) 40 MG capsule Take 2 capsules (80 mg total) by mouth daily. 180 capsule 3   LORazepam (ATIVAN) 1 MG tablet 1 po qd prn and 2 po qhs. (Patient taking differently: Take 1 mg by mouth at bedtime.  2 po qhs.) 90 tablet 5   lurasidone (LATUDA) 20 MG TABS tablet Take 1 tablet (20 mg total) by mouth daily with supper. 30 tablet 1   Rimegepant Sulfate (NURTEC) 75 MG TBDP Take 1 tablet by mouth daily as needed. 16 tablet 5   tretinoin (RETIN-A) 0.1 % cream SMARTSIG:Topical Every Evening     No current facility-administered medications on  file prior to visit.   Past Medical History:  Diagnosis Date   Anxiety    Blood transfusion without reported diagnosis    Depression    Frequent headaches    GERD (gastroesophageal reflux disease)    Heart murmur    Migraine    UTI (lower urinary tract infection)    Allergies  Allergen Reactions   Wellbutrin [Bupropion] Other (See Comments)    Worsened  migraines   Social History   Socioeconomic History   Marital status: Married    Spouse name: Not on file   Number of children: 1   Years of education: Not on file   Highest education level: Doctorate  Occupational History    Comment: college counselor and admissions counselor  Tobacco Use   Smoking status: Never   Smokeless tobacco: Never  Vaping Use   Vaping Use: Never used  Substance and Sexual Activity   Alcohol use: Yes    Alcohol/week: 1.0 standard drink of alcohol    Types: 1 Glasses of wine per week    Comment: maybe 1-2 monthly   Drug use: No   Sexual activity: Yes    Partners: Male    Birth control/protection: Surgical  Other Topics Concern   Not on file  Social History Narrative   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    Remarried last year, husband is Astronomer.   She and ex husband coparent their almost 24 yo daughter.   She grew up in [place], went to [employer], married 1st husband, traumatic birth see HPI.       Caffeine-no more than 1 per day, and not over 4 days a week.    Legal-bancruptcy when she got divorced.    Religion-Quaker.   Right handed   Social Determinants of Health   Financial Resource Strain: Low Risk  (03/30/2022)   Overall Financial Resource Strain (CARDIA)    Difficulty of Paying Living Expenses: Not hard at all  Food Insecurity: No Food Insecurity (03/30/2022)   Hunger Vital Sign    Worried About Running Out of Food in the Last Year: Never true    Ran Out of Food in the Last Year: Never true  Transportation Needs: No Transportation Needs (03/30/2022)   PRAPARE - Hydrologist (Medical): No    Lack of Transportation (Non-Medical): No  Physical Activity: Insufficiently Active (03/30/2022)   Exercise Vital Sign    Days of Exercise per Week: 3 days    Minutes of Exercise per Session: 30 min  Stress: Stress Concern Present  (03/30/2022)   [place]    Feeling of Stress : To some extent  Social Connections: Moderately Integrated (03/30/2022)   Social Connection and Isolation Panel [NHANES]    Frequency of Communication with Friends and Family: More than three times a week    Frequency of Social Gatherings with Friends and Family: Three times a week    Attends Religious Services: 1 to 4 times per year    Active Member of Clubs or Organizations: No    Attends Archivist Meetings: Not on file    Marital Status: Married   Vitals:   03/30/22 1456  BP: 110/70  Pulse: 77  Resp: 12  Temp: 98.2 F (36.8 C)  SpO2: 97%  Body mass index is 33.81 kg/m.  Physical Exam Vitals and nursing note reviewed.  Constitutional:      General: She is not in acute distress.    Appearance: She is well-developed.  HENT:     Head: Normocephalic and atraumatic.     Mouth/Throat:     Mouth: Mucous membranes are moist.     Pharynx: Oropharynx is clear.  Eyes:     Conjunctiva/sclera: Conjunctivae normal.  Cardiovascular:     Rate and Rhythm: Normal rate and regular rhythm.     Heart sounds: No murmur heard. Pulmonary:     Effort: Pulmonary effort is normal. No respiratory distress.     Breath sounds: Normal breath sounds.  Abdominal:     Palpations: Abdomen is soft. There is no hepatomegaly or mass.     Tenderness: There is no abdominal tenderness.  Lymphadenopathy:     Upper Body:     Right upper body: No supraclavicular adenopathy.     Left upper body: No supraclavicular adenopathy.     Comments: Palpable superficial cervical lymph nodes, bilateral and symmetric, 1 cm.  Skin:    General: Skin is warm.     Findings: No erythema or rash.  Neurological:     General: No focal deficit present.     Mental Status: She is alert and oriented to person, place, and time.     Cranial Nerves: No cranial nerve deficit.     Gait: Gait normal.   Psychiatric:        Mood and Affect: Affect is flat.        Thought Content: Thought content does not include homicidal or suicidal ideation. Thought content does not include homicidal or suicidal plan.   ASSESSMENT AND PLAN:  Ms.Kenady was seen today for follow-up.  Diagnoses and all orders for this visit: Orders Placed This Encounter  Procedures   DG Chest 2 View   Flu Vaccine QUAD 25moIM (Fluarix, Fluzone & Alfiuria Quad PF)   Comprehensive metabolic panel   CBC   C-reactive protein   Vitamin B12    SOB (shortness of breath) Residual after COVID 19 infection about 5 weeks ago. We discussed possible etiologies. Lung examination today negative. ? Deconditioning. Instructed about warning signs. CXR and blood work ordered today.  Numbness and tingling It is mild, no associated weakness. Thinks it could be acetazolamide.  B12 and TSH ordered today. Continue following with neurologist.  Other fatigue We discussed possible etiologies: Systemic illness, immunologic,endocrinology,sleep disorder, psychiatric/psychologic, infectious,medications side effects, and idiopathic. Some of her comorbilities can be contributing factors, including depression. Reported sleep study years ago negative for OSA. Examination today does not suggest a serious process. Healthy diet and regular physical activity may help.  Further recommendations will be given according to lab results.  Word finding difficulty This as well as her other complaints today could be caused by post COVID synd and may last week to months. But can also be related to depression. Reporting addressing this problem with her neurologist and examination was normal. Monitor for new symptoms.  Need for influenza vaccination -     Flu Vaccine QUAD 61moM (Fluarix, Fluzone & Alfiuria Quad PF)  Severe episode of recurrent major depressive disorder, without psychotic features ([place]   She reports worsening symptoms.     Encourage patient to take Latuda consistently and follow up with the psychiatrist on October 25th. Remind patient to seek emergency care if thoughts of self-harm become more severe.  She did not have time today for labs and CXR, she is coming back tomorrow.  Return if symptoms  worsen or fail to improve, for Depending of lab results..  Gaelyn Tukes G. Martinique, MD  [facility]. [place] office.

## 2022-03-31 ENCOUNTER — Encounter: Payer: Self-pay | Admitting: Family Medicine

## 2022-04-01 ENCOUNTER — Other Ambulatory Visit (INDEPENDENT_AMBULATORY_CARE_PROVIDER_SITE_OTHER): Payer: BC Managed Care – PPO

## 2022-04-01 ENCOUNTER — Other Ambulatory Visit (HOSPITAL_COMMUNITY): Payer: Self-pay

## 2022-04-01 DIAGNOSIS — R200 Anesthesia of skin: Secondary | ICD-10-CM

## 2022-04-01 DIAGNOSIS — R202 Paresthesia of skin: Secondary | ICD-10-CM

## 2022-04-01 DIAGNOSIS — R5383 Other fatigue: Secondary | ICD-10-CM

## 2022-04-01 DIAGNOSIS — R4789 Other speech disturbances: Secondary | ICD-10-CM | POA: Diagnosis not present

## 2022-04-01 DIAGNOSIS — R0602 Shortness of breath: Secondary | ICD-10-CM | POA: Diagnosis not present

## 2022-04-01 LAB — CBC
HCT: 36.7 % (ref 36.0–46.0)
Hemoglobin: 12.4 g/dL (ref 12.0–15.0)
MCHC: 33.7 g/dL (ref 30.0–36.0)
MCV: 87.5 fl (ref 78.0–100.0)
Platelets: 259 10*3/uL (ref 150.0–400.0)
RBC: 4.2 Mil/uL (ref 3.87–5.11)
RDW: 14.6 % (ref 11.5–15.5)
WBC: 9.3 10*3/uL (ref 4.0–10.5)

## 2022-04-01 LAB — COMPREHENSIVE METABOLIC PANEL
ALT: 10 U/L (ref 0–35)
AST: 11 U/L (ref 0–37)
Albumin: 4.2 g/dL (ref 3.5–5.2)
Alkaline Phosphatase: 97 U/L (ref 39–117)
BUN: 14 mg/dL (ref 6–23)
CO2: 24 mEq/L (ref 19–32)
Calcium: 8.9 mg/dL (ref 8.4–10.5)
Chloride: 105 mEq/L (ref 96–112)
Creatinine, Ser: 0.79 mg/dL (ref 0.40–1.20)
GFR: 90.84 mL/min (ref >60.00)
Glucose, Bld: 83 mg/dL (ref 70–99)
Potassium: 3.8 mEq/L (ref 3.5–5.1)
Sodium: 135 mEq/L (ref 135–145)
Total Bilirubin: 0.4 mg/dL (ref 0.2–1.2)
Total Protein: 7.3 g/dL (ref 6.0–8.3)

## 2022-04-01 LAB — C-REACTIVE PROTEIN: CRP: <1 mg/dL (ref 0.5–20.0)

## 2022-04-01 LAB — VITAMIN B12: Vitamin B-12: 272 pg/mL (ref 211–911)

## 2022-04-01 NOTE — Telephone Encounter (Signed)
[employer] benefits verification submitted   Key: [id number]

## 2022-04-01 NOTE — Telephone Encounter (Signed)
Patient Advocate Encounter   Received notification that prior authorization for Botox 200UNIT solution is required.   PA submitted on 04/01/2022 Key [id number] Status is pending       Lyndel Safe, [place] Patient Advocate Specialist [place] Patient Advocate Team Direct Number: [phone]  Fax: [phone]

## 2022-04-05 NOTE — Telephone Encounter (Signed)
   [employer] verification benefits scanned to chart

## 2022-04-06 ENCOUNTER — Encounter: Payer: Self-pay | Admitting: Physician Assistant

## 2022-04-06 ENCOUNTER — Ambulatory Visit: Payer: BC Managed Care – PPO | Admitting: Physician Assistant

## 2022-04-06 DIAGNOSIS — F411 Generalized anxiety disorder: Secondary | ICD-10-CM | POA: Diagnosis not present

## 2022-04-06 DIAGNOSIS — G4700 Insomnia, unspecified: Secondary | ICD-10-CM

## 2022-04-06 DIAGNOSIS — R519 Headache, unspecified: Secondary | ICD-10-CM

## 2022-04-06 DIAGNOSIS — F331 Major depressive disorder, recurrent, moderate: Secondary | ICD-10-CM | POA: Diagnosis not present

## 2022-04-06 DIAGNOSIS — G8929 Other chronic pain: Secondary | ICD-10-CM

## 2022-04-06 MED ORDER — LURASIDONE HCL 40 MG PO TABS
40.0000 mg | ORAL_TABLET | Freq: Every day | ORAL | 1 refills | Status: DC
Start: 2022-04-06 — End: 2022-05-16

## 2022-04-06 NOTE — Progress Notes (Unsigned)
[facility]  Patient ID: Yolanda Wolfe,  MRN: [id number]  PCP: Martinique, Betty G, MD  Date of Evaluation: 04/06/2022  Time spent:20 minutes  Chief Complaint:  Chief Complaint   Anxiety; Depression; Follow-up    HISTORY/CURRENT STATUS: HPI  For routine med check.  Yolanda Wolfe feels better since we added Latuda around 6 weeks ago.  Her mood is brighter, she feels more energetic and wants to do things, ADLs and personal hygiene are normal, appetite is normal and weight is stable.  She is not isolating.  Not crying easily.  She wonders if increasing the dose will help even more though.  No suicidal or homicidal thoughts.  She saw Dr. Tomi Likens a few weeks ago for chronic migraines, she will be starting Botox treatments as soon as insurance approves it.  Good daily headaches are still a problem but she is hopeful that the new treatment will help.  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  Sleeps well most of the time but does require Ativan every night.  Does get overwhelmed at times, feels uneasy about things but no panic attacks.  She is enjoying riding her horse that she got several months ago.  That makes her happy.  Patient denies increased energy with decreased need for sleep, increased talkativeness, racing thoughts, impulsivity or risky behaviors, increased spending, increased libido, grandiosity, increased irritability or anger, paranoia, or hallucinations.  Denies dizziness, syncope, seizures, numbness, tingling, tremor, tics, unsteady gait, slurred speech, confusion. Denies muscle or joint pain, stiffness, or dystonia.   Individual Medical History/ Review of Systems: Changes? :Yes    saw Dr. Tomi Likens a few weeks ago.   Past medications for mental health diagnoses include: Amitriptylline for sleep? Celexa, Lunesta, Ativan, Zoloft, Belsomra, Temazapam Topamax for migraines, Wellbutrin caused migraines to worsen, trazodone not effective at  low doses but at 100 mg she was too groggy.  Allergies: Wellbutrin [bupropion]  Current Medications:  Current Outpatient Medications:    acetaZOLAMIDE (DIAMOX) 250 MG tablet, TAKE 1 TABLET BY MOUTH TWICE A DAY, Disp: 60 tablet, Rfl: 0   acetaZOLAMIDE (DIAMOX) 250 MG tablet, TAKE 1 TABLET BY MOUTH TWICE A DAY, Disp: 60 tablet, Rfl: 0   FLUoxetine (PROZAC) 40 MG capsule, Take 2 capsules (80 mg total) by mouth daily., Disp: 180 capsule, Rfl: 3   LORazepam (ATIVAN) 1 MG tablet, 1 po qd prn and 2 po qhs. (Patient taking differently: Take 1 mg by mouth at bedtime.  2 po qhs.), Disp: 90 tablet, Rfl: 5   lurasidone (LATUDA) 40 MG TABS tablet, Take 1 tablet (40 mg total) by mouth daily with breakfast., Disp: 30 tablet, Rfl: 1   Rimegepant Sulfate (NURTEC) 75 MG TBDP, Take 1 tablet by mouth daily as needed., Disp: 16 tablet, Rfl: 5   tretinoin (RETIN-A) 0.1 % cream, SMARTSIG:Topical Every Evening, Disp: , Rfl:  Medication Side Effects: headache possibly from Wellbutrin, now or not sure if it was coincidental.  Family Medical/ Social History: Changes? no  MENTAL HEALTH EXAM:  There were no vitals taken for this visit.There is no height or weight on file to calculate BMI.  General Appearance: Casual and Well Groomed  Eye Contact:  Good  Speech:  Clear and Coherent and Normal Rate  Volume:  Normal  Mood:  Euthymic  Affect:  Congruent  Thought Process:  Goal Directed and Descriptions of Associations: Circumstantial  Orientation:  Full (Time, Place, and Person)  Thought Content: Logical   Suicidal Thoughts:  No  Homicidal Thoughts:  No  Memory:  WNL  Judgement:  Good  Insight:  Good  Psychomotor Activity:  Normal  Concentration:  Concentration: Good and Attention Span: Good  Recall:  Good  Fund of Knowledge: Good  Language: Good  Assets:  Desire for Improvement Financial Resources/Insurance Housing Transportation  ADL's:  Intact  Cognition: WNL  Prognosis:  Good   GeneSight test  results are in chart had moderately reduced folic acid conversion  DIAGNOSES:    ICD-10-CM   1. Major depressive disorder, recurrent episode, moderate (HCC)  F33.1     2. Generalized anxiety disorder  F41.1     3. Insomnia, unspecified type  G47.00     4. Chronic nonintractable headache, unspecified headache type  R51.9    G89.29       Receiving Psychotherapy: No   RECOMMENDATIONS:  PDMP was reviewed.  Ativan filled 02/21/2022. I provided 20 minutes of face to face time during this encounter, including time spent before and after the visit in records review, medical decision making, counseling pertinent to today's visit, and charting.   I am glad to see her doing better!  I do think increasing the Latuda is a good idea.  She is on the very lowest dose as an adjuvant for major depressive disorder so increasing is certainly appropriate.  Continue Prozac 80 mg daily. Continue Ativan 1 mg, 1 po tid as needed. Increase Latuda to 40 mg p.o. daily with food. Return in 4-6 weeks.  Donnal Moat, PA-C

## 2022-04-07 NOTE — Telephone Encounter (Signed)
Patient Advocate Encounter  Prior Authorization for Botox 200UNIT solution  has been approved.    PA# [id number] Effective dates: 04/01/2022 through 10/01/2022      Lyndel Safe, [place] Patient Advocate Specialist [place] Patient Advocate Team Direct Number: [phone]  Fax: [phone]

## 2022-04-13 ENCOUNTER — Other Ambulatory Visit (HOSPITAL_COMMUNITY): Payer: Self-pay

## 2022-04-13 ENCOUNTER — Ambulatory Visit: Payer: BC Managed Care – PPO | Admitting: Physician Assistant

## 2022-04-14 ENCOUNTER — Other Ambulatory Visit (HOSPITAL_COMMUNITY): Payer: Self-pay

## 2022-04-14 ENCOUNTER — Other Ambulatory Visit: Payer: Self-pay | Admitting: Neurology

## 2022-04-14 MED ORDER — BOTOX 200 UNITS IJ SOLR
INTRAMUSCULAR | 4 refills | Status: DC
  Filled 2022-04-14: qty 1, fill #0
  Filled 2022-04-18: qty 1, 90d supply, fill #0
  Filled 2022-07-19: qty 1, 90d supply, fill #1
  Filled 2022-10-18: qty 1, 90d supply, fill #2
  Filled 2023-01-12: qty 1, 90d supply, fill #3

## 2022-04-14 NOTE — Telephone Encounter (Addendum)
Patient advised of Approval. Script sent to [facility].   Patient advised to please call [facility] with Botox savings information if approved to have Botox delivered on time.

## 2022-04-14 NOTE — Addendum Note (Signed)
Addended by: Venetia Night on: 04/14/2022 09:49 AM   Modules accepted: Orders

## 2022-04-18 ENCOUNTER — Other Ambulatory Visit (HOSPITAL_COMMUNITY): Payer: Self-pay

## 2022-04-19 ENCOUNTER — Other Ambulatory Visit (HOSPITAL_COMMUNITY): Payer: Self-pay

## 2022-04-20 ENCOUNTER — Other Ambulatory Visit (HOSPITAL_COMMUNITY): Payer: Self-pay

## 2022-04-27 ENCOUNTER — Other Ambulatory Visit (HOSPITAL_COMMUNITY): Payer: Self-pay

## 2022-04-28 ENCOUNTER — Other Ambulatory Visit (HOSPITAL_COMMUNITY): Payer: Self-pay

## 2022-05-02 ENCOUNTER — Other Ambulatory Visit (HOSPITAL_COMMUNITY): Payer: Self-pay

## 2022-05-04 ENCOUNTER — Telehealth: Payer: Self-pay | Admitting: Neurology

## 2022-05-04 NOTE — Telephone Encounter (Signed)
Pt called in and left a message. She would like to make sure there is nothing else she needs to do before her botox injection 05/06/22?

## 2022-05-04 NOTE — Telephone Encounter (Signed)
LMOVM Botox is in office.

## 2022-05-06 ENCOUNTER — Ambulatory Visit: Payer: BC Managed Care – PPO | Admitting: Neurology

## 2022-05-06 DIAGNOSIS — G43709 Chronic migraine without aura, not intractable, without status migrainosus: Secondary | ICD-10-CM | POA: Diagnosis not present

## 2022-05-06 MED ORDER — ONABOTULINUMTOXINA 100 UNITS IJ SOLR
200.0000 [IU] | Freq: Once | INTRAMUSCULAR | Status: AC
  Administered 2022-05-06: 155 [IU] via INTRAMUSCULAR

## 2022-05-06 NOTE — Progress Notes (Signed)
[facility]  ° °Procedure Note Botox ° °Attending: Dr. Meoshia Billing ° °Preoperative Diagnosis(es): Chronic migraine ° °Consent obtained from: The patient °Benefits discussed included, but were not limited to decreased muscle tightness, increased joint range of motion, and decreased pain.  Risk discussed included, but were not limited pain and discomfort, bleeding, bruising, excessive weakness, venous thrombosis, muscle atrophy and dysphagia.  Anticipated outcomes of the procedure as well as he risks and benefits of the alternatives to the procedure, and the roles and tasks of the personnel to be involved, were discussed with the patient, and the patient consents to the procedure and agrees to proceed. A copy of the patient medication guide was given to the patient which explains the blackbox warning. ° °Patients identity and treatment sites confirmed Yes.  . ° °Details of Procedure: °Skin was cleaned with alcohol. Prior to injection, the needle plunger was aspirated to make sure the needle was not within a blood vessel.  There was no blood retrieved on aspiration.   ° °Following is a summary of the muscles injected  And the amount of Botulinum toxin used: ° °Dilution °200 units of Botox was reconstituted with 4 ml of preservative free normal saline. °Time of reconstitution: At the time of the office visit (<30 minutes prior to injection)  ° °Injections  °155 total units of Botox was injected with a 30 gauge needle. ° °Injection Sites: °L occipitalis: 15 units- 3 sites  °R occiptalis: 15 units- 3 sites ° °L upper trapezius: 15 units- 3 sites °R upper trapezius: 15 units- 3 sits          °L paraspinal: 10 units- 2 sites °R paraspinal: 10 units- 2 sites ° °Face °L frontalis(2 injection sites):10 units   °R frontalis(2 injection sites):10 units         °L corrugator: 5 units   °R corrugator: 5 units           °Procerus: 5 units   °L temporalis: 20 units °R temporalis: 20 units  ° °Agent:  °200 units of botulinum Type  A (Onobotulinum Toxin type A) was reconstituted with 4 ml of preservative free normal saline.  °Time of reconstitution: At the time of the office visit (<30 minutes prior to injection)  ° ° ° Total injected (Units):  155 ° Total wasted (Units):  45 ° °Patient tolerated procedure well without complications.   °Reinjection is anticipated in 3 months. ° ° °

## 2022-05-16 ENCOUNTER — Ambulatory Visit (INDEPENDENT_AMBULATORY_CARE_PROVIDER_SITE_OTHER): Payer: BC Managed Care – PPO | Admitting: Physician Assistant

## 2022-05-16 ENCOUNTER — Encounter: Payer: Self-pay | Admitting: Physician Assistant

## 2022-05-16 DIAGNOSIS — F331 Major depressive disorder, recurrent, moderate: Secondary | ICD-10-CM

## 2022-05-16 DIAGNOSIS — Z79899 Other long term (current) drug therapy: Secondary | ICD-10-CM | POA: Diagnosis not present

## 2022-05-16 DIAGNOSIS — F411 Generalized anxiety disorder: Secondary | ICD-10-CM

## 2022-05-16 DIAGNOSIS — G4700 Insomnia, unspecified: Secondary | ICD-10-CM

## 2022-05-16 DIAGNOSIS — R5383 Other fatigue: Secondary | ICD-10-CM

## 2022-05-16 DIAGNOSIS — R5381 Other malaise: Secondary | ICD-10-CM

## 2022-05-16 MED ORDER — LURASIDONE HCL 60 MG PO TABS: 60.0000 mg | ORAL_TABLET | Freq: Every day | ORAL | 1 refills | Status: DC

## 2022-05-16 NOTE — Progress Notes (Signed)
[facility]  Patient ID: Yolanda Wolfe,  MRN: [id number]  PCP: [place], Betty G, MD  Date of Evaluation: 05/16/2022  Time spent:30 minutes  Chief Complaint:  Chief Complaint   Follow-up    HISTORY/CURRENT STATUS: HPI  For routine med check.  Not wanting to do things, sad, lonely, gets tired easily.  Ever since she had COVID she has been tiring really easily.  She is still working, teaches at [employer].  She enjoys riding her horse but it takes a lot of stamina.  She can sleep for 12 hours and still wake up tired.  Appetite is normal and weight is stable.  ADLs and personal hygiene are normal.  No changes in memory, focus, or concentration.  No suicidal or homicidal thoughts.  Patient denies increased energy with decreased need for sleep, increased talkativeness, racing thoughts, impulsivity or risky behaviors, increased spending, increased libido, grandiosity, increased irritability or anger, paranoia, or hallucinations.  Still has anxiety at times.  Takes the Ativan every evening for sure, sometimes 1 during the day.  If she does not take the evening dose then she is not able to relax to fall asleep.  Not having panic attacks but when she does take it it is more because of being overwhelmed.  Denies dizziness, syncope, seizures, numbness, tingling, tremor, tics, unsteady gait, slurred speech, confusion. Denies muscle or joint pain, stiffness, or dystonia.   Individual Medical History/ Review of Systems: Changes? :Yes    started on B12 due to low normal numbers.  Past medications for mental health diagnoses include: Amitriptylline for sleep? Celexa, Lunesta, Ativan, Zoloft, Belsomra, Temazapam Topamax for migraines, Wellbutrin caused migraines to worsen, trazodone not effective at low doses but at 100 mg she was too groggy.  Allergies: Wellbutrin [bupropion]  Current Medications:  Current Outpatient Medications:    acetaZOLAMIDE (DIAMOX) 250 MG tablet, TAKE 1  TABLET BY MOUTH TWICE A DAY, Disp: 180 tablet, Rfl: 1   botulinum toxin Type A (BOTOX) 200 units injection, Inject 155 units IM into multiple site in the face,neck and head once every 90 days, Disp: 1 each, Rfl: 4   cyanocobalamin (VITAMIN B12) 500 MCG tablet, Take 500 mcg by mouth daily., Disp: , Rfl:    FLUoxetine (PROZAC) 40 MG capsule, Take 2 capsules (80 mg total) by mouth daily., Disp: 180 capsule, Rfl: 3   LORazepam (ATIVAN) 1 MG tablet, 1 po qd prn and 2 po qhs. (Patient taking differently: Take 1 mg by mouth at bedtime.  2 po qhs.), Disp: 90 tablet, Rfl: 5   Lurasidone HCl 60 MG TABS, Take 1 tablet (60 mg total) by mouth daily with supper., Disp: 30 tablet, Rfl: 1   Rimegepant Sulfate (NURTEC) 75 MG TBDP, Take 1 tablet by mouth daily as needed., Disp: 16 tablet, Rfl: 5   tretinoin (RETIN-A) 0.1 % cream, SMARTSIG:Topical Every Evening, Disp: , Rfl:    acetaZOLAMIDE (DIAMOX) 250 MG tablet, TAKE 1 TABLET BY MOUTH TWICE A DAY, Disp: 60 tablet, Rfl: 0 Medication Side Effects: headache possibly from Wellbutrin, now or not sure if it was coincidental.  Family Medical/ Social History: Changes? no  MENTAL HEALTH EXAM:  There were no vitals taken for this visit.There is no height or weight on file to calculate BMI.  General Appearance: Casual, Well Groomed, and looks tired  Eye Contact:  Good  Speech:  Clear and Coherent and Normal Rate  Volume:  Normal  Mood:  Euthymic  Affect:  Congruent  Thought Process:  Goal Directed and Descriptions of Associations: Circumstantial  Orientation:  Full (Time, Place, and Person)  Thought Content: Logical   Suicidal Thoughts:  No  Homicidal Thoughts:  No  Memory:  WNL  Judgement:  Good  Insight:  Good  Psychomotor Activity:  Normal  Concentration:  Concentration: Good and Attention Span: Good  Recall:  Good  Fund of Knowledge: Good  Language: Good  Assets:  Desire for Improvement Financial Resources/Insurance Housing Transportation  ADL's:   Intact  Cognition: WNL  Prognosis:  Good   Labs 04/01/2022 CBC with differential, CMP, B12, and CRP reviewed.  See results on chart.  GeneSight test results are in chart had moderately reduced folic acid conversion  DIAGNOSES:    ICD-10-CM   1. Major depressive disorder, recurrent episode, moderate (HCC)  F33.1 Lipid panel    TSH    VITAMIN D 25 Hydroxy (Vit-D Deficiency, Fractures)    2. Malaise and fatigue  R53.81 Lipid panel   R53.83 TSH    VITAMIN D 25 Hydroxy (Vit-D Deficiency, Fractures)    3. Generalized anxiety disorder  F41.1     4. Insomnia, unspecified type  G47.00     5. Encounter for long-term (current) use of medications  Z79.899 Lipid panel    TSH    VITAMIN D 25 Hydroxy (Vit-D Deficiency, Fractures)      Receiving Psychotherapy: No   RECOMMENDATIONS:  PDMP was reviewed.  Ativan filled 04/14/2022. I provided 30 minutes of face to face time during this encounter, including time spent before and after the visit in records review, medical decision making, counseling pertinent to today's visit, and charting.   We discussed the anhedonia.  I recommend increasing Latuda.  It has been helpful but just not enough so she would like to make this change. She has not had a TSH in a few years so I recommend drawing that plus vitamin D level to help evaluate the fatigue.  Also needs lipid panel because of the antipsychotic.  Continue Prozac 80 mg daily. Continue Ativan 1 mg, 1 po tid as needed. Increase Latuda to 60 mg p.o. daily with food. Labs ordered as above. Return in 4 weeks.  Donnal Moat, PA-C

## 2022-05-19 ENCOUNTER — Ambulatory Visit: Payer: BC Managed Care – PPO | Admitting: Neurology

## 2022-05-24 ENCOUNTER — Telehealth: Payer: Self-pay | Admitting: Physician Assistant

## 2022-05-24 NOTE — Telephone Encounter (Signed)
Patient lvm stating the Lurasidone she is currently on is making her more anxious/restless and tense. Appointment scheduled for 12/29.  Please advise # [phone]

## 2022-05-24 NOTE — Telephone Encounter (Signed)
LVM to RC 

## 2022-05-24 NOTE — Telephone Encounter (Signed)
Sounds like she has akathisia, several options for treatment: 1) add Mirtazapine, take at night, it's an antidepressant but used for sleep and akathisia. 2) decrease dose of Latuda or 3) d/c Latuda, see how she's does on Prozac alone, or add another atypical antipsychotic. Theres no one right way. My recommendation would be to add the mirtazapine because I think the Anette Guarneri has helped with the depression.  Let me know her wishes.  Thank you.

## 2022-05-24 NOTE — Telephone Encounter (Signed)
See message from patient. Called her and she said it was difficult to describe, but said she can't sit still and relax and laying down is no better. She has been on the increased dose of Latuda for about a week, but says she has had sx prior to that. She said she usually takes medication with supper, but sometimes eats earlier, around 3-4, and will take medication then. She said sx seemed to be worse when she takes earlier. She also mentions having difficulty getting to sleep and sometimes will wake during the night. No other complaints.

## 2022-05-25 MED ORDER — MIRTAZAPINE 15 MG PO TABS: 15.0000 mg | ORAL_TABLET | Freq: Every day | ORAL | 0 refills | Status: DC

## 2022-05-25 NOTE — Telephone Encounter (Signed)
Noted thanks I will send that in for her.

## 2022-05-25 NOTE — Telephone Encounter (Signed)
Rx for Mirtazapine 15 mg take 1 tablet at bedtime, if too sedating can decrease to 1/2 tablet.

## 2022-05-25 NOTE — Telephone Encounter (Signed)
Patient with potential akathisia. Recommendations made by Helene Kelp and patient is opting to try mirtazapine. I don't know what dose. Pharmacy is [facility] [address].

## 2022-06-06 ENCOUNTER — Other Ambulatory Visit: Payer: Self-pay | Admitting: Physician Assistant

## 2022-06-17 ENCOUNTER — Ambulatory Visit: Payer: BC Managed Care – PPO | Admitting: Physician Assistant

## 2022-06-17 ENCOUNTER — Other Ambulatory Visit: Payer: Self-pay | Admitting: Physician Assistant

## 2022-06-20 ENCOUNTER — Other Ambulatory Visit: Payer: Self-pay | Admitting: Physician Assistant

## 2022-07-10 ENCOUNTER — Other Ambulatory Visit: Payer: Self-pay | Admitting: Physician Assistant

## 2022-07-11 NOTE — Telephone Encounter (Signed)
Filled 12/10

## 2022-07-12 ENCOUNTER — Other Ambulatory Visit: Payer: Self-pay | Admitting: Obstetrics and Gynecology

## 2022-07-12 DIAGNOSIS — Z1231 Encounter for screening mammogram for malignant neoplasm of breast: Secondary | ICD-10-CM

## 2022-07-15 ENCOUNTER — Other Ambulatory Visit: Payer: Self-pay | Admitting: Physician Assistant

## 2022-07-19 ENCOUNTER — Other Ambulatory Visit (HOSPITAL_COMMUNITY): Payer: Self-pay

## 2022-07-19 ENCOUNTER — Other Ambulatory Visit: Payer: Self-pay | Admitting: Physician Assistant

## 2022-07-25 ENCOUNTER — Other Ambulatory Visit: Payer: Self-pay

## 2022-07-25 ENCOUNTER — Ambulatory Visit: Payer: BC Managed Care – PPO | Admitting: Family Medicine

## 2022-07-25 ENCOUNTER — Encounter: Payer: Self-pay | Admitting: Family Medicine

## 2022-07-25 VITALS — BP 120/70 | HR 85 | Temp 98.5°F | Resp 12 | Ht 64.0 in | Wt 220.4 lb

## 2022-07-25 DIAGNOSIS — E559 Vitamin D deficiency, unspecified: Secondary | ICD-10-CM | POA: Diagnosis not present

## 2022-07-25 DIAGNOSIS — Z6835 Body mass index (BMI) 35.0-35.9, adult: Secondary | ICD-10-CM

## 2022-07-25 DIAGNOSIS — R635 Abnormal weight gain: Secondary | ICD-10-CM | POA: Diagnosis not present

## 2022-07-25 DIAGNOSIS — R7401 Elevation of levels of liver transaminase levels: Secondary | ICD-10-CM

## 2022-07-25 DIAGNOSIS — F332 Major depressive disorder, recurrent severe without psychotic features: Secondary | ICD-10-CM | POA: Diagnosis not present

## 2022-07-25 NOTE — Assessment & Plan Note (Signed)
She understands the benefits of wt loss as well as adverse effects of obesity. Consistency with healthy diet and physical activity encouraged. 

## 2022-07-25 NOTE — Progress Notes (Unsigned)
ACUTE VISIT Chief Complaint  Patient presents with   Weight Gain    Has gained about 20-30 pounds since the fall, wanted to know if it is food related or something more.   HPI: [name] is a 47 y.o. female, who is here today concerned about "significant" weight gain, reporting an increase of 20 to 30 pounds since the last fall.  She denies any changes in oral intake or physical activity. She wants to be sure wt gain is not caused by a new health problem.  Depression and anxiety: Mirtazapine 15 mg started last year around 03/2022, which has helped with symptoms. She is also taking fluoxetine 40 mg , which she has taken for a long time.  She is not exercising regularly and has not been consistent with following a healthful diet. She is not motivated to make positive life style changes.  She has tried Weight Watchers in the past with some success.  She describes her current eating habits as not excessive but acknowledges consuming unhealthy food items such as pizza and hamburgers.  Her depression has improved, and she reports better sleep and having fewer migraines, the latter one she attributes to Botox treatments.   Lab Results  Component Value Date   TSH 1.39 06/11/2020   Lab Results  Component Value Date   HGBA1C 5.2 12/10/2019   Review of Systems  Constitutional:  Negative for activity change, appetite change and fever.  Eyes:  Negative for redness and visual disturbance.  Respiratory:  Negative for cough, shortness of breath and wheezing.   Cardiovascular:  Negative for chest pain and leg swelling.  Gastrointestinal:  Negative for abdominal pain, nausea and vomiting.       Negative for changes in bowel habits.  Endocrine: Negative for cold intolerance, heat intolerance, polydipsia, polyphagia and polyuria.  Genitourinary:  Negative for decreased urine volume, dysuria and hematuria.  Skin:  Negative for rash.  Neurological:  Negative for syncope and  weakness.  See other pertinent positives and negatives in HPI.  Current Outpatient Medications on File Prior to Visit  Medication Sig Dispense Refill   acetaZOLAMIDE (DIAMOX) 250 MG tablet TAKE 1 TABLET BY MOUTH TWICE A DAY 180 tablet 1   botulinum toxin Type A (BOTOX) 200 units injection Inject 155 units IM into multiple site in the face,neck and head once every 90 days 1 each 4   cyanocobalamin (VITAMIN B12) 500 MCG tablet Take 500 mcg by mouth daily.     FLUoxetine (PROZAC) 40 MG capsule Take 2 capsules (80 mg total) by mouth daily. 180 capsule 3   LORazepam (ATIVAN) 1 MG tablet TAKE 1 TABLET DAILY AS NEEDED AT 2 TABLETS AT BEDTIME 90 tablet 2   Lurasidone HCl 60 MG TABS TAKE 1 TABLET BY MOUTH EVERY DAY WITH SUPPER 30 tablet 1   mirtazapine (REMERON) 15 MG tablet TAKE 1 TABLET BY MOUTH EVERYDAY AT BEDTIME 90 tablet 0   Rimegepant Sulfate (NURTEC) 75 MG TBDP Take 1 tablet by mouth daily as needed. 16 tablet 5   tretinoin (RETIN-A) 0.1 % cream SMARTSIG:Topical Every Evening     No current facility-administered medications on file prior to visit.   Past Medical History:  Diagnosis Date   Anxiety    Blood transfusion without reported diagnosis    Depression    Frequent headaches    GERD (gastroesophageal reflux disease)    Heart murmur    Migraine    UTI (lower urinary tract infection)  Allergies  Allergen Reactions   Wellbutrin [Bupropion] Other (See Comments)    Worsened migraines   Social History   Socioeconomic History   Marital status: Married    Spouse name: Not on file   Number of children: 1   Years of education: Not on file   Highest education level: Doctorate  Occupational History    Comment: college counselor and admissions counselor  Tobacco Use   Smoking status: Never   Smokeless tobacco: Never  Vaping Use   Vaping Use: Never used  Substance and Sexual Activity   Alcohol use: Yes    Alcohol/week: 1.0 standard drink of alcohol    Types: 1 Glasses of  wine per week    Comment: maybe 1-2 monthly   Drug use: No   Sexual activity: Yes    Partners: Male    Birth control/protection: Surgical  Other Topics Concern   Not on file  Social History Narrative   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    Remarried last year, husband is Astronomer.   She and ex husband coparent their almost 63 yo daughter.   She grew up in [place], went to [employer], married 1st husband, traumatic birth see HPI.       Caffeine-no more than 1 per day, and not over 4 days a week.    Legal-bancruptcy when she got divorced.    Religion-Quaker.   Right handed   Social Determinants of Health   Financial Resource Strain: Low Risk  (03/30/2022)   Overall Financial Resource Strain (CARDIA)    Difficulty of Paying Living Expenses: Not hard at all  Food Insecurity: No Food Insecurity (03/30/2022)   Hunger Vital Sign    Worried About Running Out of Food in the Last Year: Never true    Ran Out of Food in the Last Year: Never true  Transportation Needs: No Transportation Needs (03/30/2022)   PRAPARE - Hydrologist (Medical): No    Lack of Transportation (Non-Medical): No  Physical Activity: Insufficiently Active (03/30/2022)   Exercise Vital Sign    Days of Exercise per Week: 3 days    Minutes of Exercise per Session: 30 min  Stress: Stress Concern Present (03/30/2022)   [place]    Feeling of Stress : To some extent  Social Connections: Moderately Integrated (03/30/2022)   Social Connection and Isolation Panel [NHANES]    Frequency of Communication with Friends and Family: More than three times a week    Frequency of Social Gatherings with Friends and Family: Three times a week    Attends Religious Services: 1 to 4 times per year    Active Member of Clubs or Organizations: No    Attends English as a second language teacher Meetings: Not on file    Marital Status: Married   Vitals:   07/25/22 1516  BP: 120/70  Pulse: 85  Resp: 12  Temp: 98.5 F (36.9 C)  SpO2: 96%   Wt Readings from Last 3 Encounters:  07/25/22 220 lb 6 oz (100 kg)  03/30/22 197 lb (89.4 kg)  03/23/22 194 lb 6.4 oz (88.2 kg)   Body mass index is 37.83 kg/m.  Physical Exam Vitals and nursing note reviewed.  Constitutional:      General: She is not in acute distress.    Appearance: She is well-developed.  HENT:     Head:  Normocephalic and atraumatic.     Mouth/Throat:     Mouth: Mucous membranes are moist.     Pharynx: Oropharynx is clear.  Eyes:     Conjunctiva/sclera: Conjunctivae normal.  Cardiovascular:     Rate and Rhythm: Normal rate and regular rhythm.     Heart sounds: No murmur heard. Pulmonary:     Effort: Pulmonary effort is normal. No respiratory distress.     Breath sounds: Normal breath sounds.  Abdominal:     Palpations: Abdomen is soft. There is no hepatomegaly or mass.     Tenderness: There is no abdominal tenderness.  Musculoskeletal:     Right lower leg: No edema.     Left lower leg: No edema.  Lymphadenopathy:     Cervical: No cervical adenopathy.  Skin:    General: Skin is warm.     Findings: No erythema or rash.  Neurological:     General: No focal deficit present.     Mental Status: She is alert and oriented to person, place, and time.     Cranial Nerves: No cranial nerve deficit.     Gait: Gait normal.  Psychiatric:        Mood and Affect: Mood and affect normal.   ASSESSMENT AND PLAN: Ms. Dhanvi was seen today for weight gain.  Diagnoses and all orders for this visit: Lab Results  Component Value Date   CREATININE 0.86 07/25/2022   BUN 21 07/25/2022   NA 138 07/25/2022   K 3.6 07/25/2022   CL 104 07/25/2022   CO2 16 (L) 07/25/2022   Lab Results  Component Value Date   ALT 35 07/25/2022   AST 65 (H) 07/25/2022   ALKPHOS 94 07/25/2022   BILITOT 0.4  07/25/2022   Lab Results  Component Value Date   TSH 2.33 07/25/2022   Weight gain Possible causes discussed. Further recommendations will be given according to lab results.  Severe obesity (BMI 35.0-39.9) with comorbidity ([place]) She has gained about 26 Lb since 03/2022,Mirtazapine can definitively be a contributing factor. She understands the benefits of wt loss as well as adverse effects of obesity. Consistency with healthy diet and physical activity encouraged. She would like to establish with wt loss clinic.  Major depressive disorder, recurrent episode (HCC) Currently on Fluoxetine 40 mg daily and Mirtazapine 15 mg daily. Problem is better controlled since Mirtazapine was added, some side effects discussed. Following with psychiatrist.  Vitamin D deficiency, unspecified Will start Ergocalciferol 50,000 U weekly x 8 then continue with OTC Vit D 2000 U daily. 25 OH vit D can be repeated in 4 months.  Return if symptoms worsen or fail to improve, for keep next appointment.  Otniel Hoe G. Martinique, MD  [facility]. [place] office.

## 2022-07-25 NOTE — Patient Instructions (Addendum)
A few things to remember from today's visit:  Weight gain - Plan: Comprehensive metabolic panel, TSH  Severe episode of recurrent major depressive disorder, without psychotic features ([place]) - Plan: VITAMIN D 25 Hydroxy (Vit-D Deficiency, Fractures)  Severe obesity (BMI 35.0-39.9) with comorbidity ([place]) - Plan: Amb Ref to Medical Weight Management MyPlate from [place] is an outline of a general healthy diet based on the Dietary Guidelines for Americans, 2020-2025, from the U.S. Department of Agriculture Scientist, research (physical sciences)). It sets guidelines for how much food you should eat from each food group based on your age, sex, and level of physical activity. What are tips for following MyPlate? To follow MyPlate recommendations: Eat a wide variety of fruits and vegetables, grains, and protein foods. Serve smaller portions and eat less food throughout the day. Limit portion sizes to avoid overeating. Enjoy your food. Get at least 150 minutes of exercise every week. This is about 30 minutes each day, 5 or more days per week. It can be difficult to have every meal look like MyPlate. Think about MyPlate as eating guidelines for an entire day, rather than each individual meal. Fruits and vegetables Make one half of your plate fruits and vegetables. Eat many different colors of fruits and vegetables each day. For a 2,000-calorie daily food plan, eat: 2 cups of vegetables every day. 2 cups of fruit every day. 1 cup is equal to: 1 cup raw or cooked vegetables. 1 cup raw fruit. 1 medium-sized orange, apple, or banana. 1 cup 100% fruit or vegetable juice. 2 cups raw leafy greens, such as lettuce, spinach, or kale.  cup dried fruit. Grains One fourth of your plate should be grains. Make at least half of the grains you eat each day whole grains. For a 2,000-calorie daily food plan, eat 6 oz of grains every day. 1 oz is equal to: 1 slice bread. 1 cup cereal.  cup cooked rice, cereal, or  pasta. Protein One fourth of your plate should be protein. Eat a wide variety of protein foods, including meat, poultry, fish, eggs, beans, nuts, and tofu. For a 2,000-calorie daily food plan, eat 5 oz of protein every day. 1 oz is equal to: 1 oz meat, poultry, or fish.  cup cooked beans. 1 egg.  oz nuts or seeds. 1 Tbsp peanut butter. Dairy Drink fat-free or low-fat (1%) milk. Eat or drink dairy as a side to meals. For a 2,000-calorie daily food plan, eat or drink 3 cups of dairy every day. 1 cup is equal to: 1 cup milk, yogurt, cottage cheese, or soy milk (soy beverage). 2 oz processed cheese. 1 oz natural cheese. Fats, oils, salt, and sugars Only small amounts of oils are recommended. Avoid foods that are high in calories and low in nutritional value (empty calories), like foods high in fat or added sugars. Choose foods that are low in salt (sodium). Choose foods that have less than 140 milligrams (mg) of sodium per serving. Drink water instead of sugary drinks. Drink enough fluid to keep your urine pale yellow. Where to find support Work with your health care provider or a dietitian to develop a customized eating plan that is right for you. Download an app (mobile application) to help you track your daily food intake. Where to find more information USDA: [employer] Summary MyPlate is a general guideline for healthy eating from the USDA. It is based on the Dietary Guidelines for Americans, 2020-2025. In general, fruits and vegetables should take up one half of your  plate, grains should take up one fourth of your plate, and protein should take up one fourth of your plate. This information is not intended to replace advice given to you by your health care provider. Make sure you discuss any questions you have with your health care provider. Document Revised: 04/27/2020 Document Reviewed: 04/27/2020 Elsevier Patient Education  [place].  If you need refills  for medications you take chronically, please call your pharmacy. Do not use My Chart to request refills or for acute issues that need immediate attention. If you send a my chart message, it may take a few days to be addressed, specially if I am not in the office.  Please be sure medication list is accurate. If a new problem present, please set up appointment sooner than planned today.

## 2022-07-26 ENCOUNTER — Other Ambulatory Visit: Payer: Self-pay

## 2022-07-26 LAB — COMPREHENSIVE METABOLIC PANEL
ALT: 35 U/L (ref 0–35)
AST: 65 U/L — ABNORMAL HIGH (ref 0–37)
Albumin: 4.4 g/dL (ref 3.5–5.2)
Alkaline Phosphatase: 94 U/L (ref 39–117)
BUN: 21 mg/dL (ref 6–23)
CO2: 16 mEq/L — ABNORMAL LOW (ref 19–32)
Calcium: 9.2 mg/dL (ref 8.4–10.5)
Chloride: 104 mEq/L (ref 96–112)
Creatinine, Ser: 0.86 mg/dL (ref 0.40–1.20)
GFR: 81.85 mL/min (ref >60.00)
Glucose, Bld: 74 mg/dL (ref 70–99)
Potassium: 3.6 mEq/L (ref 3.5–5.1)
Sodium: 138 mEq/L (ref 135–145)
Total Bilirubin: 0.4 mg/dL (ref 0.2–1.2)
Total Protein: 7.6 g/dL (ref 6.0–8.3)

## 2022-07-26 LAB — VITAMIN D 25 HYDROXY (VIT D DEFICIENCY, FRACTURES): VITD: 7.42 ng/mL — ABNORMAL LOW (ref 30.00–100.00)

## 2022-07-26 LAB — TSH: TSH: 2.33 u[IU]/mL (ref 0.35–5.50)

## 2022-07-28 DIAGNOSIS — E559 Vitamin D deficiency, unspecified: Secondary | ICD-10-CM | POA: Insufficient documentation

## 2022-07-28 MED ORDER — VITAMIN D (ERGOCALCIFEROL) 1.25 MG (50000 UNIT) PO CAPS: 50000.0000 [IU] | ORAL_CAPSULE | ORAL | 0 refills | Status: AC

## 2022-07-28 NOTE — Assessment & Plan Note (Signed)
Currently on Fluoxetine 40 mg daily and Mirtazapine 15 mg daily. Problem is better controlled since Mirtazapine was added, some side effects discussed. Following with psychiatrist.

## 2022-07-28 NOTE — Assessment & Plan Note (Signed)
Will start Ergocalciferol 50,000 U weekly x 8 then continue with OTC Vit D 2000 U daily. 25 OH vit D can be repeated in 4 months.

## 2022-08-02 ENCOUNTER — Ambulatory Visit: Payer: BC Managed Care – PPO | Admitting: Physician Assistant

## 2022-08-02 ENCOUNTER — Encounter: Payer: Self-pay | Admitting: Physician Assistant

## 2022-08-02 VITALS — BP 110/70 | HR 76 | Wt 218.6 lb

## 2022-08-02 DIAGNOSIS — R5381 Other malaise: Secondary | ICD-10-CM

## 2022-08-02 DIAGNOSIS — F411 Generalized anxiety disorder: Secondary | ICD-10-CM | POA: Diagnosis not present

## 2022-08-02 DIAGNOSIS — F5105 Insomnia due to other mental disorder: Secondary | ICD-10-CM | POA: Diagnosis not present

## 2022-08-02 DIAGNOSIS — R635 Abnormal weight gain: Secondary | ICD-10-CM

## 2022-08-02 DIAGNOSIS — F3341 Major depressive disorder, recurrent, in partial remission: Secondary | ICD-10-CM | POA: Diagnosis not present

## 2022-08-02 DIAGNOSIS — F99 Mental disorder, not otherwise specified: Secondary | ICD-10-CM

## 2022-08-02 NOTE — Progress Notes (Signed)
[facility] Check  Patient ID: Yolanda Wolfe,  MRN: [id number]  PCP: Martinique, Betty G, MD  Date of Evaluation: 08/02/2022  Time spent:30 minutes  Chief Complaint:  Chief Complaint   Depression; Anxiety; Follow-up    HISTORY/CURRENT STATUS: HPI  For routine med check.  Has gained 25# in 2 months or so. Very discouraging. Feels good mentally though, and not having anymore of the internal jittery feelings, like she can't sit still.   Patient is able to enjoy things.  Loves riding her horse. She still stays tired a lot. But no worse.  Work is going well.   No extreme sadness, tearfulness, or feelings of hopelessness.  Sleeps ok.  ADLs and personal hygiene are normal.   Denies any changes in concentration, making decisions, or remembering things.  Appetite has increased.  Anxiety is well controlled. No PA. Denies suicidal or homicidal thoughts.  Patient denies increased energy with decreased need for sleep, increased talkativeness, racing thoughts, impulsivity or risky behaviors, increased spending, increased libido, grandiosity, increased irritability or anger, paranoia, or hallucinations.  Denies dizziness, syncope, seizures, numbness, tingling, tremor, tics, unsteady gait, slurred speech, confusion. Denies muscle or joint pain, stiffness, or dystonia.   Individual Medical History/ Review of Systems: Changes? :Yes    wt gain, low Vit D. Will start weekly supplement soon.   Past medications for mental health diagnoses include: Amitriptylline for sleep? Celexa, Lunesta, Ativan, Zoloft, Belsomra, Temazapam Topamax for migraines, Wellbutrin caused migraines to worsen, trazodone not effective at low doses but at 100 mg she was too groggy, Mirtazapine caused extreme wt gain  Allergies: Wellbutrin [bupropion]  Current Medications:  Current Outpatient Medications:    acetaZOLAMIDE (DIAMOX) 250 MG tablet, TAKE 1 TABLET BY MOUTH TWICE A DAY, Disp: 180 tablet, Rfl: 1    botulinum toxin Type A (BOTOX) 200 units injection, Inject 155 units IM into multiple site in the face,neck and head once every 90 days, Disp: 1 each, Rfl: 4   cyanocobalamin (VITAMIN B12) 500 MCG tablet, Take 500 mcg by mouth daily., Disp: , Rfl:    FLUoxetine (PROZAC) 40 MG capsule, Take 2 capsules (80 mg total) by mouth daily., Disp: 180 capsule, Rfl: 3   LORazepam (ATIVAN) 1 MG tablet, TAKE 1 TABLET DAILY AS NEEDED AT 2 TABLETS AT BEDTIME, Disp: 90 tablet, Rfl: 2   Lurasidone HCl 60 MG TABS, TAKE 1 TABLET BY MOUTH EVERY DAY WITH SUPPER, Disp: 30 tablet, Rfl: 1   Rimegepant Sulfate (NURTEC) 75 MG TBDP, Take 1 tablet by mouth daily as needed., Disp: 16 tablet, Rfl: 5   tretinoin (RETIN-A) 0.1 % cream, SMARTSIG:Topical Every Evening (Patient not taking: Reported on 08/02/2022), Disp: , Rfl:    Vitamin D, Ergocalciferol, (DRISDOL) 1.25 MG (50000 UNIT) CAPS capsule, Take 1 capsule (50,000 Units total) by mouth every 7 (seven) days for 8 doses., Disp: 8 capsule, Rfl: 0 Medication Side Effects: headache possibly from Wellbutrin, now or not sure if it was coincidental. Extreme wt gain from Mirtazapine.   Family Medical/ Social History: Changes? no  MENTAL HEALTH EXAM:  Blood pressure 110/70, pulse 76, weight 218 lb 9.6 oz (99.2 kg).Body mass index is 37.52 kg/m.  General Appearance: Casual, Well Groomed, and Obese  Eye Contact:  Good  Speech:  Clear and Coherent and Normal Rate  Volume:  Normal  Mood:  Euthymic  Affect:  Congruent  Thought Process:  Goal Directed and Descriptions of Associations: Circumstantial  Orientation:  Full (Time, Place, and Person)  Thought Content: Logical  Suicidal Thoughts:  No  Homicidal Thoughts:  No  Memory:  WNL  Judgement:  Good  Insight:  Good  Psychomotor Activity:  Normal  Concentration:  Concentration: Good and Attention Span: Good  Recall:  Good  Fund of Knowledge: Good  Language: Good  Assets:  Desire for Improvement Financial  Resources/Insurance Housing Transportation  ADL's:  Intact  Cognition: WNL  Prognosis:  Good   Labs 07/25/2022 CMP and TSH nl Vit D 7.42  Dr. Martinique is following labs.   [facility] test results are in chart had moderately reduced folic acid conversion  DIAGNOSES:    ICD-10-CM   1. Depression, major, recurrent, in partial remission ([place])  F33.41     2. Generalized anxiety disorder  F41.1     3. Weight gain  R63.5     4. Insomnia due to other mental disorder  F51.05    F99     5. Malaise and fatigue  R53.81    R53.83      Receiving Psychotherapy: No   RECOMMENDATIONS:  PDMP was reviewed.  Ativan filled 07/11/2022. I provided 30 minutes of face to face time during this encounter, including time spent before and after the visit in records review, medical decision making, counseling pertinent to today's visit, and charting.   Weight gain started about the time we started Mirtazapine for akathisia. Will wean off that, 1/2 pill (7.5 mg) for 3-4 nights, then stop.  If insomnia worsens, call. I'll send Ambien, Lunesta, or Sonata for temporary use.  If akathisia or RLS recur, will give Propranolol. We've already discussed this, possible SE and benefits for akathisia. She knows to watch for dizziness, feeling faint, caution with orthostatic hypotension.   Continue Prozac 80 mg daily. Continue Ativan 1 mg, 1 po tid as needed. Continue Latuda 60 mg p.o. daily with food. Return in 6 weeks.  Donnal Moat, PA-C

## 2022-08-05 ENCOUNTER — Ambulatory Visit: Payer: BC Managed Care – PPO | Admitting: Neurology

## 2022-08-05 DIAGNOSIS — G43709 Chronic migraine without aura, not intractable, without status migrainosus: Secondary | ICD-10-CM

## 2022-08-05 MED ORDER — ONABOTULINUMTOXINA 100 UNITS IJ SOLR
200.0000 [IU] | Freq: Once | INTRAMUSCULAR | Status: AC
  Administered 2022-08-05: 155 [IU] via INTRAMUSCULAR

## 2022-08-05 NOTE — Progress Notes (Signed)
[facility]  ° °Procedure Note Botox ° °Attending: Dr. Media Pizzini ° °Preoperative Diagnosis(es): Chronic migraine ° °Consent obtained from: The patient °Benefits discussed included, but were not limited to decreased muscle tightness, increased joint range of motion, and decreased pain.  Risk discussed included, but were not limited pain and discomfort, bleeding, bruising, excessive weakness, venous thrombosis, muscle atrophy and dysphagia.  Anticipated outcomes of the procedure as well as he risks and benefits of the alternatives to the procedure, and the roles and tasks of the personnel to be involved, were discussed with the patient, and the patient consents to the procedure and agrees to proceed. A copy of the patient medication guide was given to the patient which explains the blackbox warning. ° °Patients identity and treatment sites confirmed Yes.  . ° °Details of Procedure: °Skin was cleaned with alcohol. Prior to injection, the needle plunger was aspirated to make sure the needle was not within a blood vessel.  There was no blood retrieved on aspiration.   ° °Following is a summary of the muscles injected  And the amount of Botulinum toxin used: ° °Dilution °200 units of Botox was reconstituted with 4 ml of preservative free normal saline. °Time of reconstitution: At the time of the office visit (<30 minutes prior to injection)  ° °Injections  °155 total units of Botox was injected with a 30 gauge needle. ° °Injection Sites: °L occipitalis: 15 units- 3 sites  °R occiptalis: 15 units- 3 sites ° °L upper trapezius: 15 units- 3 sites °R upper trapezius: 15 units- 3 sits          °L paraspinal: 10 units- 2 sites °R paraspinal: 10 units- 2 sites ° °Face °L frontalis(2 injection sites):10 units   °R frontalis(2 injection sites):10 units         °L corrugator: 5 units   °R corrugator: 5 units           °Procerus: 5 units   °L temporalis: 20 units °R temporalis: 20 units  ° °Agent:  °200 units of botulinum Type  A (Onobotulinum Toxin type A) was reconstituted with 4 ml of preservative free normal saline.  °Time of reconstitution: At the time of the office visit (<30 minutes prior to injection)  ° ° ° Total injected (Units):  155 ° Total wasted (Units):  45 ° °Patient tolerated procedure well without complications.   °Reinjection is anticipated in 3 months. ° ° °

## 2022-08-08 ENCOUNTER — Telehealth: Payer: Self-pay | Admitting: Physician Assistant

## 2022-08-08 ENCOUNTER — Other Ambulatory Visit: Payer: Self-pay | Admitting: Physician Assistant

## 2022-08-08 MED ORDER — ZALEPLON 10 MG PO CAPS: ORAL_CAPSULE | ORAL | 1 refills | Status: DC

## 2022-08-08 NOTE — Telephone Encounter (Signed)
I sent in Yolanda Wolfe. She can take 1 at bedtime as needed for sleep but if she wakes up in the middle of the night and cannot go back to sleep she can take another 1.  She must have 3 to 4 hours left to sleep though.

## 2022-08-08 NOTE — Telephone Encounter (Signed)
Pt LVM @ 11:58a.  She said Helene Kelp weaned her off a medicine last week.  She said now she is having trouble sleeping.  She would like something called in for sleep.  Next appt 3/26

## 2022-08-08 NOTE — Telephone Encounter (Signed)
Mirtazapine was stopped due to weight gain. I reviewed sleep hygiene with patient, no issues to improve on.   Past sleep meds. Amitriptylline for sleep?  Lunesta, Ativan, Belsomra, Temazapam.  I pulled this from your note. She said she didn't think Ambien worked.   [facility] [address]

## 2022-08-09 ENCOUNTER — Telehealth: Payer: Self-pay | Admitting: Physician Assistant

## 2022-08-09 NOTE — Telephone Encounter (Signed)
Pt LVM @ 10:58a.  She said Helene Kelp prescribed Sonata for her recently.  She said the pharmacist told her if she take it with the Lorazepam at night, she may have an overdose.  She wants to know when she should take the Lorazepam or should she discontinue it altogether.  Next appt 3/26

## 2022-08-09 NOTE — Telephone Encounter (Signed)
Left message with information.

## 2022-08-09 NOTE — Telephone Encounter (Signed)
I thought about that, but feel that it would be safe for short term, while her body is getting accustomed to being off the mirtazapine.  Another completely different option would be to change Ativan to Xanax or Klonopin, (I do not think she has ever taken either 1 of those) which are usually more sedating than the Ativan.  If we did that we would hold the Sonata.

## 2022-08-09 NOTE — Telephone Encounter (Signed)
This was addressed on a different note.

## 2022-08-09 NOTE — Telephone Encounter (Signed)
Called patient about Sonata. She is also on lorazepam. The pharmacist told her about taking the two together. Please advise.

## 2022-08-16 ENCOUNTER — Telehealth: Payer: Self-pay | Admitting: Physician Assistant

## 2022-08-16 NOTE — Telephone Encounter (Signed)
Per medication history has tried amitriptyline 2021, Lunesta 08/2015-09/2016, mirtazapine was discontinued due to SE, Belsoma 2015-2018, temazepam 2017-2019, trazodone 08/2021-09/2021, Sonata currently

## 2022-08-16 NOTE — Telephone Encounter (Signed)
Patient lvm regarding her recent med change Sonata that she has been taking with Lorazepam and "Its not helping at all. Sleep is worse that what is was before." She would like to know of any suggestions to help or is she needs a dosage change. Ph: [phone] Appt 3/26

## 2022-08-17 NOTE — Telephone Encounter (Signed)
We can do one of two things; change Ativan to Xanax b/c it's stronger and may work better for sleep, or change Sonata to Ambien. Please disc w/ her, pend whichever she is most comfortable with, Xanax would be 1 mg, 60 no RF. Ambien would be 10 mg #30 w/ no RF. She'll have to bring the remainder of current supply of Sonata and/or Ativan to [place] for proper disposal, preferably one day by the end of the week. If she can't, I trust her to bring it in at next OV. And cancel the RF on either Sonata or Ativan. Thanks

## 2022-08-17 NOTE — Telephone Encounter (Signed)
Patient still complaining of not being able to sleep. See message. I went thru her med history and listed all the previously tried meds.   [facility] [address].

## 2022-08-17 NOTE — Telephone Encounter (Signed)
LVM to RC 

## 2022-08-18 ENCOUNTER — Other Ambulatory Visit: Payer: Self-pay

## 2022-08-18 NOTE — Telephone Encounter (Signed)
Recommendations provided to the patient. She elects to change Ativan to Xanax and try that before switching sleep medication. Pended Rx.

## 2022-08-19 MED ORDER — ALPRAZOLAM 1 MG PO TABS: 2.0000 mg | ORAL_TABLET | Freq: Every day | ORAL | 0 refills | Status: DC

## 2022-08-19 NOTE — Telephone Encounter (Signed)
No, she doesn't need to bring Lorazepam. I'll go ahead and send this. Thanks.

## 2022-08-30 ENCOUNTER — Ambulatory Visit
Admission: RE | Admit: 2022-08-30 | Discharge: 2022-08-30 | Disposition: A | Discharge status: Home / Self Care | Payer: BC Managed Care – PPO | Source: Ambulatory Visit | Attending: Obstetrics and Gynecology | Admitting: Obstetrics and Gynecology

## 2022-08-30 DIAGNOSIS — Z1231 Encounter for screening mammogram for malignant neoplasm of breast: Secondary | ICD-10-CM

## 2022-09-09 ENCOUNTER — Telehealth: Payer: Self-pay | Admitting: Pharmacy Technician

## 2022-09-09 ENCOUNTER — Other Ambulatory Visit (HOSPITAL_COMMUNITY): Payer: Self-pay

## 2022-09-09 NOTE — Telephone Encounter (Signed)
Botox[employer] verification has been submitted. Benefit Verification:   [id number]  Pharmacy PA has been submitted for BOTOX 200 via FAX. INSURANCE: [employer] DATE SUBMITTED: 3.22.24 FAX: [phone] Status is pending

## 2022-09-09 NOTE — Telephone Encounter (Signed)
Pharmacy Patient Advocate Encounter- Botox BIV-Pharmacy Benefit:  PA was submitted to [facility] and has been approved through: 3.22.24 - 3.22.25 Authorization# [id number]   Please send prescription to Specialty Pharmacy: [facility]: [phone]  Estimated Copay is: $0  Patient IS eligible for Botox Copay Card, which will make patient's copay as little as zero. Copay card will be provided to pharmacy.

## 2022-09-12 ENCOUNTER — Other Ambulatory Visit (HOSPITAL_COMMUNITY): Payer: Self-pay

## 2022-09-13 ENCOUNTER — Encounter: Payer: Self-pay | Admitting: Physician Assistant

## 2022-09-13 ENCOUNTER — Ambulatory Visit (INDEPENDENT_AMBULATORY_CARE_PROVIDER_SITE_OTHER): Payer: BC Managed Care – PPO | Admitting: Physician Assistant

## 2022-09-13 DIAGNOSIS — F331 Major depressive disorder, recurrent, moderate: Secondary | ICD-10-CM

## 2022-09-13 DIAGNOSIS — F99 Mental disorder, not otherwise specified: Secondary | ICD-10-CM | POA: Diagnosis not present

## 2022-09-13 DIAGNOSIS — F411 Generalized anxiety disorder: Secondary | ICD-10-CM

## 2022-09-13 DIAGNOSIS — F5105 Insomnia due to other mental disorder: Secondary | ICD-10-CM | POA: Diagnosis not present

## 2022-09-13 MED ORDER — ALPRAZOLAM 1 MG PO TABS: 2.0000 mg | ORAL_TABLET | Freq: Every day | ORAL | 2 refills | Status: DC

## 2022-09-13 MED ORDER — DULOXETINE HCL 30 MG PO CPEP: 30.0000 mg | ORAL_CAPSULE | Freq: Every day | ORAL | 0 refills | Status: DC

## 2022-09-13 MED ORDER — DULOXETINE HCL 60 MG PO CPEP: 60.0000 mg | ORAL_CAPSULE | Freq: Every day | ORAL | 1 refills | Status: DC

## 2022-09-13 MED ORDER — ZALEPLON 10 MG PO CAPS: ORAL_CAPSULE | ORAL | 2 refills | Status: DC

## 2022-09-13 NOTE — Progress Notes (Signed)
[facility]  Patient ID: Yolanda Wolfe,  MRN: [id number]  PCP: Martinique, Betty G, MD  Date of Evaluation: 09/13/2022  Time spent:20 minutes  Chief Complaint:  Chief Complaint   Anxiety; Depression; Insomnia; Follow-up    HISTORY/CURRENT STATUS: HPI  For routine med check.  Has anhedonia.  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  Since going off mirtazapine her appetite has decreased, she has not gained any more weight and the weight is decreasing extremely slowly.  Personal hygiene is normal.  Has not really been wanting to do things like she used to, including going to the barn and taking care of of her horse.  Feels anxious a lot, no panic attacks but feels on edge, like something bad may happen at any time.  We changed Ativan to Xanax since the last visit and that has been more helpful.  She is not having as much trouble falling asleep but still has trouble staying asleep.  She may sleep for around 5 hours and then wakes up, often not able to go back to sleep.  She forgets about the [employer] so has not tried to take it in the middle of the night. No SI/HI.  Patient denies increased energy with decreased need for sleep, increased talkativeness, racing thoughts, impulsivity or risky behaviors, increased spending, increased libido, grandiosity, increased irritability or anger, paranoia, or hallucinations.  Denies dizziness, syncope, seizures, numbness, tingling, tremor, tics, unsteady gait, slurred speech, confusion. Denies muscle or joint pain, stiffness, or dystonia.   Individual Medical History/ Review of Systems: Changes? :No      Past medications for mental health diagnoses include: Amitriptylline for sleep? Celexa, Lunesta, Ativan, Zoloft, Belsomra, Temazapam Topamax for migraines, Wellbutrin caused migraines to worsen, trazodone not effective at low doses but at 100 mg she was  too groggy, Mirtazapine caused extreme wt gain  Allergies: Wellbutrin [bupropion]  Current Medications:  Current Outpatient Medications:    acetaZOLAMIDE (DIAMOX) 250 MG tablet, TAKE 1 TABLET BY MOUTH TWICE A DAY, Disp: 180 tablet, Rfl: 1   botulinum toxin Type A (BOTOX) 200 units injection, Inject 155 units IM into multiple site in the face,neck and head once every 90 days, Disp: 1 each, Rfl: 4   cyanocobalamin (VITAMIN B12) 500 MCG tablet, Take 500 mcg by mouth daily., Disp: , Rfl:    DULoxetine (CYMBALTA) 30 MG capsule, Take 1 capsule (30 mg total) by mouth daily., Disp: 14 capsule, Rfl: 0   DULoxetine (CYMBALTA) 60 MG capsule, Take 1 capsule (60 mg total) by mouth daily. Beginning after 2 weeks of 30 mg daily., Disp: 30 capsule, Rfl: 1   Lurasidone HCl 60 MG TABS, TAKE 1 TABLET BY MOUTH EVERY DAY WITH SUPPER, Disp: 30 tablet, Rfl: 1   tretinoin (RETIN-A) 0.1 % cream, , Disp: , Rfl:    Vitamin D, Ergocalciferol, (DRISDOL) 1.25 MG (50000 UNIT) CAPS capsule, Take 1 capsule (50,000 Units total) by mouth every 7 (seven) days for 8 doses., Disp: 8 capsule, Rfl: 0   ALPRAZolam (XANAX) 1 MG tablet, Take 2 tablets (2 mg total) by mouth at bedtime., Disp: 60 tablet, Rfl: 2   Rimegepant Sulfate (NURTEC) 75 MG TBDP, Take 1 tablet by mouth daily as needed. (Patient not taking: Reported on 09/13/2022), Disp: 16 tablet, Rfl: 5   zaleplon (SONATA) 10 MG capsule, 1 po qhs prn sleep. May repeat 1 prn for midnocturnal awakening as long  as there is 3-4 hours left to sleep., Disp: 30 capsule, Rfl: 2 Medication Side Effects: headache possibly from Wellbutrin, now or not sure if it was coincidental. Extreme wt gain from Mirtazapine.   Family Medical/ Social History: Changes? no  MENTAL HEALTH EXAM:  There were no vitals taken for this visit.There is no height or weight on file to calculate BMI.  General Appearance: Casual, Well Groomed, and Obese  Eye Contact:  Good  Speech:  Clear and Coherent and Normal Rate   Volume:  Normal  Mood:  Depressed  Affect:  Depressed  Thought Process:  Goal Directed and Descriptions of Associations: Circumstantial  Orientation:  Full (Time, Place, and Person)  Thought Content: Logical   Suicidal Thoughts:  No  Homicidal Thoughts:  No  Memory:  WNL  Judgement:  Good  Insight:  Good  Psychomotor Activity:  Normal  Concentration:  Concentration: Good and Attention Span: Good  Recall:  Good  Fund of Knowledge: Good  Language: Good  Assets:  Desire for Improvement Financial Resources/Insurance Housing Transportation  ADL's:  Intact  Cognition: WNL  Prognosis:  Good   Dr. Martinique is following labs.   GeneSight test results are in chart had moderately reduced folic acid conversion  DIAGNOSES:    ICD-10-CM   1. Major depressive disorder, recurrent episode, moderate (HCC)  F33.1     2. Insomnia due to other mental disorder  F51.05    F99     3. Generalized anxiety disorder  F41.1       Receiving Psychotherapy: No   RECOMMENDATIONS:  PDMP was reviewed.  Ativan filled 08/19/2022.  Sonata filled 08/08/2022. I provided 20 minutes of face to face time during this encounter, including time spent before and after the visit in records review, medical decision making, counseling pertinent to today's visit, and charting.   We discussed the depression and anxiety.  She has been on Prozac for a long time and I think it is no longer effective.  I recommend changing to an SNRI.  Specifically Cymbalta, it can be helpful for chronic pain, hopefully migraines.  Benefits, risk and side effects were discussed and she accepts.  Wean off Prozac 40 mg by taking 1 daily for 1 week and then stop. Continue Xanax 1 mg, 2 p.o. nightly as needed sleep. Start Cymbalta 30 mg, 1 p.o. daily for 14 days and then changed to 60 mg daily. Continue Latuda 60 mg, 1 p.o. q. evening with food. Continue Sonata 10 mg, 1 p.o. for mid nocturnal awakening as long as she has 3 to 4 hours left to  sleep.  If she needs it before bed she can take it as long as she is not doing Xanax at the same time. Return in 6 weeks. Continue Latuda 60 mg p.o. daily with food. Return in 6 weeks.  Donnal Moat, PA-C

## 2022-09-13 NOTE — Patient Instructions (Signed)
On Prozac 40 mg, take 1 daily for 1 week, then stop. Then start the Cymbalta 30 mg daily for 2 weeks, then increase to the 60 mg daily.

## 2022-10-04 ENCOUNTER — Other Ambulatory Visit: Payer: Self-pay | Admitting: Neurology

## 2022-10-04 NOTE — Progress Notes (Unsigned)
NEUROLOGY FOLLOW UP OFFICE NOTE  Yolanda Wolfe [id number]  Assessment/Plan:   Chronic migraine without aura without status migrainosus, not intractable  Possible idiopathic intracranial hypertension   Migraine prevention:  Botox.  She has demonstrated improvement in reduced severity and frequency. Migraine rescue:  Nurtec.  Zofran for nausea Decrease acetazolamide to  twice daily.  She has follow up eye exam in the summer. Will discontinue if stable. Limit use of pain relievers to no more than 2 days out of week to prevent risk of rebound or medication-overuse headache. Keep headache diary Follow up 6 months.  Subjective:  Yolanda Wolfe is a 47 year old right-handed female with depression and anxiety who follows up for migraines.   UPDATE: Status post 2 rounds of Botox.   Intensity:  mostly mild to moderate Duration:  1-2 hours with Nurtec when effective (Nurtec effective 50% of the time) Frequency: February 5 days, March 9 days, So far this month - 6 days  Re[facility] exam [facility] on 07[facility]18/2022 revealed no optic disc or optic nerve swelling and stable OCT RNFL.    Frequency of abortive medication: 7 days this month. Current NSAIDS/analgesics:  Excedrin Current triptans:  none Current ergotamine:  none Current anti-emetic:  Zofran ODT  Current muscle relaxants:  none Current Antihypertensive medications:  none Current Antidepressant/antipsychotic medications:  Duloxetine  daily Current Anticonvulsant medications:  none Current anti-CGRP:  Emaglity, Ubrelvy  Current Vitamins/Herbal/Supplements:  Deplin, melatonin Current Antihistamines/Decongestants:  none Other therapy:  none Hormone/birth control:  none Other medications:  Acetazolamide  BID, Lorazepam  QHS   Caffeine:  Sweet tea.  No coffee Diet:  80 oz water daily.  Opto diet.  Does not skip meals.  Optavia Diet. Exercise:  no Depression:  yes; Anxiety:  yes Other pain:   no Sleep:  Poor.  Lorazepam helps her stay asleep.  Wakes up tired.  Sleep apnea ruled out.    HISTORY:  Headaches since childhood.  They are severe diffuse but primarily bi-temporal pain with associated nausea, photophobia, phonophobia but no associated vomiting, visual disturbance, autonomic symptoms, numbness or weakness.  They have progressively gotten worse over the past year from every 2 days-every 2 weeks to daily.  She reports increased stress and anxiety.  They last hours to 2 days.  Previously would occur every 2 days to every 2 weeks.  No explainable trigger.  No known relieving factors.  She has been taking Excedrin almost daily.     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   Reports tinnitus in right ear.  Audiometric testing reportedly normal.  She started experiencing right sided pulsatile tinnitus around December 2022.  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  Denies visual obscurations or blurred visReferred to ophthalmology.  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  LP on 01/19/2022 demonstrated a borderline elevated opening pressure of 23 cm water.  MRV of head on 02/04/2022 showed wasting at the bilateral transverse sigmoid junctions.  She was started on acetazolamide  twice daily.      Past NSAIDS/analgesics:  Ibuprofen, Tylenol Past abortive triptans:  rizatriptan, sumatriptan tab Past abortive ergotamine:  none  Past muscle relaxants:  none Past anti-emetic:  none Past antihypertensive medications:  none Past antidepressant medications:  Amitriptyline, sertraline,  fluoxetine, Latuda Past anticonvulsant medications:  topiramate Past anti-CGRP:  Aimovig  Past vitamins/Herbal/Supplements:  none Past antihistamines/decongestants:  none Other past therapies:  none     Family history of headache:  No  PAST MEDICAL HISTORY: Past Medical History:  Diagnosis Date   Anxiety    Blood transfusion without reported diagnosis    Depression    Frequent headaches    GERD (gastroesophageal reflux disease)    Heart murmur    Migraine    UTI (lower urinary tract infection)     MEDICATIONS: Current Outpatient Medications on File Prior to Visit  Medication Sig Dispense Refill   acetaZOLAMIDE (DIAMOX) 250 MG tablet TAKE 1 TABLET BY MOUTH TWICE A DAY 180 tablet 1   ALPRAZolam (XANAX) 1 MG tablet Take 2 tablets (2 mg total) by mouth at bedtime. 60 tablet 2   botulinum toxin Type A (BOTOX) 200 units injection Inject 155 units IM into multiple site in the face,neck and head once every 90 days 1 each 4   cyanocobalamin (VITAMIN B12) 500 MCG tablet Take 500 mcg by mouth daily.     DULoxetine (CYMBALTA) 30 MG capsule Take 1 capsule (30 mg total) by mouth daily. 14 capsule 0   DULoxetine (CYMBALTA) 60 MG capsule Take 1 capsule (60 mg total) by mouth daily. Beginning after 2 weeks of 30 mg daily. 30 capsule 1   Lurasidone HCl 60 MG TABS TAKE 1 TABLET BY MOUTH EVERY DAY WITH SUPPER 30 tablet 1   Rimegepant Sulfate (NURTEC) 75 MG TBDP Take 1 tablet by mouth daily as needed. (Patient not taking: Reported on 09/13/2022) 16 tablet 5   tretinoin (RETIN-A) 0.1 % cream      zaleplon (SONATA) 10 MG capsule 1 po qhs prn sleep. May repeat 1 prn for midnocturnal awakening as long as there is 3-4 hours left to sleep. 30 capsule 2   No current facility-administered medications on file prior to visit.    ALLERGIES: Allergies  Allergen Reactions   Wellbutrin [Bupropion] Other (See Comments)    Worsened migraines    FAMILY HISTORY: Family History  Problem Relation Age of  Onset   Cancer Maternal Grandmother        breast   Heart disease Maternal Grandfather    Alcohol abuse Paternal Grandfather    Dementia Paternal Grandmother    Cancer Mother        neuroendocrine cancer   Cancer Father        skin   Healthy Daughter       Objective:  Blood pressure (!) 99/57, pulse 67, resp. rate 18, height  (1.626 m), weight 207 lb (93.9 kg), SpO2 98 %. General: No acute distress.  Patient appears well-groomed.      Shon Millet, DO  CC: Betty Swaziland, MD

## 2022-10-05 ENCOUNTER — Encounter: Payer: Self-pay | Admitting: Neurology

## 2022-10-05 ENCOUNTER — Ambulatory Visit: Payer: BC Managed Care – PPO | Admitting: Neurology

## 2022-10-05 VITALS — BP 99/57 | HR 67 | Resp 18 | Ht 64.0 in | Wt 207.0 lb

## 2022-10-05 DIAGNOSIS — G932 Benign intracranial hypertension: Secondary | ICD-10-CM

## 2022-10-05 DIAGNOSIS — G43009 Migraine without aura, not intractable, without status migrainosus: Secondary | ICD-10-CM | POA: Diagnosis not present

## 2022-10-05 NOTE — Patient Instructions (Addendum)
Decrease acetazolamide  to 1/2 tablet twice daily.  Follow up with Elmer Picker as recommended.  If still okay, will discontinue Continue Nurtec as needed. If Nurtec ineffective, try Trudhesa nasal spray - 1 spray in each nostril.  May repeat after 1 hour (only 2 doses in 24 hours) Follow up for Botox Follow up in 6 months.

## 2022-10-07 ENCOUNTER — Other Ambulatory Visit: Payer: Self-pay | Admitting: Physician Assistant

## 2022-10-15 ENCOUNTER — Other Ambulatory Visit: Payer: Self-pay | Admitting: Physician Assistant

## 2022-10-18 ENCOUNTER — Other Ambulatory Visit: Payer: Self-pay

## 2022-10-21 ENCOUNTER — Other Ambulatory Visit (HOSPITAL_COMMUNITY): Payer: Self-pay

## 2022-10-25 ENCOUNTER — Ambulatory Visit: Payer: BC Managed Care – PPO | Admitting: Physician Assistant

## 2022-10-25 ENCOUNTER — Encounter: Payer: Self-pay | Admitting: Physician Assistant

## 2022-10-25 DIAGNOSIS — F411 Generalized anxiety disorder: Secondary | ICD-10-CM

## 2022-10-25 DIAGNOSIS — F99 Mental disorder, not otherwise specified: Secondary | ICD-10-CM

## 2022-10-25 DIAGNOSIS — F331 Major depressive disorder, recurrent, moderate: Secondary | ICD-10-CM

## 2022-10-25 DIAGNOSIS — F5105 Insomnia due to other mental disorder: Secondary | ICD-10-CM | POA: Diagnosis not present

## 2022-10-25 MED ORDER — LURASIDONE HCL 60 MG PO TABS: ORAL_TABLET | ORAL | 2 refills | Status: DC

## 2022-10-25 MED ORDER — ZALEPLON 10 MG PO CAPS: ORAL_CAPSULE | ORAL | 2 refills | Status: DC

## 2022-10-25 MED ORDER — DULOXETINE HCL 30 MG PO CPEP: 90.0000 mg | ORAL_CAPSULE | Freq: Every day | ORAL | 1 refills | Status: DC

## 2022-10-25 NOTE — Progress Notes (Signed)
[facility]  Patient ID: Yolanda Wolfe,  MRN: [id number]  PCP: Swaz[place], Betty G, MD  Date of Evaluation: 10/25/2022  Time spent:20 minutes  Chief Complaint:  Chief Complaint   Anxiety; Depression    HISTORY/CURRENT STATUS: HPI  For routine med check.  We changed Prozac to Cymbalta 6 weeks ago.  States her mood is about the same.  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  Sleep is still an issue, the pharmacy only gives her 15 pills/month so she is not able to take the Sonata every night.  It does help some when she takes it.  She already practices good sleep hygiene.  Anxiety is well controlled.  Not having panic attacks.    Patient is able to enjoy things.  Energy and motivation are good.  Work is going well.   No extreme sadness, tearfulness, or feelings of hopelessness.  ADLs and personal hygiene are normal.   Denies any changes in concentration, making decisions, or remembering things.  Appetite has not changed.  Weight is stable.   Denies suicidal or homicidal thoughts.  Patient denies increased energy with decreased need for sleep, increased talkativeness, racing thoughts, impulsivity or risky behaviors, increased spending, increased libido, grandiosity, increased irritability or anger, paranoia, or hallucinations.  Denies dizziness, syncope, seizures, numbness, tingling, tremor, tics, unsteady gait, slurred speech, confusion. Denies muscle or joint pain, stiffness, or dystonia.   Individual Medical History/ Review of Systems: Changes? :No    migraines have been some better over the past month.  Possibly related to Cymbalta.  Past medications for mental health diagnoses include: Amitriptylline for sleep? Celexa, Lunesta, Ativan, Zoloft, Belsomra, Temazapam Topamax for migraines, Wellbutrin caused migraines to worsen, trazodone not effective at low doses but at 100 mg she was too groggy, Mirtazapine caused extreme wt  gain  Allergies: Wellbutrin [bupropion]  Current Medications:  Current Outpatient Medications:    acetaZOLAMIDE (DIAMOX) 250 MG tablet, TAKE 1 TABLET BY MOUTH TWICE A DAY, Disp: 180 tablet, Rfl: 1   ALPRAZolam (XANAX) 1 MG tablet, Take 2 tablets (2 mg total) by mouth at bedtime., Disp: 60 tablet, Rfl: 2   botulinum toxin Type A (BOTOX) 200 units injection, Inject 155 units IM into multiple site in the face,neck and head once every 90 days, Disp: 1 each, Rfl: 4   DULoxetine (CYMBALTA) 30 MG capsule, Take 3 capsules (90 mg total) by mouth daily., Disp: 90 capsule, Rfl: 1   Rimegepant Sulfate (NURTEC) 75 MG TBDP, Take 1 tablet by mouth daily as needed., Disp: 16 tablet, Rfl: 5   tretinoin (RETIN-A) 0.1 % cream, , Disp: , Rfl:    cyanocobalamin (VITAMIN B12) 500 MCG tablet, Take 500 mcg by mouth daily., Disp: , Rfl:    Lurasidone HCl 60 MG TABS, TAKE 1 TABLET BY MOUTH EVERY DAY WITH SUPPER, Disp: 30 tablet, Rfl: 2   zaleplon (SONATA) 10 MG capsule, 1 po qhs prn sleep. May repeat 1 prn for midnocturnal awakening as long as there is 3-4 hours left to sleep., Disp: 60 capsule, Rfl: 2 Medication Side Effects: headache   Family Medical/ Social History: Changes? no  MENTAL HEALTH EXAM:  There were no vitals taken for this visit.There is no height or weight on file to calculate BMI.  General Appearance: Casual, Well Groomed, and Obese  Eye Contact:  Good  Speech:  Clear and Coherent and Normal Rate  Volume:  Normal  Mood:  Euthymic  Affect:  Congruent  Thought Process:  Goal Directed and Descriptions of Associations: Circumstantial  Orientation:  Full (Time, Place, and Person)  Thought Content: Logical   Suicidal Thoughts:  No  Homicidal Thoughts:  No  Memory:  WNL  Judgement:  Good  Insight:  Good  Psychomotor Activity:  Normal  Concentration:  Concentration: Good and Attention Span: Good  Recall:  Good  Fund of Knowledge: Good  Language: Good  Assets:  Desire for  Improvement Financial Resources/Insurance Housing Transportation  ADL's:  Intact  Cognition: WNL  Prognosis:  Good   Dr. Swaziland is following labs.   GeneSight test results are in chart had moderately reduced folic acid conversion  DIAGNOSES:    ICD-10-CM   1. Major depressive disorder, recurrent episode, moderate (HCC)  F33.1     2. Insomnia due to other mental disorder  F51.05    F99     3. Generalized anxiety disorder  F41.1       Receiving Psychotherapy: No   RECOMMENDATIONS:  PDMP was reviewed.  Xanax filled 10/16/2022.  Sonata filled 09/14/2022. I provided 20 minutes of face to face time during this encounter, including time spent before and after the visit in records review, medical decision making, counseling pertinent to today's visit, and charting.   Her mood is about the same, I recommend increasing the Cymbalta.  I am glad the migraines are some better.  I think it probably is related to starting Cymbalta. Sleep hygiene discussed.  Continue Xanax 1 mg, 2 p.o. nightly as needed. Increase Cymbalta 30 mg to 3 p.o. daily.   Continue Latuda 60 mg, 1 p.o. q. evening with food. Continue Sonata 10 mg, 1 p.o. for mid nocturnal awakening as long as she has 3 to 4 hours left to sleep.  If she needs it before bed she can take it as long as she is not doing Xanax at the same time.  Recommend she use GoodRx, not insurance and maybe they will give her the quantity that I have ordered. Return in 6 weeks.  Melony Overly, PA-C

## 2022-10-28 ENCOUNTER — Other Ambulatory Visit: Payer: Self-pay

## 2022-10-28 ENCOUNTER — Ambulatory Visit: Payer: BC Managed Care – PPO | Admitting: Neurology

## 2022-10-28 DIAGNOSIS — G43709 Chronic migraine without aura, not intractable, without status migrainosus: Secondary | ICD-10-CM | POA: Diagnosis not present

## 2022-10-28 MED ORDER — NURTEC 75 MG PO TBDP: 1.0000 | ORAL_TABLET | Freq: Every day | ORAL | 5 refills | Status: DC | PRN

## 2022-10-28 MED ORDER — ONABOTULINUMTOXINA 100 UNITS IJ SOLR
200.0000 [IU] | Freq: Once | INTRAMUSCULAR | Status: AC
  Administered 2022-10-28: 155 [IU] via INTRAMUSCULAR

## 2022-10-28 NOTE — Progress Notes (Unsigned)
[facility]  ° °Procedure Note Botox ° °Attending: Dr. Ac Colan ° °Preoperative Diagnosis(es): Chronic migraine ° °Consent obtained from: The patient °Benefits discussed included, but were not limited to decreased muscle tightness, increased joint range of motion, and decreased pain.  Risk discussed included, but were not limited pain and discomfort, bleeding, bruising, excessive weakness, venous thrombosis, muscle atrophy and dysphagia.  Anticipated outcomes of the procedure as well as he risks and benefits of the alternatives to the procedure, and the roles and tasks of the personnel to be involved, were discussed with the patient, and the patient consents to the procedure and agrees to proceed. A copy of the patient medication guide was given to the patient which explains the blackbox warning. ° °Patients identity and treatment sites confirmed Yes.  . ° °Details of Procedure: °Skin was cleaned with alcohol. Prior to injection, the needle plunger was aspirated to make sure the needle was not within a blood vessel.  There was no blood retrieved on aspiration.   ° °Following is a summary of the muscles injected  And the amount of Botulinum toxin used: ° °Dilution °200 units of Botox was reconstituted with 4 ml of preservative free normal saline. °Time of reconstitution: At the time of the office visit (<30 minutes prior to injection)  ° °Injections  °155 total units of Botox was injected with a 30 gauge needle. ° °Injection Sites: °L occipitalis: 15 units- 3 sites  °R occiptalis: 15 units- 3 sites ° °L upper trapezius: 15 units- 3 sites °R upper trapezius: 15 units- 3 sits          °L paraspinal: 10 units- 2 sites °R paraspinal: 10 units- 2 sites ° °Face °L frontalis(2 injection sites):10 units   °R frontalis(2 injection sites):10 units         °L corrugator: 5 units   °R corrugator: 5 units           °Procerus: 5 units   °L temporalis: 20 units °R temporalis: 20 units  ° °Agent:  °200 units of botulinum Type  A (Onobotulinum Toxin type A) was reconstituted with 4 ml of preservative free normal saline.  °Time of reconstitution: At the time of the office visit (<30 minutes prior to injection)  ° ° ° Total injected (Units):  155 ° Total wasted (Units):  45 ° °Patient tolerated procedure well without complications.   °Reinjection is anticipated in 3 months. ° ° °

## 2022-10-31 ENCOUNTER — Encounter (INDEPENDENT_AMBULATORY_CARE_PROVIDER_SITE_OTHER): Payer: Self-pay | Admitting: Family Medicine

## 2022-11-04 ENCOUNTER — Ambulatory Visit: Payer: BC Managed Care – PPO | Admitting: Neurology

## 2022-11-11 ENCOUNTER — Encounter: Payer: Self-pay | Admitting: Family Medicine

## 2022-11-13 ENCOUNTER — Other Ambulatory Visit: Payer: Self-pay | Admitting: Physician Assistant

## 2022-11-15 ENCOUNTER — Encounter: Payer: Self-pay | Admitting: Neurology

## 2022-11-16 ENCOUNTER — Telehealth: Payer: Self-pay

## 2022-11-16 ENCOUNTER — Other Ambulatory Visit (HOSPITAL_COMMUNITY): Payer: Self-pay

## 2022-11-16 ENCOUNTER — Telehealth: Payer: Self-pay | Admitting: Pharmacy Technician

## 2022-11-16 NOTE — Telephone Encounter (Signed)
Patient Advocate Encounter   Received notification that prior authorization for Nurtec 75MG  dispersible tablets is required.   PA submitted on 11/16/2022 [facility] Insurance [employer] Electronic PA Form Status is pending

## 2022-11-16 NOTE — Telephone Encounter (Signed)
Status of PA in separate encounter 

## 2022-11-16 NOTE — Telephone Encounter (Signed)
Per patient  [facility] says [facility] are still waiting for prior authorization on my Nurtec. Can you please check on this? Thank you!   Lynn Ito team please start PA for Nurtec.

## 2022-11-16 NOTE — Telephone Encounter (Signed)
Patient Advocate Encounter  Prior Authorization for NURTEC 75MG  has been approved with [employer].    PA# [id number] Effective dates: 5.29.24 through 12.31.24  Per [facility] test claim, copay for 30 days supply is $0

## 2022-11-18 ENCOUNTER — Other Ambulatory Visit (HOSPITAL_COMMUNITY): Payer: Self-pay

## 2022-11-29 ENCOUNTER — Other Ambulatory Visit: Payer: Self-pay | Admitting: Physician Assistant

## 2022-12-01 ENCOUNTER — Ambulatory Visit (INDEPENDENT_AMBULATORY_CARE_PROVIDER_SITE_OTHER): Payer: BC Managed Care – PPO

## 2022-12-01 DIAGNOSIS — Z23 Encounter for immunization: Secondary | ICD-10-CM

## 2022-12-06 ENCOUNTER — Ambulatory Visit: Payer: BC Managed Care – PPO | Admitting: Physician Assistant

## 2022-12-06 ENCOUNTER — Encounter: Payer: Self-pay | Admitting: Physician Assistant

## 2022-12-06 DIAGNOSIS — F5105 Insomnia due to other mental disorder: Secondary | ICD-10-CM

## 2022-12-06 DIAGNOSIS — F19981 Other psychoactive substance use, unspecified with psychoactive substance-induced sexual dysfunction: Secondary | ICD-10-CM

## 2022-12-06 DIAGNOSIS — F411 Generalized anxiety disorder: Secondary | ICD-10-CM

## 2022-12-06 DIAGNOSIS — F3341 Major depressive disorder, recurrent, in partial remission: Secondary | ICD-10-CM | POA: Diagnosis not present

## 2022-12-06 DIAGNOSIS — F99 Mental disorder, not otherwise specified: Secondary | ICD-10-CM

## 2022-12-06 NOTE — Progress Notes (Signed)
[facility]  Patient ID: Yolanda Wolfe,  MRN: [id number]  PCP: [place], Betty G, MD  Date of Evaluation: 12/06/2022  Time spent:20 minutes  Chief Complaint:  Chief Complaint   Follow-up    HISTORY/CURRENT STATUS: HPI  For routine med check.  Cymbalta dose was increased 6 weeks ago.  States she is feeling quite a bit better.  More able to enjoy things although it is a little bit difficult with the heat.  She likes to ride her horse but it has really been too hot to do that until later in the evening.  She has been reading for pleasure.  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  Energy and motivation are good.  Appetite is normal and weight is stable.  Personal hygiene and ADLs are normal.  Not crying easily.  She has been sleeping much better.  No suicidal or homicidal thoughts.  Her only complaint right now is decreased libido.  This has been more noticeable in the past few weeks.  She has no desire at all.  This is not a problem at this point between her and her husband but she does not want to stay this way.  Patient denies increased energy with decreased need for sleep, increased talkativeness, racing thoughts, impulsivity or risky behaviors, increased spending, increased libido, grandiosity, increased irritability or anger, paranoia, or hallucinations.  Denies dizziness, syncope, seizures, numbness, tingling, tremor, tics, unsteady gait, slurred speech, confusion. Denies muscle or joint pain, stiffness, or dystonia.   Individual Medical History/ Review of Systems: Changes? :No      Past medications for mental health diagnoses include: Amitriptylline for sleep? Celexa, Lunesta, Ativan, Zoloft, Belsomra, Temazapam Topamax for migraines, Wellbutrin caused migraines to worsen, trazodone not effective at low doses but at 100 mg she was too groggy, Mirtazapine caused extreme wt gain  Allergies: Wellbutrin [bupropion]  Current  Medications:  Current Outpatient Medications:    acetaZOLAMIDE (DIAMOX) 250 MG tablet, TAKE 1 TABLET BY MOUTH TWICE A DAY, Disp: 180 tablet, Rfl: 1   ALPRAZolam (XANAX) 1 MG tablet, Take 2 tablets (2 mg total) by mouth at bedtime., Disp: 60 tablet, Rfl: 2   botulinum toxin Type A (BOTOX) 200 units injection, Inject 155 units IM into multiple site in the face,neck and head once every 90 days, Disp: 1 each, Rfl: 4   DULoxetine (CYMBALTA) 30 MG capsule, TAKE 3 CAPSULES BY MOUTH EVERY DAY, Disp: 270 capsule, Rfl: 0   Lurasidone HCl 60 MG TABS, TAKE 1 TABLET BY MOUTH EVERY DAY WITH SUPPER, Disp: 30 tablet, Rfl: 2   Rimegepant Sulfate (NURTEC) 75 MG TBDP, Take 1 tablet (75 mg total) by mouth daily as needed., Disp: 16 tablet, Rfl: 5   tretinoin (RETIN-A) 0.1 % cream, , Disp: , Rfl:    zaleplon (SONATA) 10 MG capsule, 1 po qhs prn sleep. May repeat 1 prn for midnocturnal awakening as long as there is 3-4 hours left to sleep., Disp: 60 capsule, Rfl: 2 Medication Side Effects:  H/A w/ Wellbutrin, Sexual dysfunction with Cymbalta    Family Medical/ Social History: Changes? no  MENTAL HEALTH EXAM:  There were no vitals taken for this visit.There is no height or weight on file to calculate BMI.  General Appearance: Casual, Well Groomed, and Obese  Eye Contact:  Good  Speech:  Clear and Coherent and Normal Rate  Volume:  Normal  Mood:  Euthymic  Affect:  Congruent  Thought Process:  Goal Directed and  Descriptions of Associations: Circumstantial  Orientation:  Full (Time, Place, and Person)  Thought Content: Logical   Suicidal Thoughts:  No  Homicidal Thoughts:  No  Memory:  WNL  Judgement:  Good  Insight:  Good  Psychomotor Activity:  Normal  Concentration:  Concentration: Good and Attention Span: Good  Recall:  Good  Fund of Knowledge: Good  Language: Good  Assets:  Desire for Improvement Financial Resources/Insurance Housing Transportation Vocational/Educational  ADL's:  Intact   Cognition: WNL  Prognosis:  Good   Dr. [place] is following labs.   GeneSight test results are in chart had moderately reduced folic acid conversion  DIAGNOSES:    ICD-10-CM   1. Depression, major, recurrent, in partial remission (HCC)  F33.41     2. Generalized anxiety disorder  F41.1     3. Insomnia due to other mental disorder  F51.05    F99     4. 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  Xanax filled 11/18/2022.  Sonata filled 09/14/2022. I provided 20 minutes of face to face time during this encounter, including time spent before and after the visit in records review, medical decision making, counseling pertinent to today's visit, and charting.   I am glad to see her doing better! We discussed the sexual dysfunction, it definitely could be caused from the Cymbalta.  Since she is doing so much better with her mental health and were not sure that the Cymbalta is the only reason for the lack of desire although it is new, we agreed to make no changes for another 6 weeks and reevaluate.  If it is still a problem within we can readdress.  If she is still doing well we would plan to decrease the dose of Cymbalta.  Wellbutrin is not an option because it caused increased migraine in the past.  Continue Xanax 1 mg, 2 p.o. nightly as needed. Continue Cymbalta 30 mg,  3 p.o. daily.   Continue Latuda 60 mg, 1 p.o. q. evening with food. Continue Sonata 10 mg, 1 p.o. for mid nocturnal awakening as long as she has 3 to 4 hours left to sleep.  If she needs it before bed she can take it as long as she is not doing Xanax at the same time.   Return in 6 weeks.  Melony Overly, PA-C

## 2022-12-19 ENCOUNTER — Other Ambulatory Visit: Payer: Self-pay | Admitting: Physician Assistant

## 2022-12-20 NOTE — Telephone Encounter (Signed)
Pt called at 10:33a stating that there was some confusion about the med she wanted for sleep.  She wants the Alprazolam and she will not pick up the other one [facility] sent in.  Pls send to   [id number] - [place], [place] - [address] [place] [address] Phone: [phone]  Fax: [phone]   Next appt 7/30

## 2023-01-10 ENCOUNTER — Other Ambulatory Visit: Payer: Self-pay

## 2023-01-12 ENCOUNTER — Other Ambulatory Visit (HOSPITAL_COMMUNITY): Payer: Self-pay

## 2023-01-17 ENCOUNTER — Ambulatory Visit: Payer: BC Managed Care – PPO | Admitting: Physician Assistant

## 2023-01-20 ENCOUNTER — Other Ambulatory Visit: Payer: Self-pay | Admitting: Physician Assistant

## 2023-01-20 ENCOUNTER — Other Ambulatory Visit (HOSPITAL_COMMUNITY): Payer: Self-pay

## 2023-01-24 ENCOUNTER — Other Ambulatory Visit: Payer: Self-pay

## 2023-02-03 ENCOUNTER — Ambulatory Visit: Payer: BC Managed Care – PPO | Admitting: Neurology

## 2023-02-03 DIAGNOSIS — G43709 Chronic migraine without aura, not intractable, without status migrainosus: Secondary | ICD-10-CM | POA: Diagnosis not present

## 2023-02-03 MED ORDER — ONABOTULINUMTOXINA 100 UNITS IJ SOLR
200.0000 [IU] | Freq: Once | INTRAMUSCULAR | Status: AC
Start: 2023-02-03 — End: 2023-02-03
  Administered 2023-02-03: 155 [IU] via INTRAMUSCULAR

## 2023-02-03 NOTE — Progress Notes (Signed)

## 2023-02-05 ENCOUNTER — Other Ambulatory Visit: Payer: Self-pay | Admitting: Physician Assistant

## 2023-02-13 ENCOUNTER — Encounter: Payer: Self-pay | Admitting: Physician Assistant

## 2023-02-13 ENCOUNTER — Telehealth (INDEPENDENT_AMBULATORY_CARE_PROVIDER_SITE_OTHER): Payer: BC Managed Care – PPO | Admitting: Physician Assistant

## 2023-02-13 DIAGNOSIS — F518 Other sleep disorders not due to a substance or known physiological condition: Secondary | ICD-10-CM

## 2023-02-13 DIAGNOSIS — F411 Generalized anxiety disorder: Secondary | ICD-10-CM

## 2023-02-13 DIAGNOSIS — F3341 Major depressive disorder, recurrent, in partial remission: Secondary | ICD-10-CM | POA: Diagnosis not present

## 2023-02-13 DIAGNOSIS — F5105 Insomnia due to other mental disorder: Secondary | ICD-10-CM | POA: Diagnosis not present

## 2023-02-13 DIAGNOSIS — F99 Mental disorder, not otherwise specified: Secondary | ICD-10-CM

## 2023-02-13 MED ORDER — ALPRAZOLAM 1 MG PO TABS: 2.0000 mg | ORAL_TABLET | Freq: Every day | ORAL | 5 refills | Status: DC

## 2023-02-13 MED ORDER — PRAZOSIN HCL 1 MG PO CAPS: 1.0000 mg | ORAL_CAPSULE | Freq: Every day | ORAL | 1 refills | Status: DC

## 2023-02-13 MED ORDER — ZALEPLON 10 MG PO CAPS: ORAL_CAPSULE | ORAL | 5 refills | Status: DC

## 2023-02-13 NOTE — Progress Notes (Unsigned)
[facility]  Patient ID: Yolanda Wolfe,  MRN: [id number]  PCP: [place], Betty G, MD  Date of Evaluation: 02/13/2023  Time spent:25 minutes  Chief Complaint:  Chief Complaint   Anxiety; Depression; Follow-up   Virtual Visit via Telehealth  I connected with patient by a video enabled telemedicine application with their informed consent, and verified patient privacy and that I am speaking with the correct person using two identifiers.  I am private, in my office and the patient is at work.  I discussed the limitations, risks, security and privacy concerns of performing an evaluation and management service by video and the availability of in person appointments. I also discussed with the patient that there may be a patient responsible charge related to this service. The patient expressed understanding and agreed to proceed.   I discussed the assessment and treatment plan with the patient. The patient was provided an opportunity to ask questions and all were answered. The patient agreed with the plan and demonstrated an understanding of the instructions.   The patient was advised to call back or seek an in-person evaluation if the symptoms worsen or if the condition fails to improve as anticipated.  I provided 25 minutes of non-face-to-face time during this encounter.  HISTORY/CURRENT STATUS: HPI  For routine med check.  Having upsetting dreams every night.  It is hard to know when she is waking up if she is still dreaming or not for a minute or so.  It is very disturbing.  She sleeps okay as long as she takes her meds.  Without the Xanax in the evening and Sonata at night, she does not rest well however.  She spoke with her neurologist about it and was told it may be coming from the Cymbalta.  Overall her mood is very stable.  She is back at work now, this is her third week, she is a professor at [employer].  Enjoyed the summer off. Patient is able to enjoy things.  Energy  and motivation are good.   No extreme sadness, tearfulness, or feelings of hopelessness.  ADLs and personal hygiene are normal.   Denies any changes in concentration, making decisions, or remembering things.  Appetite has not changed.  Weight is stable.  Anxiety is well controlled.  Xanax helps when needed.  She is not having panic attacks but more so gets overwhelmed at times.  Denies suicidal or homicidal thoughts.  Patient denies increased energy with decreased need for sleep, increased talkativeness, racing thoughts, impulsivity or risky behaviors, increased spending, increased libido, grandiosity, increased irritability or anger, paranoia, or hallucinations.  Denies dizziness, syncope, seizures, numbness, tingling, tremor, tics, unsteady gait, slurred speech, confusion. Denies muscle or joint pain, stiffness, or dystonia. Denies unexplained weight loss, frequent infections, or sores that heal slowly.  No polyphagia, polydipsia, or polyuria. Denies visual changes or paresthesias.   Individual Medical History/ Review of Systems: Changes? :No      Past medications for mental health diagnoses include: Amitriptylline for sleep? Celexa, Lunesta, Ativan, Zoloft, Belsomra, Temazapam Topamax for migraines, Wellbutrin caused migraines to worsen, trazodone not effective at low doses but at 100 mg she was too groggy, Mirtazapine caused extreme wt gain  Allergies: Wellbutrin [bupropion]  Current Medications:  Current Outpatient Medications:    acetaZOLAMIDE (DIAMOX) 250 MG tablet, TAKE 1 TABLET BY MOUTH TWICE A DAY, Disp: 180 tablet, Rfl: 1   botulinum toxin Type A (BOTOX) 200 units injection, Inject 155 units IM into multiple site in the  face,neck and head once every 90 days, Disp: 1 each, Rfl: 4   DULoxetine (CYMBALTA) 30 MG capsule, TAKE 3 CAPSULES BY MOUTH EVERY DAY, Disp: 270 capsule, Rfl: 0   Lurasidone HCl 60 MG TABS, TAKE 1 TABLET BY MOUTH EVERY DAY WITH SUPPER, Disp: 30 tablet, Rfl: 0   prazosin  (MINIPRESS) 1 MG capsule, Take 1 capsule (1 mg total) by mouth at bedtime., Disp: 30 capsule, Rfl: 1   Rimegepant Sulfate (NURTEC) 75 MG TBDP, Take 1 tablet (75 mg total) by mouth daily as needed., Disp: 16 tablet, Rfl: 5   tretinoin (RETIN-A) 0.1 % cream, , Disp: , Rfl:    ALPRAZolam (XANAX) 1 MG tablet, Take 2 tablets (2 mg total) by mouth at bedtime., Disp: 60 tablet, Rfl: 5   zaleplon (SONATA) 10 MG capsule, 1 po qhs prn sleep. May repeat 1 prn for midnocturnal awakening as long as there is 3-4 hours left to sleep., Disp: 60 capsule, Rfl: 5 Medication Side Effects:  H/A w/ Wellbutrin, Sexual dysfunction with Cymbalta    Family Medical/ Social History: Changes? no  MENTAL HEALTH EXAM:  There were no vitals taken for this visit.There is no height or weight on file to calculate BMI.  General Appearance: Casual, Well Groomed, and Obese  Eye Contact:  Good  Speech:  Clear and Coherent and Normal Rate  Volume:  Normal  Mood:  Euthymic  Affect:  Congruent  Thought Process:  Goal Directed and Descriptions of Associations: Circumstantial  Orientation:  Full (Time, Place, and Person)  Thought Content: Logical   Suicidal Thoughts:  No  Homicidal Thoughts:  No  Memory:  WNL  Judgement:  Good  Insight:  Good  Psychomotor Activity:  Normal  Concentration:  Concentration: Good and Attention Span: Good  Recall:  Good  Fund of Knowledge: Good  Language: Good  Assets:  Communication Skills Desire for Improvement Financial Resources/Insurance Housing Transportation Vocational/Educational  ADL's:  Intact  Cognition: WNL  Prognosis:  Good   Dr. [place] is following labs.   GeneSight test results are in chart had moderately reduced folic acid conversion  DIAGNOSES:    ICD-10-CM   1. Abnormal dreams  F51.8     2. Depression, major, recurrent, in partial remission (HCC)  F33.41     3. Generalized anxiety disorder  F41.1     4. Insomnia due to other mental disorder  F51.05    F99        Receiving Psychotherapy: No   RECOMMENDATIONS:  PDMP was reviewed.  Xanax filled 01/23/2023.  Sonata filled 09/14/2022. I provided 25 minutes of non-face-to-face time during this encounter, including time spent before and after the visit in records review, medical decision making, counseling pertinent to today's visit, and charting.   She is doing well except for the abnormal dreams.  I agree that it is most likely due to the Cymbalta.  Her mood is good so we prefer not to make any changes as far as that goes.  Many of the antidepressants can cause abnormal dreams so if we changed she may still have an issue with dreams.  We discussed other treatment options including prazosin or doxazosin.  Benefits, risk and side effects were discussed and she would like to try 1 or the other.  I recommend praises son.  She will check her blood pressures a few days a week at random times and let me know if it drops below normal.  She states her BP usually runs around 120/70, sometimes  130/80s.  Orthostatic hypotension precautions were discussed.  Continue Xanax 1 mg, 2 p.o. nightly as needed. Continue Cymbalta 30 mg,  3 p.o. daily.   Continue Latuda 60 mg, 1 p.o. q. evening with food. Start prazosin 1 mg, 1 p.o. nightly.  If in 1 week the dreams have not improved and her blood pressure is stable, not below 100/60, she can increase to 2 pills nightly.  She understands. Continue Sonata 10 mg, 1 p.o. for mid nocturnal awakening as long as she has 3 to 4 hours left to sleep.  If she needs it before bed she can take it as long as she is not doing Xanax at the same time.   Return in 4 weeks.  Melony Overly, PA-C

## 2023-02-23 ENCOUNTER — Telehealth: Payer: Self-pay | Admitting: Physician Assistant

## 2023-02-23 NOTE — Telephone Encounter (Signed)
Yolanda Wolfe lvm stating the Prazosin worked well. She is requesting a Rx for 2 mg instead of 1 mg. Last seen 02/13/23 with no follow up scheduled.

## 2023-02-23 NOTE — Telephone Encounter (Signed)
From 8/26 note:  Start prazosin 1 mg, 1 p.o. nightly. If in 1 week the dreams have not improved and her blood pressure is stable, not below 100/60, she can increase to 2 pills nightly. She understands.

## 2023-02-24 MED ORDER — PRAZOSIN HCL 2 MG PO CAPS: 2.0000 mg | ORAL_CAPSULE | Freq: Every day | ORAL | 1 refills | Status: DC

## 2023-02-24 NOTE — Telephone Encounter (Signed)
Patient is asking to increase Prazosin to 2 mg. She is reporting some benefit from 1 mg, but would like to go ahead and increase. She reported BP 2 days ago was 108/68.

## 2023-02-24 NOTE — Telephone Encounter (Signed)
Sent!

## 2023-02-24 NOTE — Telephone Encounter (Signed)
It's ok to increase to 2 pills (2 mg) Please send in, or pend, a new Rx for Prazosin 2 mg #30, 1 at bedtime, RF 1.  that she'll take after finishing the current supply.

## 2023-03-03 ENCOUNTER — Other Ambulatory Visit: Payer: Self-pay | Admitting: Physician Assistant

## 2023-03-08 ENCOUNTER — Other Ambulatory Visit: Payer: Self-pay | Admitting: Physician Assistant

## 2023-03-23 ENCOUNTER — Other Ambulatory Visit: Payer: Self-pay

## 2023-03-25 ENCOUNTER — Other Ambulatory Visit: Payer: Self-pay | Admitting: Physician Assistant

## 2023-03-26 NOTE — Telephone Encounter (Signed)
Has appt. tomorrow

## 2023-03-27 ENCOUNTER — Encounter: Payer: Self-pay | Admitting: Physician Assistant

## 2023-03-27 ENCOUNTER — Ambulatory Visit: Payer: BC Managed Care – PPO | Admitting: Physician Assistant

## 2023-03-27 VITALS — BP 115/79 | HR 78

## 2023-03-27 DIAGNOSIS — F5105 Insomnia due to other mental disorder: Secondary | ICD-10-CM

## 2023-03-27 DIAGNOSIS — F3341 Major depressive disorder, recurrent, in partial remission: Secondary | ICD-10-CM | POA: Diagnosis not present

## 2023-03-27 DIAGNOSIS — F518 Other sleep disorders not due to a substance or known physiological condition: Secondary | ICD-10-CM

## 2023-03-27 DIAGNOSIS — F411 Generalized anxiety disorder: Secondary | ICD-10-CM

## 2023-03-27 DIAGNOSIS — F99 Mental disorder, not otherwise specified: Secondary | ICD-10-CM

## 2023-03-27 MED ORDER — PRAZOSIN HCL 2 MG PO CAPS: 2.0000 mg | ORAL_CAPSULE | Freq: Every day | ORAL | 1 refills | Status: DC

## 2023-03-27 MED ORDER — LURASIDONE HCL 60 MG PO TABS: ORAL_TABLET | ORAL | 1 refills | Status: DC

## 2023-03-27 NOTE — Progress Notes (Signed)
[facility]  Patient ID: Yolanda Wolfe,  MRN: [id number]  PCP: [place], Betty G, MD  Date of Evaluation: 03/27/2023  Time spent: 22 minutes  Chief Complaint:  Chief Complaint   Anxiety; Depression; Insomnia; Follow-up    HISTORY/CURRENT STATUS: HPI  For routine med check.  Prazosin has helped the night terrors. We increased the dose since the LOV.  Was dizzy for a few days but that went away. She's checked her BP and it's been fine. Still has weird dreams some nights but they're tolerable. Not scary like they had been.   Her mood is good.  She does have days occasionally where she feels down, but it only lasts a few days.  It does not affect her work or home life.  Patient is able to enjoy things, especially riding her horse.  Energy and motivation are good.  Work is going well.   No extreme sadness, tearfulness, or feelings of hopelessness. ADLs and personal hygiene are normal.   Denies any changes in concentration, making decisions, or remembering things.  Appetite has not changed.  Weight is stable.  Gets overwhelmed at times but no panic attacks.  Xanax is still effective.  Denies suicidal or homicidal thoughts.  Patient denies increased energy with decreased need for sleep, increased talkativeness, racing thoughts, impulsivity or risky behaviors, increased spending, increased libido, grandiosity, increased irritability or anger, paranoia, or hallucinations.  Denies dizziness, syncope, seizures, numbness, tingling, tremor, tics, unsteady gait, slurred speech, confusion. Denies muscle or joint pain, stiffness, or dystonia. Denies unexplained weight loss, frequent infections, or sores that heal slowly.  No polyphagia, polydipsia, or polyuria. Denies visual changes or paresthesias.   Individual Medical History/ Review of Systems: Changes? :No      Past medications for mental health diagnoses include: Amitriptylline for sleep? Celexa, Lunesta, Ativan, Zoloft,  Belsomra, Temazapam Topamax for migraines, Wellbutrin caused migraines to worsen, trazodone not effective at low doses but at 100 mg she was too groggy, Mirtazapine caused extreme wt gain  Allergies: Wellbutrin [bupropion]  Current Medications:  Current Outpatient Medications:    acetaZOLAMIDE (DIAMOX) 250 MG tablet, TAKE 1 TABLET BY MOUTH TWICE A DAY, Disp: 180 tablet, Rfl: 1   ALPRAZolam (XANAX) 1 MG tablet, Take 2 tablets (2 mg total) by mouth at bedtime., Disp: 60 tablet, Rfl: 5   botulinum toxin Type A (BOTOX) 200 units injection, Inject 155 units IM into multiple site in the face,neck and head once every 90 days, Disp: 1 each, Rfl: 4   DULoxetine (CYMBALTA) 30 MG capsule, TAKE 3 CAPSULES BY MOUTH EVERY DAY, Disp: 270 capsule, Rfl: 0   Rimegepant Sulfate (NURTEC) 75 MG TBDP, Take 1 tablet (75 mg total) by mouth daily as needed., Disp: 16 tablet, Rfl: 5   tretinoin (RETIN-A) 0.1 % cream, , Disp: , Rfl:    zaleplon (SONATA) 10 MG capsule, 1 po qhs prn sleep. May repeat 1 prn for midnocturnal awakening as long as there is 3-4 hours left to sleep., Disp: 60 capsule, Rfl: 5   Lurasidone HCl 60 MG TABS, TAKE 1 TABLET BY MOUTH EVERY DAY WITH SUPPER, Disp: 90 tablet, Rfl: 1   prazosin (MINIPRESS) 2 MG capsule, Take 1 capsule (2 mg total) by mouth at bedtime., Disp: 90 capsule, Rfl: 1 Medication Side Effects:  H/A w/ Wellbutrin, Sexual dysfunction with Cymbalta    Family Medical/ Social History: Changes? no  MENTAL HEALTH EXAM:  Blood pressure 115/79, pulse 78.There is no height or weight on file to calculate  BMI.  General Appearance: Casual, Well Groomed, and Obese  Eye Contact:  Good  Speech:  Clear and Coherent and Normal Rate  Volume:  Normal  Mood:  Euthymic  Affect:  Congruent  Thought Process:  Goal Directed and Descriptions of Associations: Circumstantial  Orientation:  Full (Time, Place, and Person)  Thought Content: Logical   Suicidal Thoughts:  No  Homicidal Thoughts:  No   Memory:  WNL  Judgement:  Good  Insight:  Good  Psychomotor Activity:  Normal  Concentration:  Concentration: Good and Attention Span: Good  Recall:  Good  Fund of Knowledge: Good  Language: Good  Assets:  Communication Skills Desire for Improvement Financial Resources/Insurance Housing Resilience Transportation Vocational/Educational  ADL's:  Intact  Cognition: WNL  Prognosis:  Good   Dr. [place] is following labs.   GeneSight test results are in chart had moderately reduced folic acid conversion  DIAGNOSES:    ICD-10-CM   1. Depression, major, recurrent, in partial remission (HCC)  F33.41     2. Abnormal dreams  F51.8     3. Generalized anxiety disorder  F41.1     4. Insomnia due to other mental disorder  F51.05    F99       Receiving Psychotherapy: No   RECOMMENDATIONS:  PDMP was reviewed.  Xanax filled 03/23/2023.  Sonata filled 02/13/2023. I provided 22 minutes of face to face time during this encounter, including time spent before and after the visit in records review, medical decision making, counseling pertinent to today's visit, and charting.   I am glad to see her doing better as far as nightmares go.  No changes will be made.  Continue Xanax 1 mg, 2 p.o. nightly as needed. Continue Cymbalta 30 mg,  3 p.o. daily.   Continue Latuda 60 mg, 1 p.o. q. evening with food. Continue prazosin 2 mg, 1 p.o. nightly.   Continue Sonata 10 mg, 1 p.o. for mid nocturnal awakening as long as she has 3 to 4 hours left to sleep.  If she needs it before bed she can take it as long as she is not doing Xanax at the same time.   Return in 2 to 3 months.  Melony Overly, PA-C

## 2023-04-06 ENCOUNTER — Other Ambulatory Visit: Payer: Self-pay | Admitting: Neurology

## 2023-04-06 NOTE — Progress Notes (Unsigned)
NEUROLOGY FOLLOW UP OFFICE NOTE  Yolanda Wolfe [id number]  Assessment/Plan:   Chronic migraine without aura without status migrainosus, not intractable  Possible idiopathic intracranial hypertension   Migraine prevention:  Botox.  To help further reduce migraine frequency, start Qulipta 60mg  daily (she would rather avoid injections Migraine rescue:  Nurtec.  Zofran for nausea  Decrease acetazolamide to 125mg  twice daily.  She has follow up eye exam in the summer. Will discontinue if stable. Limit use of pain relievers to no more than 2 days out of week to prevent risk of rebound or medication-overuse headache. Keep headache diary Follow up 6 months.  Subjective:  Yolanda Wolfe is a 47 year old right-handed female with depression and anxiety who follows up for migraines.   UPDATE: Doing well on Botox.   Intensity:  mostly mild to moderate Duration:  1-2 hours with Nurtec when effective (Nurtec effective 50% of the time) Frequency: June: 6; July: 7, August: 8, Sept: 8, Oct: 4 so far.  In April, decreased acetazolamide to 125mg  twice daily.  Scheduled with eye doctor next month.    Frequency of abortive medication: 7 days this month. Current NSAIDS/analgesics:  Excedrin Current triptans:  none Current ergotamine:  none Current anti-emetic:  Zofran ODT 4mg  Current muscle relaxants:  none Current Antihypertensive medications:  none Current Antidepressant/antipsychotic medications:  Duloxetine 30mg  daily Current Anticonvulsant medications:  none Current anti-CGRP:  Ubrelvy 100mg  Current Vitamins/Herbal/Supplements:  Deplin, melatonin Current Antihistamines/Decongestants:  none Other therapy:  none Hormone/birth control:  none Other medications:  Acetazolamide 125mg  BID, Lorazepam 2mg  QHS   Caffeine:  Sweet tea.  No coffee Diet:  80 oz water daily.  Opto diet.  Does not skip meals.  Optavia Diet. Exercise:  no Depression:  yes; Anxiety:  yes Other pain:   no Sleep:  Poor.  Lorazepam helps her stay asleep.  Wakes up tired.  Sleep apnea ruled out.    HISTORY:  Headaches since childhood.  They are severe diffuse but primarily bi-temporal pain with associated nausea, photophobia, phonophobia but no associated vomiting, visual disturbance, autonomic symptoms, numbness or weakness.  They have progressively gotten worse over the past year from every 2 days-every 2 weeks to daily.  She reports increased stress and anxiety.  They last hours to 2 days.  Previously would occur every 2 days to every 2 weeks.  No explainable trigger.  No known relieving factors.  She has been taking Excedrin almost daily.     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   Reports tinnitus in right ear.  Audiometric testing reportedly normal.  She started experiencing right sided pulsatile tinnitus around December 2022.  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  Denies visual obscurations or blurred vision.  Referred to ophthalmology.  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  LP on 01/19/2022 demonstrated a borderline elevated opening pressure of 23 cm water.  MRV of head on 02/04/2022 showed wasting at the bilateral transverse sigmoid junctions.  She was started on acetazolamide 250mg  twice daily.  Repeat eye exam at [facility] on 07/18/2022 revealed no optic disc or optic nerve swelling and stable OCT RNFL.  Past NSAIDS/analgesics:  Ibuprofen, Tylenol Past abortive triptans:  rizatriptan, sumatriptan tab Past abortive ergotamine:  none Past muscle relaxants:  none Past anti-emetic:   none Past antihypertensive medications:  none Past antidepressant medications:  Amitriptyline, sertraline, fluoxetine, Latuda Past anticonvulsant medications:  topiramate Past anti-CGRP:  Aimovig 140mg  Past vitamins/Herbal/Supplements:  none Past antihistamines/decongestants:  none Other past therapies:  none     Family history of headache:  No  PAST MEDICAL HISTORY: Past Medical History:  Diagnosis Date   Anxiety    Blood transfusion without reported diagnosis    Depression    Frequent headaches    GERD (gastroesophageal reflux disease)    Heart murmur    Migraine    UTI (lower urinary tract infection)     MEDICATIONS: Current Outpatient Medications on File Prior to Visit  Medication Sig Dispense Refill   acetaZOLAMIDE (DIAMOX) 250 MG tablet TAKE 1 TABLET BY MOUTH TWICE A DAY 180 tablet 1   ALPRAZolam (XANAX) 1 MG tablet Take 2 tablets (2 mg total) by mouth at bedtime. 60 tablet 5   botulinum toxin Type A (BOTOX) 200 units injection Inject 155 units IM into multiple site in the face,neck and head once every 90 days 1 each 4   DULoxetine (CYMBALTA) 30 MG capsule TAKE 3 CAPSULES BY MOUTH EVERY DAY 270 capsule 0   Lurasidone HCl 60 MG TABS TAKE 1 TABLET BY MOUTH EVERY DAY WITH SUPPER 90 tablet 1   prazosin (MINIPRESS) 2 MG capsule Take 1 capsule (2 mg total) by mouth at bedtime. 90 capsule 1   Rimegepant Sulfate (NURTEC) 75 MG TBDP Take 1 tablet (75 mg total) by mouth daily as needed. 16 tablet 5   tretinoin (RETIN-A) 0.1 % cream      zaleplon (SONATA) 10 MG capsule 1 po qhs prn sleep. May repeat 1 prn for midnocturnal awakening as long as there is 3-4 hours left to sleep. 60 capsule 5   No current facility-administered medications on file prior to visit.    ALLERGIES: Allergies  Allergen Reactions   Wellbutrin [Bupropion] Other (See Comments)    Worsened migraines    FAMILY HISTORY: Family History  Problem Relation Age of Onset   Cancer Maternal Grandmother         breast   Heart disease Maternal Grandfather    Alcohol abuse Paternal Grandfather    Dementia Paternal Grandmother    Cancer Mother        neuroendocrine cancer   Cancer Father        skin   Healthy Daughter       Objective:  *** General: No acute distress.  Patient appears well-groomed.   Head:  Normocephalic/atraumatic Neck:  Supple.  No paraspinal tenderness.  Full range of motion. Heart:  Regular rate and rhythm. Neuro:  Alert and oriented.  Speech fluent and not dysarthric.  Language intact.  CN II-XII intact.  Bulk and tone normal.  Muscle strength 5/5 throughout.  Deep tendon reflexes 2+ throughout.  Gait normal.  Romberg negative.    Shon Millet, DO  CC: Betty Swaz[place], MD

## 2023-04-06 NOTE — Telephone Encounter (Signed)
LMOVM , Please send over Eye records to be reviewed. Per last note.

## 2023-04-09 ENCOUNTER — Other Ambulatory Visit: Payer: Self-pay | Admitting: Physician Assistant

## 2023-04-10 ENCOUNTER — Ambulatory Visit: Payer: BC Managed Care – PPO | Admitting: Neurology

## 2023-04-10 ENCOUNTER — Encounter: Payer: Self-pay | Admitting: Neurology

## 2023-04-10 VITALS — BP 108/75 | HR 77 | Ht 65.0 in | Wt 209.0 lb

## 2023-04-10 DIAGNOSIS — G932 Benign intracranial hypertension: Secondary | ICD-10-CM | POA: Diagnosis not present

## 2023-04-10 DIAGNOSIS — G43709 Chronic migraine without aura, not intractable, without status migrainosus: Secondary | ICD-10-CM | POA: Diagnosis not present

## 2023-04-10 MED ORDER — QULIPTA 60 MG PO TABS: 60.0000 mg | ORAL_TABLET | Freq: Every day | ORAL | 11 refills | Status: DC

## 2023-04-13 ENCOUNTER — Telehealth: Payer: Self-pay

## 2023-04-13 NOTE — Telephone Encounter (Signed)
PA needed for Qulipta 60 mg

## 2023-04-19 ENCOUNTER — Other Ambulatory Visit (HOSPITAL_COMMUNITY): Payer: Self-pay

## 2023-04-19 ENCOUNTER — Other Ambulatory Visit (HOSPITAL_COMMUNITY): Payer: Self-pay | Admitting: Pharmacy Technician

## 2023-04-19 ENCOUNTER — Other Ambulatory Visit: Payer: Self-pay | Admitting: Neurology

## 2023-04-19 ENCOUNTER — Other Ambulatory Visit: Payer: Self-pay

## 2023-04-19 MED ORDER — BOTOX 200 UNITS IJ SOLR
INTRAMUSCULAR | 4 refills | Status: DC
  Filled 2023-04-19: qty 1, 84d supply, fill #0
  Filled 2023-05-01 – 2023-07-19 (×2): qty 1, 84d supply, fill #1
  Filled 2023-10-23 (×2): qty 1, 84d supply, fill #2
  Filled 2024-01-22: qty 1, 84d supply, fill #3

## 2023-04-19 NOTE — Progress Notes (Signed)
Specialty Pharmacy Refill Coordination Note  Yolanda Wolfe is a 47 y.o. female contacted today regarding refills of specialty medication(s) Onabotulinumtoxina   Patient requested Courier to Provider Office   Delivery date: 05/01/23   Verified address: [facility] [address], [address]   Medication will be filled on 04/28/23.  Refill Request sent to MD; call if any delays

## 2023-04-20 ENCOUNTER — Telehealth: Payer: Self-pay

## 2023-04-20 NOTE — Telephone Encounter (Signed)
*  [facility]  Pharmacy Patient Advocate Encounter   Received notification from Pt Calls Messages that prior authorization for Qulipta 60MG  tablets  is required/requested.   Insurance verification completed.   The patient is insured through [facility] .   Per test claim: PA required; PA submitted to above mentioned insurance via CoverMyMeds Key/confirmation #/EOC [facility] Status is pending

## 2023-04-20 NOTE — Telephone Encounter (Signed)
PA request has been Submitted. New Encounter created for follow up. For additional info see Pharmacy Prior Auth telephone encounter from 10/31.

## 2023-04-21 ENCOUNTER — Other Ambulatory Visit (HOSPITAL_COMMUNITY): Payer: Self-pay

## 2023-04-21 NOTE — Telephone Encounter (Signed)
Pharmacy Patient Advocate Encounter  Received notification from [facility] that Prior Authorization for Bennie Pierini has been APPROVED from 04/20/2023 to 07/20/2023

## 2023-04-26 ENCOUNTER — Ambulatory Visit: Payer: BC Managed Care – PPO | Admitting: Adult Health

## 2023-04-26 VITALS — BP 100/70 | HR 86 | Temp 98.3°F | Ht 65.0 in | Wt 209.0 lb

## 2023-04-26 DIAGNOSIS — J302 Other seasonal allergic rhinitis: Secondary | ICD-10-CM

## 2023-04-26 DIAGNOSIS — R059 Cough, unspecified: Secondary | ICD-10-CM

## 2023-04-26 DIAGNOSIS — H6993 Unspecified Eustachian tube disorder, bilateral: Secondary | ICD-10-CM

## 2023-04-26 DIAGNOSIS — J029 Acute pharyngitis, unspecified: Secondary | ICD-10-CM

## 2023-04-26 LAB — POC COVID19 BINAXNOW: SARS Coronavirus 2 Ag: NEGATIVE

## 2023-04-26 LAB — POCT RAPID STREP A (OFFICE): Rapid Strep A Screen: NEGATIVE

## 2023-04-26 MED ORDER — BENZONATATE 200 MG PO CAPS: 200.0000 mg | ORAL_CAPSULE | Freq: Three times a day (TID) | ORAL | 1 refills | Status: DC | PRN

## 2023-04-26 NOTE — Progress Notes (Signed)
Subjective:    Patient ID: Yolanda Wolfe, female    DOB: [birth year], 47 y.o.   MRN: [id number]  HPI 47 year old female who  has a past medical history of Anxiety, Blood transfusion without reported diagnosis, Depression, Frequent headaches, GERD (gastroesophageal reflux disease), Heart murmur, Migraine, and UTI (lower urinary tract infection).  She presents to the office today for an acute issue. She reports that over the last four days she has been experiencing nasal congestion, headache, PND , popping sensation in both ears, sore throat, and  semi productive cough. Her symptoms are worse at night when laying down.   She denies fevers, chills, SOB, body aches, or facial pain.   At home she has been using Mucinex DM which helped to some degree.    Review of Systems See HPI   Past Medical History:  Diagnosis Date   Anxiety    Blood transfusion without reported diagnosis    Depression    Frequent headaches    GERD (gastroesophageal reflux disease)    Heart murmur    Migraine    UTI (lower urinary tract infection)     Social History   Socioeconomic History   Marital status: Married    Spouse name: Not on file   Number of children: 1   Years of education: Not on file   Highest education level: Doctorate  Occupational History    Comment: college counselor and admissions counselor  Tobacco Use   Smoking status: Never   Smokeless tobacco: Never  Vaping Use   Vaping status: Never Used  Substance and Sexual Activity   Alcohol use: Yes    Alcohol/week: 1.0 standard drink of alcohol    Types: 1 Glasses of wine per week    Comment: maybe 1-2 monthly   Drug use: No   Sexual activity: Yes    Partners: Male    Birth control/protection: Surgical  Other Topics Concern   Not on file  Social History Narrative   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    Remarried last year, husband is Presenter, broadcasting.   She and  ex husband coparent their almost 63 yo daughter.   She grew up in [place], went to [employer], married 1st husband, traumatic birth see HPI.       Caffeine-no more than 1 per day, and not over 4 days a week.    Legal-bancruptcy when she got divorced.    Religion-Quaker.   Right handed   Social Determinants of Health   Financial Resource Strain: Low Risk  (04/26/2023)   Overall Financial Resource Strain (CARDIA)    Difficulty of Paying Living Expenses: Not hard at all  Food Insecurity: No Food Insecurity (04/26/2023)   Hunger Vital Sign    Worried About Running Out of Food in the Last Year: Never true    Ran Out of Food in the Last Year: Never true  Transportation Needs: No Transportation Needs (04/26/2023)   PRAPARE - Administrator, Civil Service (Medical): No    Lack of Transportation (Non-Medical): No  Physical Activity: Insufficiently Active (04/26/2023)   Exercise Vital Sign    Days of Exercise per Week: 2 days    Minutes of Exercise per Session: 20 min  Stress: Stress Concern Present (04/26/2023)   [employer] of Occupational Health - Occupational Stress Questionnaire    Feeling of Stress : Rather much  Social Connections: Moderately Isolated (  04/26/2023)   Social Connection and Isolation Panel [NHANES]    Frequency of Communication with Friends and Family: More than three times a week    Frequency of Social Gatherings with Friends and Family: Twice a week    Attends Religious Services: Never    Database administrator or Organizations: No    Attends Engineer, structural: Not on file    Marital Status: Married  Catering manager Violence: Not At Risk (07/31/2020)   Humiliation, Afraid, Rape, and Kick questionnaire    Fear of Current or Ex-Partner: No    Emotionally Abused: No    Physically Abused: No    Sexually Abused: No    Past Surgical History:  Procedure Laterality Date   ABDOMINAL HYSTERECTOMY  2009   Emergency right after  C-Section   CESAREAN SECTION      Family History  Problem Relation Age of Onset   Cancer Maternal Grandmother        breast   Heart disease Maternal Grandfather    Alcohol abuse Paternal Grandfather    Dementia Paternal Grandmother    Cancer Mother        neuroendocrine cancer   Cancer Father        skin   Healthy Daughter     Allergies  Allergen Reactions   Wellbutrin [Bupropion] Other (See Comments)    Worsened migraines    Current Outpatient Medications on File Prior to Visit  Medication Sig Dispense Refill   acetaZOLAMIDE (DIAMOX) 250 MG tablet TAKE 1 TABLET BY MOUTH TWICE A DAY 180 tablet 1   ALPRAZolam (XANAX) 1 MG tablet Take 2 tablets (2 mg total) by mouth at bedtime. 60 tablet 5   Atogepant (QULIPTA) 60 MG TABS Take 1 tablet (60 mg total) by mouth daily. 30 tablet 11   botulinum toxin Type A (BOTOX) 200 units injection Inject 155 units IM into multiple site in the face,neck and head once every 90 days 1 each 4   DULoxetine (CYMBALTA) 30 MG capsule TAKE 3 CAPSULES BY MOUTH EVERY DAY 270 capsule 0   Lurasidone HCl 60 MG TABS TAKE 1 TABLET BY MOUTH EVERY DAY WITH SUPPER 90 tablet 1   prazosin (MINIPRESS) 2 MG capsule Take 1 capsule (2 mg total) by mouth at bedtime. 90 capsule 1   Rimegepant Sulfate (NURTEC) 75 MG TBDP Take 1 tablet (75 mg total) by mouth daily as needed. 16 tablet 5   tretinoin (RETIN-A) 0.1 % cream      zaleplon (SONATA) 10 MG capsule 1 po qhs prn sleep. May repeat 1 prn for midnocturnal awakening as long as there is 3-4 hours left to sleep. 60 capsule 5   No current facility-administered medications on file prior to visit.    BP 100/70   Pulse 86   Temp 98.3 F (36.8 C) (Oral)   Ht 5\' 5"  (1.651 m)   Wt 209 lb (94.8 kg)   LMP  (LMP Unknown)   SpO2 97%   BMI 34.78 kg/m       Objective:   Physical Exam Vitals and nursing note reviewed.  Constitutional:      Appearance: Normal appearance.  HENT:     Right Ear: A middle ear effusion is  present. Tympanic membrane is not erythematous or bulging.     Left Ear: A middle ear effusion is present. Tympanic membrane is not erythematous or bulging.     Nose: Rhinorrhea present. No congestion. Rhinorrhea is clear.  Right Turbinates: Swollen.     Left Turbinates: Swollen.     Right Sinus: No maxillary sinus tenderness or frontal sinus tenderness.     Left Sinus: No maxillary sinus tenderness or frontal sinus tenderness.  Neurological:     Mental Status: She is alert.       Assessment & Plan:  1. Seasonal allergies - no signs of bacterial sinusitis. Likely allergy mediated from high pollen count.  - Advised Flonase and Claritin  - Follow up if symptoms worsen   2. Cough, unspecified type  - POC COVID-19- negative  - benzonatate (TESSALON) 200 MG capsule; Take 1 capsule (200 mg total) by mouth 3 (three) times daily as needed.  Dispense: 30 capsule; Refill: 1  3. Sore throat - From PND  - POC COVID-19 - POC Rapid Strep A- negative  4. ETD (Eustachian tube dysfunction), bilateral - Advised flonase    Shirline Frees, NP

## 2023-04-28 ENCOUNTER — Other Ambulatory Visit (HOSPITAL_COMMUNITY): Payer: Self-pay

## 2023-05-01 ENCOUNTER — Other Ambulatory Visit (HOSPITAL_COMMUNITY): Payer: Self-pay

## 2023-05-05 ENCOUNTER — Ambulatory Visit: Payer: BC Managed Care – PPO | Admitting: Neurology

## 2023-05-05 DIAGNOSIS — G43709 Chronic migraine without aura, not intractable, without status migrainosus: Secondary | ICD-10-CM

## 2023-05-05 MED ORDER — ONABOTULINUMTOXINA 100 UNITS IJ SOLR
200.0000 [IU] | Freq: Once | INTRAMUSCULAR | Status: AC
  Administered 2023-05-05: 155 [IU] via INTRAMUSCULAR

## 2023-05-05 NOTE — Progress Notes (Signed)

## 2023-05-29 ENCOUNTER — Encounter: Payer: Self-pay | Admitting: Physician Assistant

## 2023-05-29 ENCOUNTER — Ambulatory Visit (INDEPENDENT_AMBULATORY_CARE_PROVIDER_SITE_OTHER): Payer: BC Managed Care – PPO | Admitting: Physician Assistant

## 2023-05-29 DIAGNOSIS — F411 Generalized anxiety disorder: Secondary | ICD-10-CM | POA: Diagnosis not present

## 2023-05-29 DIAGNOSIS — F3341 Major depressive disorder, recurrent, in partial remission: Secondary | ICD-10-CM | POA: Diagnosis not present

## 2023-05-29 DIAGNOSIS — F5105 Insomnia due to other mental disorder: Secondary | ICD-10-CM | POA: Diagnosis not present

## 2023-05-29 DIAGNOSIS — F518 Other sleep disorders not due to a substance or known physiological condition: Secondary | ICD-10-CM

## 2023-05-29 DIAGNOSIS — F19981 Other psychoactive substance use, unspecified with psychoactive substance-induced sexual dysfunction: Secondary | ICD-10-CM

## 2023-05-29 DIAGNOSIS — F99 Mental disorder, not otherwise specified: Secondary | ICD-10-CM

## 2023-05-29 MED ORDER — DULOXETINE HCL 30 MG PO CPEP: 90.0000 mg | ORAL_CAPSULE | Freq: Every day | ORAL | 0 refills | Status: DC

## 2023-05-29 NOTE — Progress Notes (Signed)
[facility] Med Check  Patient ID: Yolanda Wolfe,  MRN: [id number]  PCP: [place], Betty G, MD  Date of Evaluation: 05/29/2023  Time spent: 22 minutes  Chief Complaint:  Chief Complaint   Anxiety; Depression; Insomnia; Follow-up    HISTORY/CURRENT STATUS: HPI  For routine med check.  Yolanda Wolfe feels like her current medications are working well.  Yolanda Wolfe does not feel really motivated though.  Even though it is things Yolanda Wolfe might enjoy like hanging out at the barn with her horse.  Yolanda Wolfe does not feel depressed.  Work is going well.   No extreme sadness, tearfulness, or feelings of hopelessness.  Sleeps better and Yolanda Wolfe does have weird dreams but no nightmares now since being on the prazosin.  ADLs and personal hygiene are normal.   Denies any changes in concentration, making decisions, or remembering things.  Appetite has not changed.  Weight is stable.  Anxiety is well controlled.  Yolanda Wolfe is not having panic attacks but does still get overwhelmed at times and the Xanax is beneficial.  Denies suicidal or homicidal thoughts.  Patient denies increased energy with decreased need for sleep, increased talkativeness, racing thoughts, impulsivity or risky behaviors, increased spending, increased libido, grandiosity, increased irritability or anger, paranoia, or hallucinations.  Denies dizziness, syncope, seizures, numbness, tingling, tremor, tics, unsteady gait, slurred speech, confusion. Denies muscle or joint pain, stiffness, or dystonia. Denies unexplained weight loss, frequent infections, or sores that heal slowly.  No polyphagia, polydipsia, or polyuria. Denies visual changes or paresthesias.  Yolanda Wolfe mentions sexual dysfunction.  Yolanda Wolfe has discussed this with me in the past.  Individual Medical History/ Review of Systems: Changes? :Yes will be starting Qulipta soon     Past medications for mental health diagnoses include: Amitriptylline for sleep? Celexa, Lunesta, Ativan, Zoloft, Belsomra,  Temazapam Topamax for migraines, Wellbutrin caused migraines to worsen, trazodone not effective at low doses but at 100 mg Yolanda Wolfe was too groggy, Mirtazapine caused extreme wt gain  Allergies: Wellbutrin [bupropion]  Current Medications:  Current Outpatient Medications:    acetaZOLAMIDE (DIAMOX) 250 MG tablet, TAKE 1 TABLET BY MOUTH TWICE A DAY, Disp: 180 tablet, Rfl: 1   ALPRAZolam (XANAX) 1 MG tablet, Take 2 tablets (2 mg total) by mouth at bedtime., Disp: 60 tablet, Rfl: 5   botulinum toxin Type A (BOTOX) 200 units injection, Inject 155 units IM into multiple site in the face,neck and head once every 90 days, Disp: 1 each, Rfl: 4   Lurasidone HCl 60 MG TABS, TAKE 1 TABLET BY MOUTH EVERY DAY WITH SUPPER, Disp: 90 tablet, Rfl: 1   prazosin (MINIPRESS) 2 MG capsule, Take 1 capsule (2 mg total) by mouth at bedtime., Disp: 90 capsule, Rfl: 1   Rimegepant Sulfate (NURTEC) 75 MG TBDP, Take 1 tablet (75 mg total) by mouth daily as needed., Disp: 16 tablet, Rfl: 5   Tirzepatide (MOUNJARO [place]), Inject into the skin., Disp: , Rfl:    tretinoin (RETIN-A) 0.1 % cream, , Disp: , Rfl:    zaleplon (SONATA) 10 MG capsule, 1 po qhs prn sleep. May repeat 1 prn for midnocturnal awakening as long as there is 3-4 hours left to sleep., Disp: 60 capsule, Rfl: 5   Atogepant (QULIPTA) 60 MG TABS, Take 1 tablet (60 mg total) by mouth daily. (Patient not taking: Reported on 05/29/2023), Disp: 30 tablet, Rfl: 11   benzonatate (TESSALON) 200 MG capsule, Take 1 capsule (200 mg total) by mouth 3 (three) times daily as needed. (Patient not taking: Reported on 05/29/2023),  Disp: 30 capsule, Rfl: 1   DULoxetine (CYMBALTA) 30 MG capsule, Take 3 capsules (90 mg total) by mouth daily., Disp: 270 capsule, Rfl: 0 Medication Side Effects:  H/A w/ Wellbutrin, Sexual dysfunction with Cymbalta    Family Medical/ Social History: Changes? no  MENTAL HEALTH EXAM:  There were no vitals taken for this visit.There is no height or weight on  file to calculate BMI.  General Appearance: Casual, Well Groomed, and Obese  Eye Contact:  Good  Speech:  Clear and Coherent and Normal Rate  Volume:  Normal  Mood:  Euthymic  Affect:  Congruent  Thought Process:  Goal Directed and Descriptions of Associations: Circumstantial  Orientation:  Full (Time, Place, and Person)  Thought Content: Logical   Suicidal Thoughts:  No  Homicidal Thoughts:  No  Memory:  WNL  Judgement:  Good  Insight:  Good  Psychomotor Activity:  Normal  Concentration:  Concentration: Good and Attention Span: Good  Recall:  Good  Fund of Knowledge: Good  Language: Good  Assets:  Desire for Improvement Financial Resources/Insurance Housing Resilience Transportation Vocational/Educational  ADL's:  Intact  Cognition: WNL  Prognosis:  Good   Dr. Swaziland is following labs.   [facility] test results are in chart had moderately reduced folic acid conversion  DIAGNOSES:    ICD-10-CM   1. Depression, major, recurrent, in partial remission (HCC)  F33.41     2. Generalized anxiety disorder  F41.1     3. Insomnia due to other mental disorder  F51.05    F99     4. Abnormal dreams  F51.8     5. 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  Xanax filled 05/18/2023.  Sonata filled 02/13/2023. I provided 20 minutes of face to face time during this encounter, including time spent before and after the visit in records review, medical decision making, counseling pertinent to today's visit, and charting.   Discussed the sexual dysfunction.  Yolanda Wolfe took Wellbutrin in the past which caused a headache.  With her history of migraines neither of [place] want to chance that again.  However Yolanda Wolfe is starting [place] soon so hopefully the chronic headaches will improve.  We decided to make no changes at this time, however we will see each other in approximately 6 weeks, if the migraines are  well controlled we will probably retry Wellbutrin.  If not I would recommend decreasing the Cymbalta to see if that will help with the sexual dysfunction.  We do not want to make either change right here before Christmas.  Continue Xanax 1 mg, 2 p.o. nightly as needed. Continue Cymbalta 30 mg,  3 p.o. daily.   Continue Latuda 60 mg, 1 p.o. q. evening with food. Continue prazosin 2 mg, 1 p.o. nightly.   Continue Sonata 10 mg, 1 p.o. for mid nocturnal awakening as long as Yolanda Wolfe has 3 to 4 hours left to sleep.   Return in 6 weeks.  Melony Overly, PA-C

## 2023-06-16 ENCOUNTER — Other Ambulatory Visit (HOSPITAL_BASED_OUTPATIENT_CLINIC_OR_DEPARTMENT_OTHER): Payer: Self-pay

## 2023-07-17 ENCOUNTER — Encounter: Payer: Self-pay | Admitting: Physician Assistant

## 2023-07-17 ENCOUNTER — Ambulatory Visit: Payer: 59 | Admitting: Physician Assistant

## 2023-07-17 DIAGNOSIS — F411 Generalized anxiety disorder: Secondary | ICD-10-CM

## 2023-07-17 DIAGNOSIS — F5105 Insomnia due to other mental disorder: Secondary | ICD-10-CM | POA: Diagnosis not present

## 2023-07-17 DIAGNOSIS — F331 Major depressive disorder, recurrent, moderate: Secondary | ICD-10-CM | POA: Diagnosis not present

## 2023-07-17 DIAGNOSIS — E7212 Methylenetetrahydrofolate reductase deficiency: Secondary | ICD-10-CM

## 2023-07-17 DIAGNOSIS — F99 Mental disorder, not otherwise specified: Secondary | ICD-10-CM

## 2023-07-17 MED ORDER — L-METHYLFOLATE-B6-B12 3-35-2 MG PO TABS: 1.0000 | ORAL_TABLET | Freq: Every day | ORAL | 1 refills | Status: DC

## 2023-07-17 MED ORDER — LURASIDONE HCL 80 MG PO TABS: 80.0000 mg | ORAL_TABLET | Freq: Every day | ORAL | 1 refills | Status: DC

## 2023-07-17 NOTE — Progress Notes (Signed)
[facility]  Patient ID: Yolanda Wolfe,  MRN: [id number]  PCP: [place], Betty G, MD  Date of Evaluation: 07/17/2023 Time spent:25 minutes  Chief Complaint:  Chief Complaint   Depression    HISTORY/CURRENT STATUS: HPI  For routine med check.  Not doing well. More depressed. "I feel like I'm marking time."  Not missing work but does not feel like "giving her all."  She does not enjoy anything.  She has not been to see her horse in a month or so.  She reads for pleasure but has not felt much like doing that lately.  She just sits and does nothing when she is at home.  ADLs and personal hygiene are normal.  Appetite is normal and weight is stable.  Feels numb.  Does not cry easily.  Does not sleep well.  Does get anxious at times, more of a sense of being overwhelmed.  No panic attacks.  No suicidal or homicidal thoughts.  Patient denies increased energy with decreased need for sleep, increased talkativeness, racing thoughts, impulsivity or risky behaviors, increased spending, increased libido, grandiosity, increased irritability or anger, paranoia, or hallucinations.  Migraines are better since she started [place].  Denies dizziness, syncope, seizures, numbness, tingling, tremor, tics, unsteady gait, slurred speech, confusion. Denies muscle or joint pain, stiffness, or dystonia. Denies unexplained weight loss, frequent infections, or sores that heal slowly.  No polyphagia, polydipsia, or polyuria. Denies visual changes or paresthesias.     Individual Medical History/ Review of Systems: Changes? :Yes   had covid since last visit.  Migraines have improved.  Past medications for mental health diagnoses include: Amitriptylline for sleep? Celexa, Lunesta, Ativan, Zoloft, Belsomra, Temazapam, Cymbalta Topamax for migraines, Wellbutrin caused migraines to worsen, trazodone not effective at low doses but at 100 mg she was too groggy, Mirtazapine caused extreme wt  gain  Allergies: Wellbutrin [bupropion]  Current Medications:  Current Outpatient Medications:    acetaZOLAMIDE (DIAMOX) 250 MG tablet, TAKE 1 TABLET BY MOUTH TWICE A DAY, Disp: 180 tablet, Rfl: 1   ALPRAZolam (XANAX) 1 MG tablet, Take 2 tablets (2 mg total) by mouth at bedtime., Disp: 60 tablet, Rfl: 5   Atogepant (QULIPTA) 60 MG TABS, Take 1 tablet (60 mg total) by mouth daily., Disp: 30 tablet, Rfl: 11   botulinum toxin Type A (BOTOX) 200 units injection, Inject 155 units IM into multiple site in the face,neck and head once every 90 days, Disp: 1 each, Rfl: 4   DULoxetine (CYMBALTA) 30 MG capsule, Take 3 capsules (90 mg total) by mouth daily., Disp: 270 capsule, Rfl: 0   l-methylfolate-B6-B12 (METANX) 3-35-2 MG TABS tablet, Take 1 tablet by mouth daily., Disp: 30 tablet, Rfl: 1   lurasidone (LATUDA) 80 MG TABS tablet, Take 1 tablet (80 mg total) by mouth daily with breakfast., Disp: 30 tablet, Rfl: 1   prazosin (MINIPRESS) 2 MG capsule, Take 1 capsule (2 mg total) by mouth at bedtime., Disp: 90 capsule, Rfl: 1   Rimegepant Sulfate (NURTEC) 75 MG TBDP, Take 1 tablet (75 mg total) by mouth daily as needed., Disp: 16 tablet, Rfl: 5   Tirzepatide (MOUNJARO [place]), Inject into the skin., Disp: , Rfl:    tretinoin (RETIN-A) 0.1 % cream, , Disp: , Rfl:    zaleplon (SONATA) 10 MG capsule, 1 po qhs prn sleep. May repeat 1 prn for midnocturnal awakening as long as there is 3-4 hours left to sleep., Disp: 60 capsule, Rfl: 5 Medication Side Effects:  H/A w/ Wellbutrin,  Sexual dysfunction with Cymbalta    Family Medical/ Social History: Changes? no  MENTAL HEALTH EXAM:  There were no vitals taken for this visit.There is no height or weight on file to calculate BMI.  General Appearance: Casual, Well Groomed, and Obese  Eye Contact:  Good  Speech:  Clear and Coherent and Normal Rate  Volume:  Normal  Mood:  Depressed  Affect:  Depressed  Thought Process:  Goal Directed and Descriptions of  Associations: Circumstantial  Orientation:  Full (Time, Place, and Person)  Thought Content: Logical   Suicidal Thoughts:  No  Homicidal Thoughts:  No  Memory:  WNL  Judgement:  Good  Insight:  Good  Psychomotor Activity:  Normal  Concentration:  Concentration: Good and Attention Span: Good  Recall:  Good  Fund of Knowledge: Good  Language: Good  Assets:  Desire for Improvement Financial Resources/Insurance Housing Resilience Transportation Vocational/Educational  ADL's:  Intact  Cognition: WNL  Prognosis:  Good   Dr. [place] is following labs.   GeneSight test results are in chart under Labs from 08/05/2020, had moderately reduced folic acid conversion  DIAGNOSES:    ICD-10-CM   1. Major depressive disorder, recurrent episode, moderate (HCC)  F33.1     2. Generalized anxiety disorder  F41.1     3. 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  Xanax filled 06/19/2023.  Sonata filled 02/13/2023. I provided 25 minutes of face to face time during this encounter, including time spent before and after the visit in records review, medical decision making, counseling pertinent to today's visit, and charting.   Discussed multiple options including increasing Cymbalta, increasing Latuda, switching Cymbalta to Pristiq, adding Lamictal, adding pramipexole, adding methyl folate, or restarting Wellbutrin but that gave her severe headaches in the past.  Pros and cons of each option were discussed.  I recommend increasing Latuda as she has had a lot of benefit from that drug when initially started or when dose has been increased in the past.  She agrees.  I reviewed her Gene site test results.  We discussed MTHFR.  I recommend MetanX or Deplin.  We will see if her insurance will pay for the Metanx before I send the order for Deplin to  the specialty pharmacy.  She understands that this is a medical food and not covered by some insurances.    FMLA 3 days/month if needed.  She is not sure if she wants to do that but understands to get the form to [place] if she prefers, it will cover her if she does need to call out of work sick.  Continue Xanax 1 mg, 2 p.o. nightly as needed. Continue Cymbalta 30 mg,  3 p.o. daily. Start Metanx 1 p.o. daily. Increase Latuda to 80 mg, 1 p.o. q. evening with food. Continue prazosin 2 mg, 1 p.o. nightly.   Continue Sonata 10 mg, 1 p.o. for mid nocturnal awakening as long as she has 3 to 4 hours left to sleep.   Recommend counseling. Return in 4-6 weeks.   Melony Overly, PA-C

## 2023-07-19 ENCOUNTER — Other Ambulatory Visit: Payer: Self-pay

## 2023-07-19 ENCOUNTER — Telehealth: Payer: Self-pay | Admitting: Physician Assistant

## 2023-07-19 ENCOUNTER — Other Ambulatory Visit (HOSPITAL_COMMUNITY): Payer: Self-pay

## 2023-07-19 DIAGNOSIS — Z0289 Encounter for other administrative examinations: Secondary | ICD-10-CM

## 2023-07-19 NOTE — Telephone Encounter (Signed)
Received FMLA Forms. Given to [facility] 1/29

## 2023-07-19 NOTE — Progress Notes (Signed)
Specialty Pharmacy Refill Coordination Note  Yolanda Wolfe is a 48 y.o. female contacted today regarding refills of specialty medication(s) OnabotulinumtoxinA (Botox)   Patient requested Courier to Provider Office   Delivery date: 07/26/23   Verified address: [facility] [address], [address]   Medication will be filled on 07/25/23.

## 2023-07-21 ENCOUNTER — Telehealth: Payer: Self-pay | Admitting: Pharmacy Technician

## 2023-07-21 NOTE — Telephone Encounter (Signed)
Pharmacy Patient Advocate Encounter   Received notification from CoverMyMeds that prior authorization for QULIPTA 60MG  is required/requested.   Insurance verification completed.   The patient is insured through [facility] .   Per test claim: PA required; PA submitted to above mentioned insurance via CoverMyMeds Key/confirmation #/EOC [id number] Status is pending

## 2023-07-24 ENCOUNTER — Other Ambulatory Visit (HOSPITAL_COMMUNITY): Payer: Self-pay

## 2023-07-24 NOTE — Telephone Encounter (Signed)
Pharmacy Patient Advocate Encounter  Received notification from [facility] that Prior Authorization for QULIPTA 60MG  has been APPROVED from 1.31.25 to 1.30.26. Unable to obtain price due to refill too soon rejection, last fill date 1.31.25 next available fill date 2.23.25   PA #/Case ID/Reference #: [id number]

## 2023-07-26 ENCOUNTER — Telehealth: Payer: Self-pay

## 2023-07-26 NOTE — Telephone Encounter (Signed)
 Patient reporting she is able to get to sleep, but only sleeping 4-5 hrs. Even with taking a second Sonata  she is not able to get back to sleep. Reviewed sleep hygiene with her, no issues. Reports anxiety 7/10, can be 9/10. Was panicky today about having to go to work. Depression 8/10. This is every day. She cannot provide a reason for current sx, said this has been going on since Dec. She reports taking all medications as prescribed, except Metanx. She said it was $150/mo. She said if you felt it would help she would take it, but has not thus far.   [facility] [address]

## 2023-07-27 ENCOUNTER — Other Ambulatory Visit: Payer: Self-pay | Admitting: Physician Assistant

## 2023-07-27 ENCOUNTER — Other Ambulatory Visit: Payer: Self-pay

## 2023-07-27 MED ORDER — CEREFOLIN NAC 6-90.314-2-600 MG PO TABS: 1.0000 | ORAL_TABLET | Freq: Every day | ORAL | 11 refills | Status: DC

## 2023-07-27 MED ORDER — ZOLPIDEM TARTRATE 10 MG PO TABS: 5.0000 mg | ORAL_TABLET | Freq: Every evening | ORAL | 0 refills | Status: DC | PRN

## 2023-07-27 NOTE — Telephone Encounter (Signed)
 LVM to [facility]

## 2023-07-27 NOTE — Telephone Encounter (Signed)
 Patient is agreeable to trying the Ambien  and will bring in Sonata  at appt. She would like you to send in Rx to [facility] for the Cerafolin. I can pend all if you let me know dose. I thought there might only be one dose of the Cerafolin, but I see there is more than one.   [facility] [address].

## 2023-07-27 NOTE — Telephone Encounter (Signed)
 I sent [facility]ien  to [facility] [address] and Cerafolin NAC to [facility] in [facility]

## 2023-07-27 NOTE — Telephone Encounter (Signed)
 I don't think she's ever tried Ambien . I recommend that. If she's willing to try it, have her bring the remainder of the Sonata  to her next appt for us  to dispose of.  She and I talked about Cerafolin NAC vs the Metanx. I do think it would be helpful, since ins won't cover the Metanx, I can send the Cerafolin NAC to the specialty pharmacy we send to in [facility]. We talked about it. It's approx $60/month.

## 2023-08-01 NOTE — Telephone Encounter (Signed)
FMLA completed and given to Rosey Bath to review and sign

## 2023-08-02 ENCOUNTER — Ambulatory Visit: Payer: 59 | Admitting: Physician Assistant

## 2023-08-02 NOTE — Telephone Encounter (Signed)
Paperwork faxed on 08/01/23

## 2023-08-04 ENCOUNTER — Ambulatory Visit: Payer: 59 | Admitting: Neurology

## 2023-08-04 DIAGNOSIS — G43709 Chronic migraine without aura, not intractable, without status migrainosus: Secondary | ICD-10-CM | POA: Diagnosis not present

## 2023-08-04 MED ORDER — ONABOTULINUMTOXINA 100 UNITS IJ SOLR
200.0000 [IU] | Freq: Once | INTRAMUSCULAR | Status: AC
  Administered 2023-08-04: 155 [IU] via INTRAMUSCULAR

## 2023-08-04 NOTE — Progress Notes (Signed)

## 2023-08-17 ENCOUNTER — Other Ambulatory Visit: Payer: Self-pay | Admitting: Physician Assistant

## 2023-08-24 ENCOUNTER — Other Ambulatory Visit (HOSPITAL_COMMUNITY): Payer: Self-pay

## 2023-08-24 ENCOUNTER — Telehealth: Payer: Self-pay | Admitting: Pharmacy Technician

## 2023-08-24 NOTE — Telephone Encounter (Signed)
 Pharmacy Patient Advocate Encounter  [employer] verification has been submitted. Benefit Verification #:  [id number]  Pharmacy PA has been submitted for BOTOX 200u via CoverMyMeds. INSURANCE: [facility] [employer] DATE SUBMITTED: 3.6.25 KEY: [id number] FAX: [phone] Status is pending

## 2023-08-25 ENCOUNTER — Other Ambulatory Visit (HOSPITAL_COMMUNITY): Payer: Self-pay

## 2023-08-25 NOTE — Telephone Encounter (Signed)
 Fax received with approval from [facility] [employer] for Botox 200 units. Approved 08/24/23-08/24/23.

## 2023-08-31 ENCOUNTER — Encounter: Payer: Self-pay | Admitting: Physician Assistant

## 2023-08-31 ENCOUNTER — Ambulatory Visit (INDEPENDENT_AMBULATORY_CARE_PROVIDER_SITE_OTHER): Payer: 59 | Admitting: Physician Assistant

## 2023-08-31 DIAGNOSIS — F411 Generalized anxiety disorder: Secondary | ICD-10-CM | POA: Diagnosis not present

## 2023-08-31 DIAGNOSIS — F99 Mental disorder, not otherwise specified: Secondary | ICD-10-CM

## 2023-08-31 DIAGNOSIS — F5105 Insomnia due to other mental disorder: Secondary | ICD-10-CM | POA: Diagnosis not present

## 2023-08-31 DIAGNOSIS — E7212 Methylenetetrahydrofolate reductase deficiency: Secondary | ICD-10-CM

## 2023-08-31 DIAGNOSIS — F329 Major depressive disorder, single episode, unspecified: Secondary | ICD-10-CM

## 2023-08-31 MED ORDER — ZOLPIDEM TARTRATE 10 MG PO TABS
5.0000 mg | ORAL_TABLET | Freq: Every evening | ORAL | 0 refills | Status: DC | PRN
Start: 2023-08-31 — End: 2023-10-20

## 2023-08-31 MED ORDER — DULOXETINE HCL 60 MG PO CPEP: 120.0000 mg | ORAL_CAPSULE | Freq: Every day | ORAL | 1 refills | Status: DC

## 2023-08-31 NOTE — Progress Notes (Signed)
 [facility]  Patient ID: Yolanda Wolfe,  MRN: [id number]  PCP: [place], Betty G, MD  Date of Evaluation: 08/31/2023 Time spent:35 minutes  Chief Complaint:  Chief Complaint   Anxiety; Depression; Insomnia; Follow-up    HISTORY/CURRENT STATUS: HPI  For routine med check. Not doing well.  "I'm not sleeping at all. Last night it was 6 AM and got up at 11. The night before it was 3 AM." Sonata isn't working. Drinks no caffeine most days, but if she does, it's before 1 PM. States she's off electronic devices by 8 PM, sometimes reads a physical book.  Racing thoughts.  "Stupid stuff. Random things like commercials." She didn't get Ambien, not sure of the reason, but [facility].   Is very depressed. Doesn't feel like meds are working. This has been going on for a long time. Anhedonia. Only enough energy for work but that's all. Personal hygiene suffers sometimes. ADLs are limited to the things that must be done.   Denies any changes in concentration, making decisions, or remembering things.  Appetite has not changed. Gets anxious still, usually worse in the evenings. Takes xanax then but even with that, she can't turn her mind off to go to sleep.  Denies suicidal or homicidal thoughts, but 'i wouldn't care if something happened to me.'.  Patient denies increased energy with decreased need for sleep, increased talkativeness, racing thoughts, impulsivity or risky behaviors, increased spending, increased libido, grandiosity, increased irritability or anger, paranoia, or hallucinations.  .Review of Systems  Constitutional:  Positive for malaise/fatigue.  HENT: Negative.    Eyes: Negative.   Respiratory: Negative.    Cardiovascular: Negative.   Gastrointestinal: Negative.   Genitourinary: Negative.   Musculoskeletal: Negative.   Skin: Negative.   Neurological: Negative.   Endo/Heme/Allergies: Negative.   Psychiatric/Behavioral:         See HPI    Individual Medical History/ Review of Systems: Changes? :No     Past medications for mental health diagnoses include: Amitriptylline for sleep? Celexa,  Zoloft, Cymbalta, Wellbutrin caused migraines to worsen,  Lunesta, Ativan, Belsomra, Temazapam,  trazodone not effective at low doses but at 100 mg she was too groggy, Mirtazapine caused extreme wt gain, Sonata ineffective, Ambien  Topamax for migraines, somewhat effective  Allergies: Wellbutrin [bupropion]  Current Medications:  Current Outpatient Medications:    acetaZOLAMIDE (DIAMOX) 250 MG tablet, TAKE 1 TABLET BY MOUTH TWICE A DAY, Disp: 180 tablet, Rfl: 1   ALPRAZolam (XANAX) 1 MG tablet, TAKE 2 TABLETS (2 MG TOTAL) BY MOUTH AT BEDTIME., Disp: 60 tablet, Rfl: 0   Atogepant (QULIPTA) 60 MG TABS, Take 1 tablet (60 mg total) by mouth daily., Disp: 30 tablet, Rfl: 11   botulinum toxin Type A (BOTOX) 200 units injection, Inject 155 units IM into multiple site in the face,neck and head once every 90 days, Disp: 1 each, Rfl: 4   DULoxetine (CYMBALTA) 60 MG capsule, Take 2 capsules (120 mg total) by mouth daily., Disp: 60 capsule, Rfl: 1   lurasidone (LATUDA) 80 MG TABS tablet, Take 1 tablet (80 mg total) by mouth daily with breakfast., Disp: 30 tablet, Rfl: 1   Methylfol-Algae-B12-Acetylcyst (CEREFOLIN NAC) 6-90.314-2-600 MG TABS, Take 1 tablet by mouth daily., Disp: 30 tablet, Rfl: 11   prazosin (MINIPRESS) 2 MG capsule, Take 1 capsule (2 mg total) by mouth at bedtime., Disp: 90 capsule, Rfl: 1   Rimegepant Sulfate (NURTEC) 75 MG TBDP, Take 1 tablet (75 mg total) by  mouth daily as needed., Disp: 16 tablet, Rfl: 5   Tirzepatide (MOUNJARO [place]), Inject into the skin., Disp: , Rfl:    tretinoin (RETIN-A) 0.1 % cream, , Disp: , Rfl:    zolpidem (AMBIEN) 10 MG tablet, Take 0.5-1 tablets (5-10 mg total) by mouth at bedtime as needed for sleep., Disp: 30 tablet, Rfl: 0 Medication Side Effects:  H/A w/ Wellbutrin, Sexual dysfunction with Cymbalta     Family Medical/ Social History: Changes? no  MENTAL HEALTH EXAM:  There were no vitals taken for this visit.There is no height or weight on file to calculate BMI.  General Appearance: Casual, Well Groomed, and Obese  Eye Contact:  Good  Speech:  Clear and Coherent and Normal Rate  Volume:  Normal  Mood:  Depressed  Affect:  Depressed  Thought Process:  Goal Directed and Descriptions of Associations: Circumstantial  Orientation:  Full (Time, Place, and Person)  Thought Content: Logical   Suicidal Thoughts:  No  Homicidal Thoughts:  No  Memory:  WNL  Judgement:  Good  Insight:  Good  Psychomotor Activity:  Normal  Concentration:  Concentration: Good and Attention Span: Good  Recall:  Good  Fund of Knowledge: Good  Language: Good  Assets:  Communication Skills Desire for Improvement Financial Resources/Insurance Housing Resilience Transportation Vocational/Educational  ADL's:  Intact  Cognition: WNL  Prognosis:  Good   Dr. [place] is following labs.   GeneSight test results are in chart under Labs from 08/05/2020, had moderately reduced folic acid conversion  ECT-MADRS    Flowsheet Row Office Visit from 08/31/2023 in [facility]  MADRS Total Score 38      GAD-7    Flowsheet Row Office Visit from 07/25/2022 in [facility] [place] Office Visit from 07/31/2020 in [facility]  Total GAD-7 Score 5 13      PHQ2-9    Flowsheet Row Office Visit from 07/25/2022 in [facility] [place] Office Visit from 03/30/2022 in [facility] [place] Video Visit from 02/25/2022 in [facility] [place] Office Visit from 07/31/2020 in [facility]  PHQ-2 Total Score 2 6 2 1   PHQ-9 Total Score 10 23 10 9       Epworth Sleep scale=15  DIAGNOSES:    ICD-10-CM   1. Treatment-resistant depression  F32.9     2.  Insomnia due to other mental disorder  F51.05    F99     3. 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  Xanax filled 08/18/2023.  Sonata fill 07/14/2023.  I provided 35 minutes of face to face time during this encounter, including time spent before and after the visit in records review, medical decision making, counseling pertinent to today's visit, and charting.   Discussed options for TRD including Lamictal, lithium, increasing Cymbalta, increasing Latuda, TMS, Spravato. She's interested in Spravato. We discussed it at length benefits, risks, and possible SE.  She understands she'll be monitored in the clinic for at least 2 hours.  Will need a driver upon discharge. I'll discuss this with the office manager, Napoleon Form, and the [facility] clinic nurse, Gwenyth Ober, about this.  Will get her started asap.  Recommend sleep study. Will see if her neuro does sleep medicine, if not, will refer to  sleep medicine. Sleep hygiene discussed.  Continue Xanax 1 mg, 2 p.o. nightly as needed. Increase cymbalta to 60 mg, 2 every day.  Continue Cerafolin NAC, 1 daily.  Continue  Latuda  80 mg, 1 p.o. q. evening with food. Continue prazosin 2 mg, 1 p.o. nightly.   Continue Ambien 10 mg, 1 at bedtime prn.  Referred to Rockne Menghini, LCSW for counseling. Return in 6 weeks.  Melony Overly, PA-C

## 2023-09-13 ENCOUNTER — Other Ambulatory Visit: Payer: Self-pay | Admitting: Physician Assistant

## 2023-09-14 ENCOUNTER — Telehealth: Payer: Self-pay | Admitting: Physician Assistant

## 2023-09-14 NOTE — Telephone Encounter (Signed)
 Noted, thank you

## 2023-09-14 NOTE — Telephone Encounter (Signed)
 Referral for Sleep Medicine has been faxed to [facility]. Pt is aware.

## 2023-09-17 ENCOUNTER — Other Ambulatory Visit: Payer: Self-pay | Admitting: Adult Health

## 2023-09-19 ENCOUNTER — Ambulatory Visit: Admitting: Psychiatry

## 2023-09-19 DIAGNOSIS — F329 Major depressive disorder, single episode, unspecified: Secondary | ICD-10-CM

## 2023-09-19 NOTE — Progress Notes (Signed)
 [facility] Counsel[facility] Initial Adult Exam  Name: Yolanda Wolfe Date: 09/19/2023 MRN: [id number] DOB: 15-Jul-1975 PCP: [place], Betty G, MD  Time spent: 55 minutes   Guardian/Payee:  patient    Paperwork requested:  No   Reason for Visit /Presenting Problem:  anxiety, depression, insomnia, lack of motivation in daily activities/work    Mental Status Exam:    Appearance:   Casual and Neat     Behavior:  Appropriate, Sharing, Motivated, and explains she is motivated to get better but not to do daily tasks of living  Motor:  Normal  Speech/Language:   Clear and Coherent  Affect:  Depressed and anxious  Mood:  anxious and depressed  Thought process:  goal directed  Thought content:    WNL  Sensory/Perceptual disturbances:    WNL  Orientation:  oriented to person, place, time/date, situation, day of week, month of year, year, and stated date of September 19, 2023  Attention:  Fair  Concentration:  Good  Memory:  WNL  Fund of knowledge:   Good  Insight:    Good  Judgment:   Good  Impulse Control:  Good   Reported Symptoms:  see info above  Risk Assessment: Danger to Self:  No Self-injurious Behavior: No Danger to Others: No Duty to Warn:no Physical Aggression / Violence:No  Access to Firearms a concern: No  Gang Involvement:No  Patient / guardian was educated about steps to take if suicide or homicide risk level increases between visits: yes, no current thoughts of harm to self or others and agrees to report any such thoughts if she experiences them. While future psychiatric events cannot be accurately predicted, the patient does not currently require acute inpatient psychiatric care and does not currently meet [facility] involuntary commitment criteria.  Substance Abuse History: Current substance abuse: No     Past Psychiatric History:   Previous psychological history is significant for anxiety and depression Outpatient Providers: Archer Asa (psychiatrist),  another therapist History of Psych Hospitalization: No  Psychological Testing:  n/a    Abuse History: Victim of No.,  none    Report needed: No. Victim of Neglect:No. Perpetrator of  n/a   Witness / Exposure to Domestic Violence: No   Protective Services Involvement: No  Witness to [employer] Violence:  No   Family History: Patient confirms this info. Family History  Problem Relation Age of Onset   Cancer Maternal Grandmother        breast   Heart disease Maternal Grandfather    Alcohol abuse Paternal Grandfather    Dementia Paternal Grandmother    Cancer Mother        neuroendocrine cancer   Cancer Father        skin   Healthy Daughter     Living situation: the patient lives with their family. Lives with current husband and has (1 child) 103 yr old daughter half the time. First marriage lasted 13 yrs, and current marriage has been for 4 yrs thus far. Patient states that she and current husband get along well and work together for the sake of their daughter.   Sexual Orientation:  Straight  Relationship Status: married  Name of spouse / other:n/a             If a parent, number of children / ages:16 yr old daughter  Support Systems; spouse friends parents  Financial Stress:  No   Income/Employment/Disability: Employment -teaches at [employer]; used to like my job but my anxiety and depression now  causes problems for me at work but Printmaker Service: No   Educational History: Education:  Licensed conveyancer- doctorate  Religion/Sprituality/World View:   Protestant  Any cultural differences that may affect / interfere with treatment:  not applicable   Recreation/Hobbies: travel, reading  Stressors:Occupational concerns    Strengths:  Supportive Relationships, Family, Friends, Journalist, newspaper, Able to Communicate Effectively  Barriers:  "I'm so entrenched in this current depression and I'm having some fear and panic."   Legal History: Pending legal issue /  charges: The patient has no significant history of legal issues. History of legal issue / charges:  none  Medical History/Surgical History:confirmed by patient. Past Medical History:  Diagnosis Date   Anxiety    Blood transfusion without reported diagnosis    Depression    Frequent headaches    GERD (gastroesophageal reflux disease)    Heart murmur    Migraine    UTI (lower urinary tract infection)     Past Surgical History:  Procedure Laterality Date   ABDOMINAL HYSTERECTOMY  2009   Emergency right after C-Section   CESAREAN SECTION      Medications: Current Outpatient Medications  Medication Sig Dispense Refill   acetaZOLAMIDE (DIAMOX) 250 MG tablet TAKE 1 TABLET BY MOUTH TWICE A DAY 180 tablet 1   ALPRAZolam (XANAX) 1 MG tablet TAKE 2 TABLETS (2 MG TOTAL) BY MOUTH AT BEDTIME. 60 tablet 0   Atogepant (QULIPTA) 60 MG TABS Take 1 tablet (60 mg total) by mouth daily. 30 tablet 11   botulinum toxin Type A (BOTOX) 200 units injection Inject 155 units IM into multiple site in the face,neck and head once every 90 days 1 each 4   DULoxetine (CYMBALTA) 60 MG capsule Take 2 capsules (120 mg total) by mouth daily. 60 capsule 1   lurasidone (LATUDA) 80 MG TABS tablet TAKE 1 TABLET (80 MG TOTAL) BY MOUTH DAILY WITH BREAKFAST. 30 tablet 1   Methylfol-Algae-B12-Acetylcyst (CEREFOLIN NAC) 6-90.314-2-600 MG TABS Take 1 tablet by mouth daily. 30 tablet 11   prazosin (MINIPRESS) 2 MG capsule Take 1 capsule (2 mg total) by mouth at bedtime. 90 capsule 1   Rimegepant Sulfate (NURTEC) 75 MG TBDP Take 1 tablet (75 mg total) by mouth daily as needed. 16 tablet 5   Tirzepatide (MOUNJARO [place]) Inject into the skin.     tretinoin (RETIN-A) 0.1 % cream      zolpidem (AMBIEN) 10 MG tablet Take 0.5-1 tablets (5-10 mg total) by mouth at bedtime as needed for sleep. 30 tablet 0   No current facility-administered medications for this visit.    Allergies  Allergen Reactions   Wellbutrin [Bupropion] Other  (See Comments)    Worsened migraines    Diagnoses:  No diagnosis found.  Treatment goal plan of care: "I want to feel better and happier and more positive more of the time." Worked collaboratively with patient on her treatment plan and she is in agreement with it.  Patient is being provided a copy of her goals, which she is signing.  Goals remain on treatment plan as patient works with strategies in sessions and outside of sessions to meet her goals.  Progress will be assessed each session and documented in the "plan" or "progress" sections of treatment note.  Identify life conflicts and/or situations including from her past and in the present, that support her current symptomology of anxiety and depression. Develop behavioral and cognitive strategies to reduce or eliminate excessive anxiety and depression.  Identify,  challenge, and replace negative self talk with more positive, realistic, and empowering self talk. Patient will develop healthier self talk as a means of feeling better about herself and better managing her anxiety and depression. Patient to increase daily social experiences and work on strengthening a new non-avoidant approach and build self-confidence. Patient to work on developing reality based, positive cognitive messages.   Plan of Care:  Today is the first session for this patient with this therapist for individual therapy.  Lacey Wallman is a 48 year old female patient referred by med provider Melony Overly, PA-C.  Marijo lives with her current husband of 4 years, and patient's 73 year old daughter from a first marriage that lasted 13 years, also lives with patient and her husband.  Current husband of patient gets along well with patient's daughter.  Patient's former husband, and patient, and patient's current husband, all get along and communicate well with each other which is a real strength for patient and her situation.  Patient reports she feels supported by her  family and coworkers who are understanding of her needs.  She denies any substance abuse.  Previously saw Dr. Archer Asa (psychiatrist) and a therapist at that same practice.  Motivation is a significant issue for patient as she states she is motivated to get better mentally/emotionally but not to do daily tasks of living.  She presents with a rather flat affect today however later on in our time together she slightly smiled a few times.  I did feel that she was engaged in our conversation particularly talking about her history, some appropriate goals for her, motivational challenges, and changes she is wanting for herself.  She is casual and neat in her appearance, behavior is appropriate and motivated in terms of wanting to get better but not necessarily performing daily tasks of living, motor skills are normal, speech and language is clear and coherent, affect and mood are both depressed and anxious, thought process is goal-directed, she is oriented to person/place/time/date/situation/day of week/month of year/year/and today's date of September 19, 2023.  Her concentration is good and memory seems to be within normal limits.  Has good insight, judgment, and impulse control.  Denies any self-injurious behavior and states that she has no thoughts to harm herself or anyone else.  Has a few friends that she feels close to but is not very social.  Has good relationship with her teenage daughter.  Some occupational concerns.  Enjoys travel and reading.  One of her fears is that "I am so entrenched in this current depression that I am having some fear and panic.  Shares that the fear is greater but panic will sometimes surface although not extreme.  Denies any significant legal issues.  She is on medications currently through med provider Melony Overly, PC-C here at our practice.  Worked well on her treatment goal plan together and she is in agreement with it.  Husband is supportive and works from home.  Patient shares  that her depression started soon after birth of her only child 16 years ago and that anxiety started about 5 years ago but "not sure why".  Experiences some loneliness.  Difficulty sleeping as it is inconsistent and sometimes has weird dreams.  She does seem some motivation although we need to work to strengthen that motivation and we began that process today in session.  Clearly states towards end of session "I want to feel better and happier and more positive more of the time.  Encouraged patient to continue some  strategies we discussed as far as motivation including some writing in between sessions about specific topic, not just generic journaling, and she is in agreement.  Will see her next session within 2 weeks.  Review of initial treatment goal plan and patient is in agreement.  Return for next appointment within 2 weeks.   Mathis Fare, LCSW

## 2023-09-23 ENCOUNTER — Other Ambulatory Visit: Payer: Self-pay | Admitting: Physician Assistant

## 2023-09-26 ENCOUNTER — Other Ambulatory Visit: Payer: Self-pay | Admitting: Physician Assistant

## 2023-09-28 ENCOUNTER — Ambulatory Visit: Admitting: Psychiatry

## 2023-09-28 DIAGNOSIS — F329 Major depressive disorder, single episode, unspecified: Secondary | ICD-10-CM

## 2023-09-28 NOTE — Progress Notes (Signed)
 [facility] Counselor/Therapist Progress Note  Patient ID: Yolanda Wolfe, MRN: [id number],    Date: 09/28/2023  Time Spent: 53 minutes   Treatment Type: Individual Therapy  Reported Symptoms: anxiety, depression,insomnia, motivation issues in daily activities and work slightly better   Mental Status Exam:  Appearance:   Casual and Neat     Behavior:  Appropriate, Sharing, and Motivated  Motor:  Normal  Speech/Language:   Clear and Coherent  Affect:  Anxiety, depression  Mood:  anxious and depressed  Thought process:  goal directed  Thought content:    WNL  Sensory/Perceptual disturbances:    WNL  Orientation:  oriented to person, place, time/date, situation, day of week, month of year, year, and stated date of September 28, 2023  Attention:  Good  Concentration:  Good  Memory:  WNL  Fund of knowledge:   Good  Insight:    Good  Judgment:   Good  Impulse Control:  Good   Risk Assessment: Danger to Self:  No Self-injurious Behavior: No Danger to Others: No Duty to Warn:no Physical Aggression / Violence:No  Access to Firearms a concern: No  Gang Involvement:No   Subjective: Patient in today reporting depression and anxiety, some improvement in motivation!  Feeling some more positive about herself and situation. Had good weekend, daughter's 16th birthday celebration. Good support system within family and co-workers. Spoke up more today and a little more proactive. Has lost some weight and feeling positive about that. Has a big trip coming up out of the country ([place] next week and looking forward to that with family. Affect not as flat. Worked more today with some positive re-framing and positive visualizations that are helping some.  Having a better mindset is important for her and she can already notice some difference.  Choosing to have things to look forward to. Working to "let go" more when bad things do occur. Having moments where I notice feeling  different/better. Getting out doing things more. Does enjoy traveling. I worry about my daughter, age 7, and her driving soon makes me anxious. Concerned also about husband's work as he never seems "caught up". Worked further on her review of herself and making it more positive as well as review of others and certain situations.  Not feeling as "entrenched" in her depression but "I know some of it is still there and that is okay because I knew it would not be a sudden change".  Her depression reportedly began soon after the birth of her daughter who is now 40 years old, and she reports that her anxiety got more chronic at about 5 years ago and she is "not sure why" on that.  Sleep seem to be a little better more recently but she has had some sleep problems and sometimes has "weird dreams".  Motivation level is noticeably higher this session which is patient's second session.  Continues to want to "feel better and happier and more positive more of the time".  Encouraged patient to be following up on strategies we discussed in sessions, doing things that support her motivation improvement, and encouraged journaling in between sessions.  Will see her again in 2 weeks.   Interventions: Cognitive Behavioral Therapy and Ego-Supportive Identify life conflicts and/or situations including from her past and in the present, that support her current symptomology of anxiety and depression. Develop behavioral and cognitive strategies to reduce or eliminate excessive anxiety and depression.  Identify, challenge, and replace negative self talk with more  positive, realistic, and empowering self talk. Patient will develop healthier self talk as a means of feeling better about herself and better managing her anxiety and depression. Patient to increase daily social experiences and work on strengthening a new non-avoidant approach and build self-confidence. Patient to work on developing reality based, positive cognitive  messages.  Diagnosis:   ICD-10-CM   1. Treatment-resistant depression  F32.9      Plan: Patient in session today for her second visit and showing some improvement in her mood.  Some decrease in depression and anxiety but reports she is still working on treatment goals to move in a more positive direction.  Insomnia not quite as bad more recently.  Is showing some progress and motivation and that is encouraging for her.  Has a family trip planned out of the country this coming week and will be returning for next session the week after.  Goal review and progress/challenges noted with patient.  Next appointment within 2 weeks.   Mathis Fare, LCSW

## 2023-10-04 NOTE — Telephone Encounter (Signed)
 Pharmacy Patient Advocate Encounter  Received notification from [facility] that Prior Authorization for BOTOX 200 has been APPROVED from 3.6.25 to 3.6.26   PA #/Case ID/Reference #: [id number]

## 2023-10-08 NOTE — Progress Notes (Signed)
 NEUROLOGY FOLLOW UP OFFICE NOTE  Yolanda Wolfe [id number]  Assessment/Plan:   Migraine without aura without status migrainosus, not intractable  Idiopathic intracranial hypertension Tic vs tremor, possibly anxiety-related.  Unlikely secondary to acetazolamide .     For further evaluation of tics/tremor, check CMP and TSH. Migraine prevention:  Botox , Qulipta  60mg  daily (she would rather avoid injections) Migraine rescue:  Nurtec.  Zofran  for nausea  Acetazolamide  187.5mg  twice daily.  She has follow up eye exam in the summer. Will discontinue if stable. Limit use of pain relievers to no more than 2 days out of week to prevent risk of rebound or medication-overuse headache. Keep headache diary Follow up 6 months.  Subjective:  Yolanda Wolfe is a 48 year old right-handed female with depression and anxiety who follows up for migraines.   UPDATE: Added Qulipta  in conjunction with Botox .   Doing much better.  Intensity:  mostly mild to moderate Duration:  1 hour with Nurtec when effective  Frequency: Jan 5, Feb 4, March 3  In December, repeat eye exam revealed some edema.  Increased acetazolamide  125mg  to 1.5 tablets twice daily and she lost 30 lbs.  Has follow up in the next 1 to 2 months.   No longer having the paresthesias with acetazolamide  but over the past 2-3 months she notes that her hands and legs will start shaking (left worse than right).  When she eats with utensils, hand may start to shake.  She had increase in anxiety in December and January but it has been improving.    Frequency of abortive medication: 7 days this month. Current NSAIDS/analgesics:  Excedrin Current triptans:  none Current ergotamine:  none Current anti-emetic:  Zofran  ODT 4mg  Current muscle relaxants:  none Current Antihypertensive medications:  none Current Antidepressant/antipsychotic medications:  Duloxetine  120mg  daily Current Anticonvulsant medications:  none Current  anti-CGRP:  Nurtec PRN, Qulipta  60mg  daily Current Antihistamines/Decongestants:  none Other therapy:  Botox  Hormone/birth control:  none Other medications:  Acetazolamide  187.5mg  BID, alprazolam  2mg  at bedtime, Latuda    Caffeine:  Sweet tea.  No coffee Diet:  80 oz water daily.  Opto diet.  Does not skip meals.  Optavia Diet. Exercise:  no Depression:  yes; Anxiety:  yes Other pain:  no Sleep:  Poor.  Lorazepam  helps her stay asleep.  Wakes up tired.  Sleep apnea ruled out.    HISTORY:  Headaches since childhood.  They are severe diffuse but primarily bi-temporal pain with associated nausea, photophobia, phonophobia but no associated vomiting, visual disturbance, autonomic symptoms, numbness or weakness.  They have progressively gotten worse over the past year from every 2 days-every 2 weeks to daily.  She reports increased stress and anxiety.  They last hours to 2 days.  Previously would occur every 2 days to every 2 weeks.  No explainable trigger.  No known relieving factors.  She has been taking Excedrin almost daily.     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   Reports tinnitus in right ear.  Audiometric testing reportedly normal.  She started experiencing right sided pulsatile tinnitus around December 2022.  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  Denies visual obscurations or blurred vision.  Referred to  ophthalmology.  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  LP on 01/19/2022 demonstrated a borderline elevated opening pressure of 23 cm water.  MRV of head on 02/04/2022 showed wasting  at the bilateral transverse sigmoid junctions.  She was started on acetazolamide  250mg  twice daily.  Repeat eye exam [facility] on 07/18/2022 revealed no optic disc or optic nerve swelling and stable OCT RNFL.       Past NSAIDS/analgesics:  Ibuprofen , Tylenol  Past abortive triptans:  rizatriptan , sumatriptan  tab Past abortive ergotamine:  none Past muscle relaxants:  none Past anti-emetic:  none Past antihypertensive medications:  none Past antidepressant medications:  Amitriptyline , sertraline , fluoxetine , Latuda  Past anticonvulsant medications:  topiramate  Past anti-CGRP:  Aimovig  140mg , Ubrelvy  100mg  Past vitamins/Herbal/Supplements:  none Past antihistamines/decongestants:  none Other past therapies:  none     Family history of headache:  No  PAST MEDICAL HISTORY: Past Medical History:  Diagnosis Date   Anxiety    Blood transfusion without reported diagnosis    Depression    Frequent headaches    GERD (gastroesophageal reflux disease)    Heart murmur    Migraine    UTI (lower urinary tract infection)     MEDICATIONS: Current Outpatient Medications on File Prior to Visit  Medication Sig Dispense Refill   acetaZOLAMIDE  (DIAMOX ) 250 MG tablet TAKE 1 TABLET BY MOUTH TWICE A DAY 180 tablet 1   ALPRAZolam  (XANAX ) 1 MG tablet TAKE 2 TABLETS BY MOUTH AT BEDTIME 60 tablet 1   Atogepant  (QULIPTA ) 60 MG TABS Take 1 tablet (60 mg total) by mouth daily. 30 tablet 11   botulinum toxin Type A  (BOTOX ) 200 units injection Inject 155 units IM into multiple site in the face,neck and head once every 90 days 1 each 4   DULoxetine  (CYMBALTA ) 60 MG capsule Take 2 capsules (120 mg total) by mouth daily. 60 capsule 1   lurasidone  (LATUDA ) 80 MG TABS tablet TAKE 1 TABLET (80 MG TOTAL) BY MOUTH DAILY WITH BREAKFAST. 30 tablet 1   Methylfol-Algae-B12-Acetylcyst (CEREFOLIN NAC) 6-90.314-2-600 MG TABS Take 1 tablet by mouth daily. 30 tablet 11   prazosin  (MINIPRESS ) 2 MG capsule TAKE 1 CAPSULE BY MOUTH  AT BEDTIME. 90 capsule 0   Rimegepant Sulfate (NURTEC) 75 MG TBDP Take 1 tablet (75 mg total) by mouth daily as needed. 16 tablet 5   Tirzepatide (MOUNJARO [place]) Inject into the skin.     tretinoin (RETIN-A) 0.1 % cream      zolpidem  (AMBIEN ) 10 MG tablet Take 0.5-1 tablets (5-10 mg total) by mouth at bedtime as needed for sleep. 30 tablet 0   No current facility-administered medications on file prior to visit.    ALLERGIES: Allergies  Allergen Reactions   Wellbutrin  [Bupropion ] Other (See Comments)    Worsened migraines    FAMILY HISTORY: Family History  Problem Relation Age of Onset   Cancer Maternal Grandmother        breast   Heart disease Maternal Grandfather    Alcohol abuse Paternal Grandfather    Dementia Paternal Grandmother    Cancer Mother        neuroendocrine cancer   Cancer Father        skin   Healthy Daughter       Objective:  Blood pressure 114/80, pulse 85, height 5\' 4"  (1.626 m), weight 172 lb (78 kg), SpO2 98%. General: No acute distress.  Patient appears well-groomed.   Head:  Normocephalic/atraumatic Neck:  Supple.  No paraspinal tenderness.  Full range of motion. Heart:  Regular rate and rhythm. Neuro:  alert and oriented.  Speech fluent and not dysarthric, language intact.  CN II-XII intact. Bulk and tone normal, muscle strength 5/5 throughout.  Sensation to light touch intact.  Deep tendon reflexes 2+ throughout.  Finger to nose testing intact.  Gait normal, Romberg negative.     Janne Members, DO  CC: Betty [place], MD

## 2023-10-09 ENCOUNTER — Ambulatory Visit: Payer: BC Managed Care – PPO | Admitting: Neurology

## 2023-10-09 ENCOUNTER — Encounter: Payer: Self-pay | Admitting: Neurology

## 2023-10-09 ENCOUNTER — Other Ambulatory Visit

## 2023-10-09 VITALS — BP 114/80 | HR 85 | Ht 64.0 in | Wt 172.0 lb

## 2023-10-09 DIAGNOSIS — G932 Benign intracranial hypertension: Secondary | ICD-10-CM

## 2023-10-09 DIAGNOSIS — G43009 Migraine without aura, not intractable, without status migrainosus: Secondary | ICD-10-CM

## 2023-10-09 DIAGNOSIS — R251 Tremor, unspecified: Secondary | ICD-10-CM | POA: Diagnosis not present

## 2023-10-09 NOTE — Patient Instructions (Addendum)
 Continue Botox  and Qulipta  Nurtec and Zofran  as needed Check labs:  CMP, TSH Follow up 6 months.

## 2023-10-10 ENCOUNTER — Encounter: Payer: Self-pay | Admitting: Neurology

## 2023-10-10 LAB — COMPREHENSIVE METABOLIC PANEL WITH GFR
AG Ratio: 1.7 (calc) (ref 1.0–2.5)
ALT: 10 U/L (ref 6–29)
AST: 12 U/L (ref 10–35)
Albumin: 4.6 g/dL (ref 3.6–5.1)
Alkaline phosphatase (APISO): 70 U/L (ref 31–125)
BUN: 17 mg/dL (ref 7–25)
CO2: 25 mmol/L (ref 20–32)
Calcium: 10.3 mg/dL — ABNORMAL HIGH (ref 8.6–10.2)
Chloride: 105 mmol/L (ref 98–110)
Creat: 0.88 mg/dL (ref 0.50–0.99)
Globulin: 2.7 g/dL (ref 1.9–3.7)
Glucose, Bld: 87 mg/dL (ref 65–99)
Potassium: 3.8 mmol/L (ref 3.5–5.3)
Sodium: 140 mmol/L (ref 135–146)
Total Bilirubin: 0.6 mg/dL (ref 0.2–1.2)
Total Protein: 7.3 g/dL (ref 6.1–8.1)
eGFR: 82 mL/min/{1.73_m2} (ref >=60)

## 2023-10-10 LAB — TSH: TSH: 1.3 m[IU]/L

## 2023-10-11 ENCOUNTER — Other Ambulatory Visit: Payer: Self-pay | Admitting: Physician Assistant

## 2023-10-12 ENCOUNTER — Ambulatory Visit: Admitting: Psychiatry

## 2023-10-12 DIAGNOSIS — F329 Major depressive disorder, single episode, unspecified: Secondary | ICD-10-CM

## 2023-10-12 NOTE — Progress Notes (Signed)
 [facility] Counselor/Therapist Progress Note  Patient ID: Yolanda Wolfe, MRN: [id number],    Date: 10/12/2023  Time Spent: 50 minutes  Treatment Type: Individual Therapy  Reported Symptoms: anxiety, depression, insomnia improving, motivation improving some in getting daily activities some, work tasks getting done more    Mental Status Exam:  Appearance:   Casual and Neat     Behavior:  Appropriate, Sharing, and Motivated  Motor:  Normal  Speech/Language:   Normal Rate  Affect:  Depressed and anxious  Mood:  anxious and depressed  Thought process:  goal directed  Thought content:    WNL  Sensory/Perceptual disturbances:    WNL  Orientation:  oriented to person, place, time/date, situation, day of week, month of year, year, and stated date of October 12, 2023  Attention:  Good  Concentration:  Good  Memory:  WNL  Fund of knowledge:   Good  Insight:    Good  Judgment:   Good  Impulse Control:  Good   Risk Assessment: Danger to Self:  No Self-injurious Behavior: No Danger to Others: No Duty to Warn:no Physical Aggression / Violence:No  Access to Firearms a concern: No  Gang Involvement:No   Subjective:   Patient in session today reporting symptoms of anxiety, depression, some insomnia, no SI, and motivation improving. Recent trip abroad went pretty well, although tiring. Shares her love of riding horses over the years and bought a horse a couple yrs ago but may re-home him with a therapeutic riding group, and she is hoping this will work out and would reduce patient's stress. Notices her motivation is improving, "a little more peace with who I was and am and feel some improvement. Improvement noted includes less dread in going to work, some less anxiety and nervousness. Feels work is "do-able". Some improved energy which is helpful. Feels she is managing better at work and does better with a routine and schedule. 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 Has been working some on making summer plans including art classes, vacation time, and being with others (which she tends to avoid at times). Sleep is some better. Now that she is feeling some better, she has been more in contact with others. More positive about herself and situation. Good support system of family and coworkers. Feels marital communication is healthier more recently. Looking less depressed with less flat affect. Reports little thing that would normally frustrate her, are not "messing me up as much." Does notice that some things she used to enjoy, she has lost some interest in and wants to regain some interest and enjoyment in those areas. An example she gives is that she's like to get back into art more, and plans to find one of her art books and practice some drawing of painting.  Working on some balancing of activities and time for the summer to include some structure time and some unstructured which she thinks both will be good.  More open today.  Smiling more often and engaging in her talking more readily.  Noticeably less depressed.  Continues to want to feel better and happier with her being more positive things in her life, and sees today that this can happen for her as she keeps working on her goals and believing more in herself.  Will continue to use journaling as a tool between sessions.  Interventions: Cognitive Behavioral Therapy and Ego-Supportive Identify life  conflicts and/or situations including from her past and in the present, that support her current symptomology of anxiety and depression. Develop behavioral and cognitive strategies to reduce or eliminate excessive anxiety and depression.  Identify, challenge, and replace negative self talk with more positive, realistic, and empowering self talk. Patient will develop healthier self talk as a means of feeling better about herself and better managing her anxiety and  depression. Patient to increase daily social experiences and work on strengthening a new non-avoidant approach and build self-confidence. Patient to work on developing reality based, positive cognitive messages.   Diagnosis:   ICD-10-CM   1. Treatment-resistant depression  F32.9      Plan:    Patient today working further in her session on trying to "improve mood", decrease anxiety and depression and continues to work on these goals and has noticed some progress in her mood and outlook.  By having some positive response already, she feels that is helping motivate her to keep working on her goals and believe more in herself and her ability to feel better.  Goal review and progress/challenges noted with patient.  Next appointment within 2 weeks.   Kelleen Patee, LCSW

## 2023-10-17 ENCOUNTER — Other Ambulatory Visit: Payer: Self-pay | Admitting: Obstetrics and Gynecology

## 2023-10-17 ENCOUNTER — Ambulatory Visit
Admission: RE | Admit: 2023-10-17 | Discharge: 2023-10-17 | Disposition: A | Discharge status: Home / Self Care | Source: Ambulatory Visit | Attending: Obstetrics and Gynecology | Admitting: Obstetrics and Gynecology

## 2023-10-17 DIAGNOSIS — Z0000 Encounter for general adult medical examination without abnormal findings: Secondary | ICD-10-CM

## 2023-10-18 ENCOUNTER — Other Ambulatory Visit (HOSPITAL_COMMUNITY): Payer: Self-pay

## 2023-10-18 ENCOUNTER — Encounter (HOSPITAL_COMMUNITY): Payer: Self-pay

## 2023-10-20 ENCOUNTER — Ambulatory Visit (INDEPENDENT_AMBULATORY_CARE_PROVIDER_SITE_OTHER): Admitting: Physician Assistant

## 2023-10-20 ENCOUNTER — Other Ambulatory Visit: Payer: Self-pay

## 2023-10-20 ENCOUNTER — Encounter: Payer: Self-pay | Admitting: Physician Assistant

## 2023-10-20 DIAGNOSIS — F99 Mental disorder, not otherwise specified: Secondary | ICD-10-CM | POA: Diagnosis not present

## 2023-10-20 DIAGNOSIS — F5105 Insomnia due to other mental disorder: Secondary | ICD-10-CM | POA: Diagnosis not present

## 2023-10-20 DIAGNOSIS — F411 Generalized anxiety disorder: Secondary | ICD-10-CM | POA: Diagnosis not present

## 2023-10-20 DIAGNOSIS — F329 Major depressive disorder, single episode, unspecified: Secondary | ICD-10-CM

## 2023-10-20 MED ORDER — DULOXETINE HCL 60 MG PO CPEP: 120.0000 mg | ORAL_CAPSULE | Freq: Every day | ORAL | 1 refills | Status: DC

## 2023-10-20 MED ORDER — ALPRAZOLAM 1 MG PO TABS: 2.0000 mg | ORAL_TABLET | Freq: Every day | ORAL | 5 refills | Status: DC

## 2023-10-20 MED ORDER — ESZOPICLONE 3 MG PO TABS: 3.0000 mg | ORAL_TABLET | Freq: Every evening | ORAL | 1 refills | Status: DC | PRN

## 2023-10-20 MED ORDER — LURASIDONE HCL 80 MG PO TABS
80.0000 mg | ORAL_TABLET | Freq: Every day | ORAL | 1 refills | Status: DC
Start: 2023-10-20 — End: 2024-01-24

## 2023-10-20 NOTE — Progress Notes (Signed)
 [facility] Med Check  Patient ID: Yolanda Wolfe,  MRN: [id number]  PCP: [place], Betty G, MD  Date of Evaluation: 10/20/2023 Time spent:25 minutes  Chief Complaint:  Chief Complaint   Depression    HISTORY/CURRENT STATUS: HPI  For routine med check.   [place]igh is doing [facility] better!  That she is about 80% better than she was at the last visit.  Counseling is going really well.  She may be selling her horse, she loves riding but the way she has felt in the past year or so has made having her horse more of a chore than a pleasure.  She is more able to enjoy things.  Energy and motivation are good most days.  Appetite is normal and weight is stable although she is losing on purpose.  She does not cry easily.  No feelings of hopelessness.  ADLs and personal hygiene are normal.  She denies SI/HI.   Her biggest problem is still insomnia.  She has trouble both falling asleep and staying asleep.  She has tried numerous medications, some work for a little while and then quit or they do not work at all.  She practices good hygiene already.  She will be seeing pulmonology for the insomnia sometime next month.  Anxiety is controlled.  Patient denies increased energy with decreased need for sleep, increased talkativeness, racing thoughts, impulsivity or risky behaviors, increased spending, increased libido, grandiosity, increased irritability or anger, paranoia, or hallucinations.  Denies dizziness, syncope, seizures, numbness, tingling, tremor, tics, unsteady gait, slurred speech, confusion. Denies muscle or joint pain, stiffness, or dystonia. Denies unexplained weight loss, frequent infections, or sores that heal slowly.  No polyphagia, polydipsia, or polyuria. Denies visual changes or paresthesias.   Individual Medical History/ Review of Systems: Changes? :No     Past medications for mental health diagnoses include: Amitriptylline for sleep? Celexa,  Zoloft , Cymbalta , Wellbutrin  caused  migraines to worsen,  Lunesta , Ativan , Belsomra , Temazapam,  trazodone  not effective at low doses but at 100 mg she was too groggy, Mirtazapine  caused extreme wt gain, Sonata  ineffective, Ambien   Topamax  for migraines, somewhat effective  Allergies: Wellbutrin  [bupropion ]  Current Medications:  Current Outpatient Medications:    acetaZOLAMIDE  (DIAMOX ) 250 MG tablet, TAKE 1 TABLET BY MOUTH TWICE A DAY, Disp: 180 tablet, Rfl: 1   Atogepant  (QULIPTA ) 60 MG TABS, Take 1 tablet (60 mg total) by mouth daily., Disp: 30 tablet, Rfl: 11   botulinum toxin Type A  (BOTOX ) 200 units injection, Inject 155 units IM into multiple site in the face,neck and head once every 90 days, Disp: 1 each, Rfl: 4   Eszopiclone  3 MG TABS, Take 1 tablet (3 mg total) by mouth at bedtime as needed., Disp: 30 tablet, Rfl: 1   Methylfol-Algae-B12-Acetylcyst (CEREFOLIN NAC) 6-90.314-2-600 MG TABS, Take 1 tablet by mouth daily., Disp: 30 tablet, Rfl: 11   prazosin  (MINIPRESS ) 2 MG capsule, TAKE 1 CAPSULE BY MOUTH AT BEDTIME., Disp: 90 capsule, Rfl: 0   Rimegepant Sulfate (NURTEC) 75 MG TBDP, Take 1 tablet (75 mg total) by mouth daily as needed., Disp: 16 tablet, Rfl: 5   Tirzepatide (MOUNJARO [place]), Inject into the skin., Disp: , Rfl:    tretinoin (RETIN-A) 0.1 % cream, , Disp: , Rfl:    ALPRAZolam  (XANAX ) 1 MG tablet, Take 2 tablets (2 mg total) by mouth at bedtime., Disp: 60 tablet, Rfl: 5   DULoxetine  (CYMBALTA ) 60 MG capsule, Take 2 capsules (120 mg total) by mouth daily., Disp: 180 capsule, Rfl: 1  lurasidone  (LATUDA ) 80 MG TABS tablet, Take 1 tablet (80 mg total) by mouth daily with breakfast., Disp: 90 tablet, Rfl: 1 Medication Side Effects:  H/A w/ Wellbutrin , Sexual dysfunction with Cymbalta     Family Medical/ Social History: Changes? no  MENTAL HEALTH EXAM:  There were no vitals taken for this visit.There is no height or weight on file to calculate BMI.  General Appearance: Casual, Well Groomed, and Obese  Eye  Contact:  Good  Speech:  Clear and Coherent and Normal Rate  Volume:  Normal  Mood:  Euthymic  Affect:  Congruent  Thought Process:  Goal Directed and Descriptions of Associations: Circumstantial  Orientation:  Full (Time, Place, and Person)  Thought Content: Logical   Suicidal Thoughts:  No  Homicidal Thoughts:  No  Memory:  WNL  Judgement:  Good  Insight:  Good  Psychomotor Activity:  Normal  Concentration:  Concentration: Good and Attention Span: Good  Recall:  Good  Fund of Knowledge: Good  Language: Good  Assets:  Communication Skills Desire for Improvement Financial Resources/Insurance Housing Resilience Transportation Vocational/Educational  ADL's:  Intact  Cognition: WNL  Prognosis:  Good   Dr. [place] is following labs.   GeneSight test results are in chart under Labs from 08/05/2020, had moderately reduced folic acid conversion  ECT-MADRS    Flowsheet Row Office Visit from 10/20/2023 in [facility] Office Visit from 08/31/2023 in [facility]  MADRS Total Score 17 38      GAD-7    Flowsheet Row Office Visit from 07/25/2022 in [facility] [place] Office Visit from 07/31/2020 in [facility]  Total GAD-7 Score 5 13      PHQ2-9    Flowsheet Row Office Visit from 07/25/2022 in [facility] [place] Office Visit from 03/30/2022 in [facility] [place] Video Visit from 02/25/2022 in [facility] [place] Office Visit from 07/31/2020 in [facility]  PHQ-2 Total Score 2 6 2 1   PHQ-9 Total Score 10 23 10 9        DIAGNOSES:    ICD-10-CM   1. Treatment-resistant depression  F32.9     2. Generalized anxiety disorder  F41.1     3. Insomnia due to other mental disorder  F51.05    F99      Receiving Psychotherapy: Yes with Reid Capuchin, LCSW  RECOMMENDATIONS:   PDMP was reviewed.  Xanax  filled 09/19/2023.  Ambien  filled 08/31/2023. I provided 20 minutes of face to face time during this encounter, including time spent before and after the visit in records review, medical decision making, counseling pertinent to today's visit, and charting.   I am glad to see her doing so much better!    We discussed the insomnia.  I am glad she will be seeing a pulmonologist soon to evaluate the insomnia.  In the meantime I recommend retrying Lunesta .  Has been a very long time since she has been on that and she does not remember what happened.  No other changes will be made.  Continue Xanax  1 mg, 2 p.o. nightly as needed. Continue Cymbalta   60 mg, 2 every day.  Continue Cerafolin NAC, 1 daily. Start Lunesta  3 mg, 1 p.o. nightly. Continue  Latuda   80 mg, 1 p.o. q. evening with food. Continue prazosin  2 mg, 1 p.o. nightly.   Continue therapy with Reid Capuchin, LCSW. Return in 2 months.  Marvia Slocumb, PA-C

## 2023-10-23 ENCOUNTER — Other Ambulatory Visit (HOSPITAL_COMMUNITY): Payer: Self-pay

## 2023-10-23 ENCOUNTER — Other Ambulatory Visit: Payer: Self-pay

## 2023-10-23 ENCOUNTER — Ambulatory Visit (INDEPENDENT_AMBULATORY_CARE_PROVIDER_SITE_OTHER): Admitting: Psychiatry

## 2023-10-23 DIAGNOSIS — F411 Generalized anxiety disorder: Secondary | ICD-10-CM

## 2023-10-23 NOTE — Progress Notes (Signed)
 [facility] Counselor/Therapist Progress Note  Patient ID: Yolanda Wolfe, MRN: [id number],    Date: 10/23/2023  Time Spent: 53 minutes   Treatment Type: Individual Therapy  Reported Symptoms: anxiety, depression, insomnia improving, motivation improving in daily activities "a little", work task completed more at work but wants to "stay at home after work"   Mental Status Exam:  Appearance:   Casual and Neat     Behavior:  Appropriate, Sharing, and Motivated  Motor:  Normal  Speech/Language:   Clear and Coherent  Affect:  Depressed and anxious  Mood:  anxious and depressed  Thought process:  goal directed  Thought content:    WNL  Sensory/Perceptual disturbances:    WNL  Orientation:  oriented to person, place, time/date, situation, day of week, month of year, year, and stated date of Oct 23, 2023  Attention:  Good  Concentration:  Good  Memory:  WNL  Fund of knowledge:   Good  Insight:    Good  Judgment:   Good  Impulse Control:  Good   Risk Assessment: Danger to Self:  No Self-injurious Behavior: No Danger to Others: No Duty to Warn:no Physical Aggression / Violence:No  Access to Firearms a concern: No  Gang Involvement:No   Subjective:   Patient today showing good motivation and participation in session working further on her anxiety, some insomnia, depression, and motivation which does seem improved today. Noticing improvement in not dreading going in to work (used to have Major anxiety and fear but getting better now), sleep some better, sleep some better, talking to my mom and was able to open up to mom which she and I both appreciate. Feeling more peace within herself. Saw OB-GYN and states she may be going into peri-menopause. Is worried about not having any libido currently as me and my husband have a great relationship but not in terms of our sex life, which I do sometime feel badly about but felt better after talking with her OB-GYN Dr. Serge Dancer.   "Re-homed her horse on trial basis" and may be more permanent if all works out ok. ("This will reduce my stress.")  Thinking about activities for the summer. Some relationship issues with husband especially in more personal ways (not all details included in this note due to patient privacy needs.). Other than in some of the "personal/private" issues shared, patient states the anxiety and nervousness are some better. Definitely managing better at work and again a routine is helpful. Got a positive review! Less dread in going to work. Continues to work on summer plans and activities as it's better for her to remain involved with people and activities. Daughter (16) is also doing better with her issues. Good family support. Much more positive re: herself and her improvement. Family and coworkers supportive. Communication within marriage healthier and more open. Patient looks less depressed in her affect and in her talking although knows she "has a way to go to keep getting better." Definitely more encouraged. More smiling today. A little more interest in taking up some interests in art, drawing/painting, other activities. Less depressed noticeably. Motivation improving and encouraged patient to continue some journaling between sessions.  Interventions: Cognitive Behavioral Therapy and Ego-Supportive Identify life conflicts and/or situations including from her past and in the present, that support her current symptomology of anxiety and depression. Develop behavioral and cognitive strategies to reduce or eliminate excessive anxiety and depression.  Identify, challenge, and replace negative self talk with more positive, realistic, and  empowering self talk. Patient will develop healthier self talk as a means of feeling better about herself and better managing her anxiety and depression. Patient to increase daily social experiences and work on strengthening a new non-avoidant approach and build  self-confidence. Patient to work on developing reality based, positive cognitive messages.    Diagnosis:   ICD-10-CM   1. Generalized anxiety disorder  F41.1      Plan: Patient today in session with noticeably improvement in her mood and decrease in her anxiety and depression.  She is continuing to work in each of these areas to make more progress.  Outlook has improved.  Does feel that the improvement she is feeling now is helping motivate her to continue believing more in herself, to keep working on her goals, and to feel encouraged about herself in the progress she has made thus far.  Continues with her goal-directed work and also taking medication as prescribed with benefit.  Goal review and progress/challenges noted with patient.  Next appointment within 2 weeks.   Kelleen Patee, LCSW

## 2023-10-23 NOTE — Progress Notes (Signed)
 Specialty Pharmacy Refill Coordination Note  Yolanda Wolfe is a 48 y.o. female contacted today regarding refills of specialty medication(s) OnabotulinumtoxinA  (Botox )   Patient requested Courier to Provider Office   Delivery date: 10/25/23   Verified address: [facility]    [address]   [place] [address]   Medication will be filled on 10/24/23.

## 2023-10-30 ENCOUNTER — Encounter: Payer: Self-pay | Admitting: Family Medicine

## 2023-10-30 ENCOUNTER — Telehealth: Payer: Self-pay

## 2023-10-30 ENCOUNTER — Ambulatory Visit (INDEPENDENT_AMBULATORY_CARE_PROVIDER_SITE_OTHER): Admitting: Family Medicine

## 2023-10-30 VITALS — BP 120/70 | HR 64 | Temp 97.8°F | Resp 12 | Ht 64.0 in | Wt 172.4 lb

## 2023-10-30 DIAGNOSIS — N390 Urinary tract infection, site not specified: Secondary | ICD-10-CM | POA: Diagnosis not present

## 2023-10-30 DIAGNOSIS — R300 Dysuria: Secondary | ICD-10-CM | POA: Diagnosis not present

## 2023-10-30 LAB — POC URINALSYSI DIPSTICK (AUTOMATED)
Bilirubin, UA: POSITIVE
Blood, UA: POSITIVE
Glucose, UA: NEGATIVE
Ketones, UA: NEGATIVE
Nitrite, UA: POSITIVE
Protein, UA: POSITIVE — AB
Spec Grav, UA: 1.025 (ref 1.010–1.025)
Urobilinogen, UA: 2 U/dL — AB
pH, UA: 5.5 (ref 5.0–8.0)

## 2023-10-30 MED ORDER — NITROFURANTOIN MONOHYD MACRO 100 MG PO CAPS: 100.0000 mg | ORAL_CAPSULE | Freq: Two times a day (BID) | ORAL | 0 refills | Status: AC

## 2023-10-30 NOTE — Telephone Encounter (Signed)
 Spoke with pt, advised visit would be needed. Appt scheduled for 1:30 this afternoon.

## 2023-10-30 NOTE — Patient Instructions (Addendum)
 A few things to remember from today's visit:  Dysuria - Plan: POCT Urinalysis Dipstick (Automated), Culture, Urine  Urinary tract infection without hematuria, site unspecified - Plan: nitrofurantoin , macrocrystal-monohydrate, (MACROBID ) 100 MG capsule  Adequate fluid intake, avoid holding urine for long hours, and over the counter Vit C OR cranberry capsules might help.  Today we will treat empirically with antibiotic, which we might need to change when urine culture comes back depending of bacteria susceptibility.  Seek immediate medical attention if severe abdominal pain, vomiting, fever/chills, or worsening symptoms. F/U if symptomatic are not any better after 2-3 days of antibiotic treatment.  If you need refills for medications you take chronically, please call your pharmacy. Do not use My Chart to request refills or for acute issues that need immediate attention. If you send a my chart message, it may take a few days to be addressed, specially if I am not in the office.  Please be sure medication list is accurate. If a new problem present, please set up appointment sooner than planned today.

## 2023-10-30 NOTE — Telephone Encounter (Signed)
 Copied from CRM [id number]. Topic: Clinical - Medical Advice >> Oct 30, 2023 10:08 AM Ivette P wrote: Reason for CRM: PT called in because is currently having UTI and would like a prescription medication called in for the UTI. Pt would like to know if possible  Pt Callback [phone]

## 2023-10-30 NOTE — Progress Notes (Signed)
 ACUTE VISIT Chief Complaint  Patient presents with   Urinary Tract Infection    Started yesterday or Saturday, dysuria    HPI: [name] is a 48 y.o. female, who is here today complaining of dysuria, burning sensation, and urgency, starting 1-2 days ago.   She reports the burning sensation persists after urination.   Urinary Tract Infection  This is a new problem. The current episode started yesterday. The problem occurs every urination. The problem has been gradually worsening. The quality of the pain is described as burning. The pain is moderate. There has been no fever. She is Sexually active. There is No history of pyelonephritis. Associated symptoms include frequency, hesitancy and urgency. Pertinent negatives include no chills, discharge, flank pain, hematuria, nausea, possible pregnancy, sweats or vomiting. She has tried nothing for the symptoms. There is no history of recurrent UTIs.   She has not tried any OTC medications.  No known allergies to antibiotics.  Denies any unusual vaginal discharge,pruritus, vaginal bleeding or gross hematuria.  Negative for unusual back pain or falls.  Of note, pt has had an abdominal hysterectomy in 2009.   Review of Systems  Constitutional:  Negative for chills and malaise/fatigue.  Respiratory:  Negative for cough and shortness of breath.   Cardiovascular:  Negative for chest pain and leg swelling.  Gastrointestinal:  Negative for nausea and vomiting.  Genitourinary:  Positive for frequency, hesitancy and urgency. Negative for flank pain and hematuria.  Skin:  Negative for rash.  Neurological:  Negative for loss of consciousness and weakness.  See other pertinent positives and negatives in HPI.  Current Outpatient Medications on File Prior to Visit  Medication Sig Dispense Refill   acetaZOLAMIDE  (DIAMOX ) 250 MG tablet TAKE 1 TABLET BY MOUTH TWICE A DAY 180 tablet 1   ALPRAZolam  (XANAX ) 1 MG tablet Take 2 tablets (2 mg  total) by mouth at bedtime. 60 tablet 5   Atogepant  (QULIPTA ) 60 MG TABS Take 1 tablet (60 mg total) by mouth daily. 30 tablet 11   botulinum toxin Type A  (BOTOX ) 200 units injection Inject 155 units IM into multiple site in the face,neck and head once every 90 days 1 each 4   DULoxetine  (CYMBALTA ) 60 MG capsule Take 2 capsules (120 mg total) by mouth daily. 180 capsule 1   Eszopiclone  3 MG TABS Take 1 tablet (3 mg total) by mouth at bedtime as needed. 30 tablet 1   lurasidone  (LATUDA ) 80 MG TABS tablet Take 1 tablet (80 mg total) by mouth daily with breakfast. 90 tablet 1   Methylfol-Algae-B12-Acetylcyst (CEREFOLIN NAC) 6-90.314-2-600 MG TABS Take 1 tablet by mouth daily. 30 tablet 11   prazosin  (MINIPRESS ) 2 MG capsule TAKE 1 CAPSULE BY MOUTH AT BEDTIME. 90 capsule 0   Rimegepant Sulfate (NURTEC) 75 MG TBDP Take 1 tablet (75 mg total) by mouth daily as needed. 16 tablet 5   Tirzepatide (MOUNJARO [place]) Inject into the skin.     tretinoin (RETIN-A) 0.1 % cream      No current facility-administered medications on file prior to visit.    Past Medical History:  Diagnosis Date   Anxiety    Blood transfusion without reported diagnosis    Depression    Frequent headaches    GERD (gastroesophageal reflux disease)    Heart murmur    Migraine    UTI (lower urinary tract infection)    Allergies  Allergen Reactions   Wellbutrin  [Bupropion ] Other (See Comments)    Worsened migraines  Social History   Socioeconomic History   Marital status: Married    Spouse name: Not on file   Number of children: 1   Years of education: Not on file   Highest education level: Doctorate  Occupational History    Comment: college counselor and admissions counselor  Tobacco Use   Smoking status: Never   Smokeless tobacco: Never  Vaping Use   Vaping status: Never Used  Substance and Sexual Activity   Alcohol use: Yes    Alcohol/week: 1.0 standard drink of alcohol    Types: 1 Glasses of wine per week     Comment: maybe 1-2 monthly   Drug use: No   Sexual activity: Yes    Partners: Male    Birth control/protection: Surgical  Other Topics Concern   Not on file  Social History Narrative   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    Remarried last year, husband is Presenter, broadcasting.   She and ex husband coparent their almost 71 yo daughter.   She grew up in [place], went to [employer], married 1st husband, traumatic birth see HPI.       Caffeine-no more than 1 per day, and not over 4 days a week.    Legal-bancruptcy when she got divorced.    Religion-Quaker.   Right handed   Social Drivers of Health   Financial Resource Strain: Low Risk  (04/26/2023)   Overall Financial Resource Strain (CARDIA)    Difficulty of Paying Living Expenses: Not hard at all  Food Insecurity: No Food Insecurity (04/26/2023)   Hunger Vital Sign    Worried About Running Out of Food in the Last Year: Never true    Ran Out of Food in the Last Year: Never true  Transportation Needs: No Transportation Needs (04/26/2023)   PRAPARE - Administrator, Civil Service (Medical): No    Lack of Transportation (Non-Medical): No  Physical Activity: Insufficiently Active (04/26/2023)   Exercise Vital Sign    Days of Exercise per Week: 2 days    Minutes of Exercise per Session: 20 min  Stress: Stress Concern Present (04/26/2023)   [employer] of Occupational Health - Occupational Stress Questionnaire    Feeling of Stress : Rather much  Social Connections: Moderately Isolated (04/26/2023)   Social Connection and Isolation Panel [NHANES]    Frequency of Communication with Friends and Family: More than three times a week    Frequency of Social Gatherings with Friends and Family: Twice a week    Attends Religious Services: Never    Database administrator or Organizations: No    Attends Engineer, structural: Not on file    Marital  Status: Married   Vitals:   10/30/23 1326  BP: 120/70  Pulse: 64  Resp: 12  Temp: 97.8 F (36.6 C)  SpO2: 98%   Body mass index is 29.59 kg/m.  Physical Exam Vitals and nursing note reviewed.  Constitutional:      General: She is not in acute distress.    Appearance: She is well-developed.  HENT:     Head: Normocephalic and atraumatic.  Eyes:     Conjunctiva/sclera: Conjunctivae normal.  Cardiovascular:     Rate and Rhythm: Normal rate and regular rhythm.  Pulmonary:     Effort: Pulmonary effort is normal. No respiratory distress.     Breath sounds: Normal breath sounds.  Abdominal:  Palpations: Abdomen is soft. There is no mass.     Tenderness: There is no abdominal tenderness. There is no right CVA tenderness or left CVA tenderness.  Musculoskeletal:     Right lower leg: No edema.     Left lower leg: No edema.  Skin:    General: Skin is warm.     Findings: No erythema.  Neurological:     General: No focal deficit present.     Mental Status: She is alert and oriented to person, place, and time.     Gait: Gait normal.  Psychiatric:        Mood and Affect: Mood and affect normal.   ASSESSMENT AND PLAN: Ms. Lucindia Lathe was seen today for dysuria.   Dysuria Possible etiology discussed. Symptoms and urine dipstick results suggestive of UTI. Will follow urine culture.  -     POCT Urinalysis Dipstick (Automated) -     Urine Culture; Future  Urinary tract infection without hematuria, site unspecified Urine dipstick positive for blood, protein, nitrite, and 3+ leukocytes. Empiric treatment with Macrobid  100 mg twice daily for 5 days started today, we will tailor treatment according to urine culture result and antibiotic sensitivity. Monitor for new symptoms. Increase water intake. Instructed about warning signs.  -     Nitrofurantoin  Monohyd Macro; Take 1 capsule (100 mg total) by mouth 2 (two) times daily for 5 days.  Dispense: 10 capsule;  Refill: 0  Return if symptoms worsen or fail to improve, for keep next appointment.  I, Bernita Bristle, acting as a scribe for Emmarose Klinke [place], MD., have documented all relevant documentation on the behalf of Gradie Ohm [place], MD, as directed by   while in the presence of Marquiz Sotelo [place], MD.  I, Elio Haden [place], MD, have reviewed all documentation for this visit. The documentation on 10/30/23 for the exam, diagnosis, procedures, and orders are all accurate and complete.  Carmel Garfield G. [place], MD  [facility]. [place] office.

## 2023-11-01 ENCOUNTER — Ambulatory Visit: Payer: Self-pay | Admitting: Family Medicine

## 2023-11-01 LAB — URINE CULTURE
MICRO NUMBER:: 16443315
SPECIMEN QUALITY:: ADEQUATE

## 2023-11-03 ENCOUNTER — Ambulatory Visit: Payer: Self-pay | Admitting: Neurology

## 2023-11-03 DIAGNOSIS — G43709 Chronic migraine without aura, not intractable, without status migrainosus: Secondary | ICD-10-CM | POA: Diagnosis not present

## 2023-11-03 MED ORDER — ONABOTULINUMTOXINA 100 UNITS IJ SOLR
200.0000 [IU] | Freq: Once | INTRAMUSCULAR | Status: AC
Start: 2023-11-03 — End: 2023-11-03
  Administered 2023-11-03: 155 [IU] via INTRAMUSCULAR

## 2023-11-03 NOTE — Progress Notes (Signed)

## 2023-11-08 ENCOUNTER — Ambulatory Visit: Admitting: Psychiatry

## 2023-11-08 DIAGNOSIS — F411 Generalized anxiety disorder: Secondary | ICD-10-CM | POA: Diagnosis not present

## 2023-11-08 NOTE — Progress Notes (Signed)
 [facility] Counselor/Therapist Progress Note  Patient ID: Yolanda Wolfe, MRN: [id number],    Date: 11/08/2023  Time Spent: 58 minutes   Treatment Type: Individual Therapy  Reported Symptoms: anxiety, depression decreased some, insomnia, motivation some improvement in daily activities, "I' off from school and out for the summer" and can be difficult staying connected,    Mental Status Exam:  Appearance:   Casual and Neat     Behavior:  Appropriate, Sharing, and Motivated  Motor:  Normal  Speech/Language:   Clear and Coherent  Affect:  Depressed and anxiety  Mood:  anxious and depressed  Thought process:  goal directed  Thought content:    Obsessive thoughts  Sensory/Perceptual disturbances:    WNL  Orientation:  oriented to person, place, time/date, situation, day of week, month of year, year, and stated date of Nov 08, 2023  Attention:  Good  Concentration:  Good  Memory:  WNL  Fund of knowledge:   Good  Insight:    Good  Judgment:   Good  Impulse Control:  Good   Risk Assessment: Danger to Self:  No Self-injurious Behavior: No Danger to Others: No Duty to Warn:no Physical Aggression / Violence:No  Access to Firearms a concern: No  Gang Involvement:No   Subjective:   Patient in session today motivated and working further on her anxiety, insomnia, depression, and motivation. Is dealing with a lot of personal stress, some recent sleep issues, and "shutting me down" emotionally and stuck in my head with my thoughts." Concerned for school situation with daughter and processed this at length in session today as patient is having a very difficult time with circumstances and needed to talk through the situation and her thoughts and feelings.  Her daughter's father is involved also and will be helping with the contact between school/parents/their daughter.  Encouraged patient to be sure and remain on her prescribed medication specifically to help with the insomnia,  depression, and anxiety and she agrees and supports this.  Patient is off work for the summer now and has more time with her daughter and also for more positive self-care.  Does plan to have some activities going on so as to remain involved with people and some time to spend more with her daughter, age 46.  Family support remains positive.  Patient has also felt more positive with some of her improvement however the school situation has really concerned her.  She does feel that the support and communication within her marriage remains healthier and more open and accepting.  Marital communication is getting healthier and moving in a positive direction.  Encouraged patient to use journaling as a tool in between sessions if she feels that might be helpful especially with some of the extra stress that she feels personally and with the pending situation with school for her daughter.  Interventions: Cognitive Behavioral Therapy and Ego-Supportive Identify life conflicts and/or situations including from her past and in the present, that support her current symptomology of anxiety and depression. Develop behavioral and cognitive strategies to reduce or eliminate excessive anxiety and depression.  Identify, challenge, and replace negative self talk with more positive, realistic, and empowering self talk. Patient will develop healthier self talk as a means of feeling better about herself and better managing her anxiety and depression. Patient to increase daily social experiences and work on strengthening a new non-avoidant approach and build self-confidence. Patient to work on developing reality based, positive cognitive messages.  Diagnosis:  ICD-10-CM   1. Generalized anxiety disorder  F41.1      Plan:    Patient in session today showing motivation and participation and needed to focus primarily about some increase in her anxiety and some decrease in mood due to a school situation that has come up regarding  her daughter.  (Not all details included in this note due to patient privacy needs).  Patient, her husband, and patient's daughters father, are all supporting patient and are hoping the situation can get worked out favorably.  Prior to the school situation, patient had been feeling more encouraged about herself and also the progress she had made.  Encouraged patient today to realize that progress is still there, despite the fact she is more anxious right now, she can hold onto that progress and maintain it for now as she continues to support her daughter and help out in that situation however she can.  Also encouraged patient to continue on her meds as prescribed by her doctor and also hold onto the progress that she has made and can continue making into the future.    Goal review and progress/challenges noted with patient.  Next appointment within 2 weeks.   Kelleen Patee, LCSW

## 2023-11-19 NOTE — Progress Notes (Signed)
 11/21/23- 48 yoF never smoker for sleep evaluation courtesy of Verneita Cooks, PA-C at [facility] with concern of Insomnia Medical problem list includes Obesity, Varicose Veins, Migraine, Anxiety/ Depression, GERD, Nephrolithiasis, Professor at [employer]- -Xanax  1mg x2 at hs, Eszopiclone  3,  Epworth score-11 Body weight today- 161 lbs Followed by [facility] with Insomnia attributed to anxiety/depression. She describes may years of struggling to fall asleep and/or stay asleep, waking up tired no matter how much I sleep. She had previous sleep eval by Dr Karlynn Ee with HST neg for OSA then. Father has OSA. Denies cataplexy or sleep paralysis.  Describes vivid dreams. Says husband not noting loud snoring or unusual movement/ activity in sleep. Now taking Xanax  2 mg and lunesta  3 mg. These may help some. Previously has tried ambien , temazepam , dalmane, belsomra , mybe doxepin and some otcs.   Prior to Admission medications   Medication Sig Start Date End Date Taking? Authorizing Provider  acetaZOLAMIDE  (DIAMOX ) 250 MG tablet TAKE 1 TABLET BY MOUTH TWICE A DAY 10/04/22  Yes Jaffe, Adam R, DO  ALPRAZolam  (XANAX ) 1 MG tablet Take 2 tablets (2 mg total) by mouth at bedtime. 10/20/23  Yes Hurst, Verneita DASEN, PA-C  Atogepant  (QULIPTA ) 60 MG TABS Take 1 tablet (60 mg total) by mouth daily. 04/10/23  Yes Skeet, Adam R, DO  botulinum toxin Type A  (BOTOX ) 200 units injection Inject 155 units IM into multiple site in the face,neck and head once every 90 days 04/19/23  Yes Jaffe, Adam R, DO  DULoxetine  (CYMBALTA ) 60 MG capsule Take 2 capsules (120 mg total) by mouth daily. 10/20/23  Yes Cooks Verneita T, PA-C  lurasidone  (LATUDA ) 80 MG TABS tablet Take 1 tablet (80 mg total) by mouth daily with breakfast. 10/20/23  Yes Hurst, Teresa T, PA-C  Methylfol-Algae-B12-Acetylcyst (CEREFOLIN NAC) 6-90.314-2-600 MG TABS Take 1 tablet by mouth daily. 07/27/23  Yes Cooks Verneita T, PA-C  prazosin  (MINIPRESS ) 2 MG capsule TAKE 1 CAPSULE  BY MOUTH AT BEDTIME. 09/27/23  Yes Hurst, Teresa T, PA-C  progesterone (PROMETRIUM) 100 MG capsule Take 100 mg by mouth daily.   Yes [provider]  Rimegepant Sulfate (NURTEC) 75 MG TBDP Take 1 tablet (75 mg total) by mouth daily as needed. 10/28/22  Yes Jaffe, Adam R, DO  Tirzepatide (MOUNJARO [place]) Inject into the skin.   Yes [provider]  tretinoin (RETIN-A) 0.1 % cream  07/10/21  Yes [provider]  Eszopiclone  3 MG TABS TAKE 1 TABLET (3 MG TOTAL) BY MOUTH AT BEDTIME AS NEEDED. 12/18/23   Cooks Verneita T, PA-C  hydrOXYzine  (ATARAX ) 25 MG tablet TAKE 1 TABLET FOR SLEEP AS NEEDED 12/17/23   Neysa Reggy BIRCH, MD   Past Medical History:  Diagnosis Date   Anxiety    Blood transfusion without reported diagnosis    Depression    Frequent headaches    GERD (gastroesophageal reflux disease)    Heart murmur    Migraine    UTI (lower urinary tract infection)    Past Surgical History:  Procedure Laterality Date   ABDOMINAL HYSTERECTOMY  2009   Emergency right after C-Section   CESAREAN SECTION     Family History  Problem Relation Age of Onset   Cancer Mother        neuroendocrine cancer   Cancer Father        skin   Healthy Daughter    Breast cancer Maternal Grandmother 35   Heart disease Maternal Grandfather    Dementia Paternal Grandmother  Alcohol abuse Paternal Grandfather    Social History   Socioeconomic History   Marital status: Married    Spouse name: Not on file   Number of children: 1   Years of education: Not on file   Highest education level: Doctorate  Occupational History    Comment: college counselor and admissions counselor  Tobacco Use   Smoking status: Never   Smokeless tobacco: Never  Vaping Use   Vaping status: Never Used  Substance and Sexual Activity   Alcohol use: Yes    Alcohol/week: 1.0 standard drink of alcohol    Types: 1 Glasses of wine per week    Comment: maybe 1-2 monthly   Drug use: No   Sexual activity: Yes     Partners: Male    Birth control/protection: Surgical  Other Topics Concern   Not on file  Social History Narrative   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    Remarried last year, husband is Presenter, broadcasting.   She and ex husband coparent their almost 49 yo daughter.   She grew up in [place], went to [employer], married 1st husband, traumatic birth see HPI.       Caffeine-no more than 1 per day, and not over 4 days a week.    Legal-bancruptcy when she got divorced.    Religion-Quaker.   Right handed   Social Drivers of Health   Financial Resource Strain: Low Risk  (04/26/2023)   Overall Financial Resource Strain (CARDIA)    Difficulty of Paying Living Expenses: Not hard at all  Food Insecurity: No Food Insecurity (04/26/2023)   Hunger Vital Sign    Worried About Running Out of Food in the Last Year: Never true    Ran Out of Food in the Last Year: Never true  Transportation Needs: No Transportation Needs (04/26/2023)   PRAPARE - Administrator, Civil Service (Medical): No    Lack of Transportation (Non-Medical): No  Physical Activity: Insufficiently Active (04/26/2023)   Exercise Vital Sign    Days of Exercise per Week: 2 days    Minutes of Exercise per Session: 20 min  Stress: Stress Concern Present (04/26/2023)   [employer] of Occupational Health - Occupational Stress Questionnaire    Feeling of Stress : Rather much  Social Connections: Moderately Isolated (04/26/2023)   Social Connection and Isolation Panel    Frequency of Communication with Friends and Family: More than three times a week    Frequency of Social Gatherings with Friends and Family: Twice a week    Attends Religious Services: Never    Database administrator or Organizations: No    Attends Engineer, structural: Not on file    Marital Status: Married  Catering manager Violence: Not At Risk (07/31/2020)    Humiliation, Afraid, Rape, and Kick questionnaire    Fear of Current or Ex-Partner: No    Emotionally Abused: No    Physically Abused: No    Sexually Abused: No   ROS-see HPI   + = positive Constitutional:    +weight loss, night sweats, fevers, chills, fatigue, lassitude. HEENT:    headaches, difficulty swallowing, +tooth/dental problems, sore throat,       sneezing, itching, +ear ache, nasal congestion, post nasal drip, snoring CV:    chest pain, orthopnea, PND, swelling in lower extremities, anasarca,  dizziness, palpitations Resp:   shortness of breath with exertion or at rest.                productive cough,   non-productive cough, coughing up of blood.              change in color of mucus.  wheezing.   Skin:    rash or lesions. GI:  +heartburn, +indigestion, abdominal pain, nausea, vomiting, diarrhea,                 change in bowel habits, +loss of appetite GU: dysuria, change in color of urine, no urgency or frequency.   flank pain. MS:   joint pain, stiffness, decreased range of motion, back pain. Neuro-     nothing unusual Psych:  change in mood or affect.  depression or +anxiety.   memory loss.  OBJ- Physical Exam General- Alert, Oriented, Affect-appropriate, Distress- none acute, medium build Skin- rash-none, lesions- none, excoriation- none Lymphadenopathy- none Head- atraumatic            Eyes- Gross vision intact, PERRLA, conjunctivae and secretions clear            Ears- Hearing, canals-normal            Nose- Clear, no-Septal dev, mucus, polyps, erosion, perforation             Throat- Mallampati III , mucosa clear , drainage- none, tonsils- atrophic, +teeth Neck- flexible , trachea midline, no stridor , thyroid  nl, carotid no bruit Chest - symmetrical excursion , unlabored           Heart/CV- RRR , no murmur , no gallop  , no rub, nl s1 s2                           - JVD- none , edema- none, stasis changes- none, varices- none            Lung- clear to P&A, wheeze- none, cough- none , dullness-none, rub- none           Chest wall-  Abd-  Br/ Gen/ Rectal- Not done, not indicated Extrem- cyanosis- none, clubbing, none, atrophy- none, strength- nl Neuro- grossly intact to observation

## 2023-11-21 ENCOUNTER — Encounter: Payer: Self-pay | Admitting: Internal Medicine

## 2023-11-21 ENCOUNTER — Ambulatory Visit (INDEPENDENT_AMBULATORY_CARE_PROVIDER_SITE_OTHER): Admitting: Internal Medicine

## 2023-11-21 VITALS — BP 112/64 | HR 88 | Temp 97.6°F | Resp 18 | Ht 63.75 in | Wt 161.0 lb

## 2023-11-21 DIAGNOSIS — F332 Major depressive disorder, recurrent severe without psychotic features: Secondary | ICD-10-CM | POA: Diagnosis not present

## 2023-11-21 DIAGNOSIS — F5104 Psychophysiologic insomnia: Secondary | ICD-10-CM

## 2023-11-21 MED ORDER — HYDROXYZINE HCL 25 MG PO TABS: ORAL_TABLET | ORAL | 1 refills | Status: DC

## 2023-11-21 NOTE — Patient Instructions (Addendum)
 Script sent for hydroxyzine  Try taking about 20 minutes before bed Xanax  1 mg Eszopiclone (Lunesta ) 3 mg Hydroxyzine 25 mg  I would be interested if we can reduce the sense of morning "slow start".  Try taking 1/2 or 1 otc caffeine tablet with breakfast. You might even experiment whether this could help with lunch time fatigue as well without carrying over into night time.  Suggest you look into Cognitive Behavioral Therapy for your pattern of psychophysiologic (performance anxiety) insomnia:   [phone]

## 2023-11-22 ENCOUNTER — Ambulatory Visit: Admitting: Psychiatry

## 2023-11-22 DIAGNOSIS — F411 Generalized anxiety disorder: Secondary | ICD-10-CM

## 2023-11-22 NOTE — Progress Notes (Signed)
 [facility] Counselor/Therapist Progress Note  Patient ID: Yolanda Wolfe, MRN: [id number],    Date: 11/22/2023  Time Spent: 53 minutes   Treatment Type: Individual Therapy  Reported Symptoms: anxiety, depression , insomnia, some motivational issues depending on what she is doing     Mental Status Exam:  Appearance:   Casual and Neat     Behavior:  Appropriate, Sharing, and Motivated  Motor:  Normal  Speech/Language:   Clear and Coherent  Affect:  Depressed and anxiety  Mood:  anxious and depressed  Thought process:  goal directed  Thought content:    WNL  Sensory/Perceptual disturbances:    WNL  Orientation:  oriented to person, place, time/date, situation, day of week, month of year, year, and stated date of November 22, 2023  Attention:  Good  Concentration:  Good  Memory:  WNL  Fund of knowledge:   Good  Insight:    Good  Judgment:   Good  Impulse Control:  Good   Risk Assessment: Danger to Self:  No Self-injurious Behavior: No Danger to Others: No Duty to Warn:no Physical Aggression / Violence:No  Access to Firearms a concern: No  Gang Involvement:No   Subjective:  Patient today motivated and continues her work on anxiety, depression, and motivation, some insomnia "but not as bad".  Saw Dr. Dianne Fortune yesterday and he addressed her insomnia with medication for her to try and then get back with doctor. May be "putting too much pressure on herself to sleep" and worked on this in session today, looking at how she has been overly focusing on this just prior to going to bed, and is now trying to decrease her heightened focus which can be feeding her anxiety. Working with former husband and father of their high school daughter trying to appeal a school-related decision that they feel would not help their daughter move forward in her education. Doing well "being out of work for several days of summer vacation and am managing it well as normally I would not be as  calm."  Patient is working well on her treatment goals and and pacing herself with certain situations, decisions, and really paying attention to her overall self-care.  Not feeling quite as "emotionally stuck in my head with my thoughts".  Is very concerned about the above-mentioned school situation with her daughter, and does feel the support of her daughter's father even though they are divorced.  Patient reports remaining on her prescribed occasions and feels that it is helping some.  Patient hoping to enjoy some of the summer months spending more time with her daughter and also for her own positive care and emotional wellbeing.  Does have some family activities planned that she is looking forward to.  Her family remains positive and supportive.  Shares that the communication within her marriage remains healthier and has become more open and accepting with both she and husband.  Particularly notes that their marital communication has continue to improve which has helped their overall relationship and adds that "we continue to work on this".  Interventions: Cognitive Behavioral Therapy and Ego-Supportive Identify life conflicts and/or situations including from her past and in the present, that support her current symptomology of anxiety and depression. Develop behavioral and cognitive strategies to reduce or eliminate excessive anxiety and depression.  Identify, challenge, and replace negative self talk with more positive, realistic, and empowering self talk. Patient will develop healthier self talk as a means of feeling better about herself  and better managing her anxiety and depression. Patient to increase daily social experiences and work on strengthening a new non-avoidant approach and build self-confidence. Patient to work on developing reality based, positive cognitive messages.   Diagnosis:   ICD-10-CM   1. Generalized anxiety disorder  F41.1      Plan:   Patient working well in session today  focusing more on personal and family situations particularly involving she has been and daughter and some school situations for her daughter.  (Not all details included in this note due to patient privacy needs).  Patient and husband have had some difficulty in their relationship recently however continue to work together trying to improve parts of their relationship, and are seeing some success in this.  Less anxious overall but still having some anxiety.  Plans to follow through on sleep hygiene that we discussed might be helpful to her in improving her sleep patterns, but also has medication if needed.  Goal review and progress/challenges noted with patient.  Next appointment within 2 weeks.   Kelleen Patee, LCSW

## 2023-11-30 ENCOUNTER — Telehealth: Payer: Self-pay | Admitting: Neurology

## 2023-11-30 NOTE — Telephone Encounter (Signed)
 I reviewed her ophthalmologist's note (Dr. Carloyn Chi).  Eyes look good.  I would like for her to discontinue the acetazolamide  and follow up with Dr. Carloyn Chi for her recommended 6 month visit.

## 2023-11-30 NOTE — Telephone Encounter (Signed)
 LMOVM for patient. Mychart message sent as well.

## 2023-12-14 ENCOUNTER — Ambulatory Visit (INDEPENDENT_AMBULATORY_CARE_PROVIDER_SITE_OTHER): Admitting: Psychiatry

## 2023-12-17 ENCOUNTER — Other Ambulatory Visit: Payer: Self-pay | Admitting: Physician Assistant

## 2023-12-17 ENCOUNTER — Other Ambulatory Visit: Payer: Self-pay | Admitting: Internal Medicine

## 2023-12-22 ENCOUNTER — Other Ambulatory Visit: Payer: Self-pay | Admitting: Physician Assistant

## 2023-12-24 NOTE — Assessment & Plan Note (Signed)
 Needs tyo continuee working closely with her behavioral health support.

## 2023-12-24 NOTE — Assessment & Plan Note (Signed)
 At least partly performance anxiety/ psychophysiologic insomnia. I discussed and recommended she explore cognitive behavioral therapy  As a medication alternative. Meanwhile we will try to shift from xanax  toward hydroxyzine .

## 2023-12-25 ENCOUNTER — Other Ambulatory Visit: Payer: Self-pay | Admitting: Neurology

## 2023-12-25 ENCOUNTER — Ambulatory Visit (INDEPENDENT_AMBULATORY_CARE_PROVIDER_SITE_OTHER): Admitting: Physician Assistant

## 2023-12-25 ENCOUNTER — Encounter: Payer: Self-pay | Admitting: Physician Assistant

## 2023-12-25 DIAGNOSIS — F515 Nightmare disorder: Secondary | ICD-10-CM

## 2023-12-25 DIAGNOSIS — F411 Generalized anxiety disorder: Secondary | ICD-10-CM

## 2023-12-25 DIAGNOSIS — G43709 Chronic migraine without aura, not intractable, without status migrainosus: Secondary | ICD-10-CM

## 2023-12-25 DIAGNOSIS — E7212 Methylenetetrahydrofolate reductase deficiency: Secondary | ICD-10-CM | POA: Diagnosis not present

## 2023-12-25 DIAGNOSIS — F5105 Insomnia due to other mental disorder: Secondary | ICD-10-CM | POA: Diagnosis not present

## 2023-12-25 DIAGNOSIS — F3341 Major depressive disorder, recurrent, in partial remission: Secondary | ICD-10-CM | POA: Diagnosis not present

## 2023-12-25 DIAGNOSIS — F99 Mental disorder, not otherwise specified: Secondary | ICD-10-CM

## 2023-12-25 MED ORDER — ESZOPICLONE 3 MG PO TABS: 3.0000 mg | ORAL_TABLET | Freq: Every evening | ORAL | 5 refills | Status: DC | PRN

## 2023-12-25 MED ORDER — PRAZOSIN HCL 2 MG PO CAPS: 2.0000 mg | ORAL_CAPSULE | Freq: Every day | ORAL | 1 refills | Status: DC

## 2023-12-25 NOTE — Progress Notes (Signed)
 [facility] Med Check  Patient ID: Yolanda Wolfe,  MRN: [id number]  PCP: [place], Betty G, MD  Date of Evaluation: 12/25/2023 Time spent:20 minutes  Chief Complaint:  Chief Complaint   Anxiety; Depression; Insomnia; Follow-up    HISTORY/CURRENT STATUS: HPI  For routine med check.   [name] is doing well. Enjoying being out of school for the summer. 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  Other than that, anxiety is controlled. Takes Xanax  prn.    Meds are working well.  Energy and motivation are good.   No extreme sadness, tearfulness, or feelings of hopelessness.  Sleep is still an issue.  Nightmares are better with the prazosin .  She does have vivid dreams though that can sometimes be disturbing.  Saw Dr. Neysa recently for evaluation, he gave her Hydroxyzine .  States she can't tell a difference in taking that or not. Sleep study not ordered at this time.  She follows up with him in 3 months.  ADLs and personal hygiene are normal.   No change in memory reported.  On Monjouro and is slowly losing weight. It's working well.  No mania, psychosis, delirium, or SI/HI.  Having more migraines over the past couple of months.  Somewhat relieved with her medications.  Denies dizziness, syncope, seizures, numbness, tingling, tremor, tics, unsteady gait, slurred speech, confusion.  Denies muscle or joint pain, stiffness, or dystonia.  Individual Medical History/ Review of Systems: Changes? :No     Past medications for mental health diagnoses include: Amitriptylline for sleep? Celexa,  Zoloft , Cymbalta , Wellbutrin  caused migraines to worsen,  Lunesta , Ativan , Belsomra , Temazapam,  trazodone  not effective at low doses but at 100 mg she was too groggy, Mirtazapine  caused extreme wt gain, Sonata  ineffective, Ambien   Topamax  for migraines, somewhat effective  Allergies: Wellbutrin   [bupropion ]  Current Medications:  Current Outpatient Medications:    ALPRAZolam  (XANAX ) 1 MG tablet, Take 2 tablets (2 mg total) by mouth at bedtime., Disp: 60 tablet, Rfl: 5   Atogepant  (QULIPTA ) 60 MG TABS, Take 1 tablet (60 mg total) by mouth daily., Disp: 30 tablet, Rfl: 11   botulinum toxin Type A  (BOTOX ) 200 units injection, Inject 155 units IM into multiple site in the face,neck and head once every 90 days, Disp: 1 each, Rfl: 4   DULoxetine  (CYMBALTA ) 60 MG capsule, Take 2 capsules (120 mg total) by mouth daily., Disp: 180 capsule, Rfl: 1   hydrOXYzine  (ATARAX ) 25 MG tablet, TAKE 1 TABLET FOR SLEEP AS NEEDED, Disp: 90 tablet, Rfl: 1   lurasidone  (LATUDA ) 80 MG TABS tablet, Take 1 tablet (80 mg total) by mouth daily with breakfast., Disp: 90 tablet, Rfl: 1   Methylfol-Algae-B12-Acetylcyst (CEREFOLIN NAC) 6-90.314-2-600 MG TABS, Take 1 tablet by mouth daily., Disp: 30 tablet, Rfl: 11   progesterone (PROMETRIUM) 100 MG capsule, Take 100 mg by mouth daily., Disp: , Rfl:    Rimegepant Sulfate (NURTEC) 75 MG TBDP, Take 1 tablet (75 mg total) by mouth daily as needed., Disp: 16 tablet, Rfl: 5   Tirzepatide (MOUNJARO Ray), Inject into the skin., Disp: , Rfl:    tretinoin (RETIN-A) 0.1 % cream, , Disp: , Rfl:    acetaZOLAMIDE  (DIAMOX ) 250 MG tablet, TAKE 1 TABLET BY MOUTH TWICE A DAY, Disp: 180 tablet, Rfl: 1   Eszopiclone  3 MG TABS, Take 1 tablet (3 mg total) by mouth at bedtime as needed., Disp: 30 tablet, Rfl:  5   prazosin  (MINIPRESS ) 2 MG capsule, Take 1 capsule (2 mg total) by mouth at bedtime., Disp: 90 capsule, Rfl: 1 Medication Side Effects: H/A w/ Wellbutrin , Sexual dysfunction with Cymbalta    Family Medical/ Social History: Changes? no  MENTAL HEALTH EXAM:  There were no vitals taken for this visit.There is no height or weight on file to calculate BMI.  General Appearance: Casual and Well Groomed  Eye Contact:  Good  Speech:  Clear and Coherent and Normal Rate  Volume:  Normal   Mood:  Euthymic  Affect:  Congruent  Thought Process:  Goal Directed and Descriptions of Associations: Circumstantial  Orientation:  Full (Time, Place, and Person)  Thought Content: Logical   Suicidal Thoughts:  No  Homicidal Thoughts:  No  Memory:  WNL  Judgement:  Good  Insight:  Good  Psychomotor Activity:  Normal  Concentration:  Concentration: Good and Attention Span: Good  Recall:  Good  Fund of Knowledge: Good  Language: Good  Assets:  Communication Skills Desire for Improvement Financial Resources/Insurance Housing Resilience Transportation Vocational/Educational  ADL's:  Intact  Cognition: WNL  Prognosis:  Good   Dr. [place] is following labs.   GeneSight test results are in chart under Labs from 08/05/2020, had moderately reduced folic acid conversion  ECT-MADRS    Flowsheet Row Office Visit from 10/20/2023 in [facility] Office Visit from 08/31/2023 in [facility]  MADRS Total Score 17 38   GAD-7    Flowsheet Row Office Visit from 07/25/2022 in [facility] [place] Office Visit from 07/31/2020 in [facility]  Total GAD-7 Score 5 13   PHQ2-9    Flowsheet Row Office Visit from 07/25/2022 in [facility] [place] Office Visit from 03/30/2022 in [facility] [place] Video Visit from 02/25/2022 in [facility] [place] Office Visit from 07/31/2020 in [facility]  PHQ-2 Total Score 2 6 2 1   PHQ-9 Total Score 10 23 10 9     DIAGNOSES:    ICD-10-CM   1. Depression, major, recurrent, in partial remission (HCC)  F33.41     2. Generalized anxiety disorder  F41.1     3. 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 Nightmares  F51.5       Receiving Psychotherapy: Yes with  Marval Bunde, LCSW  RECOMMENDATIONS:  PDMP was reviewed.  Xanax  filled 11/16/2023.  Lunesta  filled 11/16/2023. I provided approximately  20 minutes of face to face time during this encounter, including time spent before and after the visit in records review, medical decision making, counseling pertinent to today's visit, and charting.   Overall Yolanda Wolfe is doing well.  She has a longstanding history of insomnia.  The Xanax , Lunesta  help some.  She will follow up with Dr. Neysa in a few months to see if he has other recommendations.  Continue Xanax  1 mg, 2 p.o. nightly as needed. Continue Cymbalta   60 mg, 2 every day.  Continue Cerafolin NAC, 1 daily. Continue Lunesta  3 mg, 1 p.o. nightly. Continue hydroxyzine  25 mg, 1 p.o. nightly as needed. Continue  Latuda   80 mg, 1 p.o. q. evening with food. Continue prazosin  2 mg, 1 p.o. nightly.   Continue therapy with Marval Bunde, LCSW. Return in 2 months.   Verneita Cooks,  PA-C

## 2023-12-26 ENCOUNTER — Ambulatory Visit: Admitting: Psychiatry

## 2023-12-26 DIAGNOSIS — F411 Generalized anxiety disorder: Secondary | ICD-10-CM

## 2023-12-26 NOTE — Progress Notes (Signed)
 [facility] Counselor/Therapist Progress Note  Patient ID: Yolanda Wolfe, MRN: [id number],    Date: 12/26/2023  Time Spent: 50 minutes   Treatment Type: Individual Therapy  Reported Symptoms:  anxiety, depression, insomnia some better, motivation improving    Mental Status Exam:  Appearance:   Casual and Neat     Behavior:  Appropriate, Sharing, and Motivated  Motor:  Normal  Speech/Language:   Clear and Coherent  Affect:  Depressed and anxious  Mood:  anxious and depressed  Thought process:  goal directed  Thought content:    Rumination and ruminating about going back to work  Sensory/Perceptual disturbances:    WNL  Orientation:  oriented to person, place, time/date, situation, day of week, month of year, year, and stated date of December 26, 2023  Attention:  Good  Concentration:  Good  Memory:  WNL  Fund of knowledge:   Good  Insight:    Good  Judgment:   Good  Impulse Control:  Good   Risk Assessment: Danger to Self:  No Self-injurious Behavior: No Danger to Others: No Duty to Warn:no Physical Aggression / Violence:No  Access to Firearms a concern: No  Gang Involvement:No   Subjective:   Patient showing good effort and focus today further on her anxiety, depression, insomnia some better, and motivation. Concerned about a hearing for their high school daughter tomorrow and she got a notice that she could not return to school at previous school do school districting and patient and daughter's father plan to appeal their case to the school in a meeting. 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 Needed a lot of session time today to discuss this change and work on some problem-solving of this issue, with help of other resources who have been in similar situations but with different school. Patient plans to reach out to faculty at a school who is already using the system that  patient will be new to this school year. Explained this more in detail and talked through her fears and concerns. Frustrated, angry about this change. I feel like I have no control over my thoughts and hard to get to sleep. Has tried relaxation exercises without benefit. Is working with the meds she has been given as they have worked on some occasions. May be more of an issue when patient is stressed more.  To continue to work on her relaxation and be able to calm herself as she has on some other occasions including trying to use some deep breathing exercises and using her medication as prescribed.  Does seem to be working well with her ex-husband on behalf of their daughter regarding some school issues for the coming school year.  Had some good vacation time recently.  Processed some of her nervousness about returning to school this year but feels it is more related to some of the changes than it is her actual job, although the changes in affect her job in certain ways.   Interventions: Cognitive Behavioral Therapy, Solution-Oriented/Positive Psychology, and Ego-Supportive Identify life conflicts and/or situations including from her past and in the present, that support her current symptomology of anxiety and depression. Develop behavioral and cognitive strategies to reduce or eliminate excessive anxiety and depression.  Identify, challenge, and replace negative self talk with more positive, realistic, and empowering self talk. Patient will develop healthier self talk as a means of feeling  better about herself and better managing her anxiety and depression. Patient to increase daily social experiences and work on strengthening a new non-avoidant approach and build self-confidence. Patient to work on developing reality based, positive cognitive messages.   Diagnosis:   ICD-10-CM   1. Generalized anxiety disorder  F41.1      Plan:  Patient today working well on her anxiety, insomnia, depression, and  motivation.  Remains concerned about a hearing for her daughter at school tomorrow and is hoping that goes well.  Does have a backup plan if it does not.  Focusing more on problem solving, working along with ex-husband, and trying to help their daughter be prepared for changes that she may or may not encounter this upcoming school year and decision should be made soon on that.  Entheses not all details included in this note due to patient privacy needs).  Patient was seeming less anxious by end of session but is obviously carrying some anxiety due to some sensitive issues they are dealing with right now for their daughter and regarding patient's work changes this coming year at her job.  Follow through with previously suggested strategies in terms of being sure she gets enough rest and trying to get better sleep, remaining on her medications as prescribed and if needed contacting her medication prescriber if patient continues to have concerns with medication not being effective.  Goal review and progress/challenges noted with patient.  Next appointment within 2 weeks.   Barnie Bunde, LCSW

## 2024-01-04 ENCOUNTER — Ambulatory Visit: Admitting: Psychiatry

## 2024-01-17 ENCOUNTER — Other Ambulatory Visit (HOSPITAL_COMMUNITY): Payer: Self-pay

## 2024-01-22 ENCOUNTER — Other Ambulatory Visit: Payer: Self-pay

## 2024-01-22 ENCOUNTER — Ambulatory Visit: Admitting: Psychiatry

## 2024-01-22 NOTE — Progress Notes (Signed)
 Specialty Pharmacy Refill Coordination Note  Yolanda Wolfe is a 48 y.o. female assessed today regarding refills of clinic administered specialty medication(s) OnabotulinumtoxinA  (Botox )   Clinic requested Courier to Provider Office   Delivery date: 01/29/24   Verified address: [facility]   [address]   [place] [address]   Medication will be filled on 01/26/24.

## 2024-01-23 ENCOUNTER — Other Ambulatory Visit: Payer: Self-pay | Admitting: Physician Assistant

## 2024-01-23 ENCOUNTER — Ambulatory Visit: Admitting: Psychiatry

## 2024-01-23 DIAGNOSIS — F411 Generalized anxiety disorder: Secondary | ICD-10-CM

## 2024-01-23 NOTE — Progress Notes (Signed)
 [facility] Counselor/Therapist Progress Note  Patient ID: Dashea Mcmullan, MRN: [id number],    Date: 01/23/2024  Time Spent: 53 minutes   Treatment Type: Individual Therapy  Reported Symptoms: anxiety, depression, insomnia definitely better, motivation still a work in progress   Mental Status Exam:  Appearance:   Neat and Well Groomed     Behavior:  Appropriate and Sharing  Motor:  Normal  Speech/Language:   Clear and Coherent  Affect:  anxious  Mood:  anxious  Thought process:  goal directed  Thought content:    WNL  Sensory/Perceptual disturbances:    WNL  Orientation:  oriented to person, place, time/date, situation, day of week, month of year, year, and stated date of Aug. 5, 2025  Attention:  Fair  Concentration:  Fair  Memory:  WNL  Fund of knowledge:   Good  Insight:    Good  Judgment:   Good  Impulse Control:  Good   Risk Assessment: Danger to Self:  No Self-injurious Behavior: No Danger to Others: No Duty to Warn:no Physical Aggression / Violence:No  Access to Firearms a concern: No  Gang Involvement:No   Subjective:    Patient today in session continuing to work on her anxiety, depression, motivation, and notes that her insomnia is better.  Is sleeping longer once she gets to sleep. Also continuing to work on her problem solving in more positive ways. 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 Trying to be more positive than negative and does feel getting back into a routine will be helpful. Less anger over all than last session. No longer feeling no control over my thoughts and is sleeping better. Vacation time went ok but is more stressed around her inlaws. The inlaw situation is quite challenging/stressful and patient felt 1 person was especially pushy with the alcohol. Remaining on meds as prescribed. Definitely in peri-menopause and having some migraines and is on meds for that.    Identify life conflicts and/or situations including from her past and in the present, that support her current symptomology of anxiety and depression. Develop behavioral and cognitive strategies to reduce or eliminate excessive anxiety and depression.  Identify, challenge, and replace negative self talk with more positive, realistic, and empowering self talk. Patient will develop healthier self talk as a means of feeling better about herself and better managing her anxiety and depression. Patient to increase daily social experiences and work on strengthening a new non-avoidant approach and build self-confidence. Patient to work on developing reality based, positive cognitive messages.  Diagnosis:   ICD-10-CM   1. Generalized anxiety disorder  F41.1      Plan:    Patient in session today working further on positive problem-solving, more encouraging self-talk and coaching herself along, working further on her depression and anxiety.  Was very grateful that her daughter is going to be able to stay at the high school where she was enrolled and complete her high school years.  This had become very much a stressor for daughter and patient so patient is feeling a lot of relief at this point.  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  Also trying to remember that she has a number of friends a months the faculty there at her school and they are supportive of each other.  Admits that she got used to her summer routine of not much stress and knows that the school environment can be stressful.  Discussed her stress management strategies that have tended to work versus those that have tended not to work,  which seemed helpful to patient.  Encouraged to get some good rest before the school year starts and continue a regular sleep pattern as much as possible during the school year as that has been helpful to patient in the past.  Does continue to stay on her medications as prescribed and feels positive about that.  Continues her work on anxiety, motivation, and depression and is showing progress.  Goal review and progress/challenges noted with patient.  Next appointment within 3 weeks.   Barnie Bunde, LCSW

## 2024-01-25 ENCOUNTER — Other Ambulatory Visit: Payer: Self-pay

## 2024-02-02 ENCOUNTER — Encounter: Payer: Self-pay | Admitting: Neurology

## 2024-02-02 ENCOUNTER — Ambulatory Visit: Admitting: Neurology

## 2024-02-02 DIAGNOSIS — Z029 Encounter for administrative examinations, unspecified: Secondary | ICD-10-CM

## 2024-02-09 ENCOUNTER — Ambulatory Visit: Admitting: Neurology

## 2024-02-09 DIAGNOSIS — G43709 Chronic migraine without aura, not intractable, without status migrainosus: Secondary | ICD-10-CM

## 2024-02-09 MED ORDER — ONABOTULINUMTOXINA 100 UNITS IJ SOLR
200.0000 [IU] | Freq: Once | INTRAMUSCULAR | Status: AC
  Administered 2024-02-09: 155 [IU] via INTRAMUSCULAR

## 2024-02-09 NOTE — Progress Notes (Signed)

## 2024-02-13 ENCOUNTER — Ambulatory Visit: Admitting: Psychiatry

## 2024-02-13 DIAGNOSIS — F411 Generalized anxiety disorder: Secondary | ICD-10-CM

## 2024-02-13 NOTE — Progress Notes (Signed)
 [facility] Counselor/Therapist Progress Note  Patient ID: Yolanda Wolfe, MRN: [id number],    Date: 02/13/2024  Time Spent: 53 minutes   Treatment Type: Individual Therapy  Reported Symptoms: Anxiety, decreased depression, motivation, insomnia continues to be better   Mental Status Exam:  Appearance:   Neat     Behavior:  Appropriate, Sharing, and Motivated  Motor:  Normal  Speech/Language:   Clear and Coherent  Affect:  anxious  Mood:  anxious  Thought process:  goal directed  Thought content:    Rumination  Sensory/Perceptual disturbances:    WNL  Orientation:  oriented to person, place, time/date, situation, day of week, month of year, year, and stated date of Aug. 26, 2025  Attention:  Good  Concentration:  Good  Memory:  WNL  Fund of knowledge:   Good  Insight:    Good  Judgment:   Good  Impulse Control:  Good   Risk Assessment: Danger to Self:  No Self-injurious Behavior: No Danger to Others: No Duty to Warn:no Physical Aggression / Violence:No  Access to Firearms a concern: No  Gang Involvement:No   Subjective:    Patient today working in session further on her anxiety, depression, decreased motivation, scared and worked up and those symptoms are some better. It has helped getting back ito my routine at work and just getting in the door. First day ended up going pretty good, definitely better than expected. Students returned and things are better but some decrease in desire to go to work. Is still able  to go to sleep but staying asleep is more of a problem.Trying not to dwell on my job so much. I want to figure out how to stay out of that cycle of being very stressed out. Sharing and processing family stressor re: issues with step-daughter. Concerned for her own daughter who has anxiety and is improving; patient very maternal and wanting to take care of her.  Co-parents working well together.  Focused more on getting better sleep.  Feeling some  relief about her job and that things have gone well so far even though it has been a short period of time in this academic year.  Some stress continues with in-laws.  Believing in herself more.   Interventions: Cognitive Behavioral Therapy, Solution-Oriented/Positive Psychology, and Ego-Supportive Identify life conflicts and/or situations including from her past and in the present, that support her current symptomology of anxiety and depression. Develop behavioral and cognitive strategies to reduce or eliminate excessive anxiety and depression.  Identify, challenge, and replace negative self talk with more positive, realistic, and empowering self talk. Patient will develop healthier self talk as a means of feeling better about herself and better managing her anxiety and depression. Patient to increase daily social experiences and work on strengthening a new non-avoidant approach and build self-confidence. Patient to work on developing reality based, positive cognitive messages.    Diagnosis:   ICD-10-CM   1. Generalized anxiety disorder  F41.1      Plan:     Patient focusing further in session today on having more encouraging self talk, reducing her anxiety, positive problem-solving, coaching herself along, as she works further on her depression and anxiety.  Feeling good about her relationship with daughter that lives with her heart at the time.  Daughter seems to be off to a good start in school which is a good sign.  Patient feeling some, about the school issues.  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  Starting to realize also how some of her worst fears do not necessarily play out in reality.  Remains on her medications as prescribed and feels they are definitely helping her.  Will continue to work on anxiety issues, motivation, and depression.  Goal  review and progress/challenges noted with patient.  Next appointment within 3 weeks.   Barnie Bunde, LCSW

## 2024-02-21 NOTE — Progress Notes (Deleted)
 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 She describes may years of struggling to fall asleep and/or stay asleep, waking up tired no matter how much I sleep. She had previous sleep eval by Dr Karlynn Ee with HST neg for OSA then. Father has OSA. Denies cataplexy or sleep paralysis.  Describes vivid dreams. Says husband not noting loud snoring or unusual movement/ activity in sleep. Now taking Xanax  2 mg and lunesta  3 mg. These may help some. Previously has tried ambien , temazepam , dalmane, belsomra , mybe doxepin and some otcs.   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 At LOV I suggested trying hydroxyzine  before bed, to start replacing Lunesta , recommend CBT group, suggest trial caffeine tb for morning slow start and maybe at lunch time.    ROS-see HPI   + = positive Constitutional:    +weight loss, night sweats, fevers, chills, fatigue, lassitude. HEENT:    headaches, difficulty swallowing, +tooth/dental problems, sore throat,       sneezing, itching, +ear ache, nasal congestion, post nasal drip, snoring CV:    chest pain, orthopnea, PND, swelling in lower extremities, anasarca,                                   dizziness, palpitations Resp:   shortness of breath with exertion or at rest.                productive cough,   non-productive cough, coughing up  of blood.              change in color of mucus.  wheezing.   Skin:    rash or lesions. GI:  +heartburn, +indigestion, abdominal pain, nausea, vomiting, diarrhea,                 change in bowel habits, +loss of appetite GU: dysuria, change in color of urine, no urgency or frequency.   flank pain. MS:   joint pain, stiffness, decreased range of motion, back pain. Neuro-     nothing unusual Psych:  change in mood or affect.  depression or +anxiety.   memory loss.  OBJ- Physical Exam General- Alert, Oriented, Affect-appropriate, Distress- none acute, medium build Skin- rash-none, lesions- none, excoriation- none Lymphadenopathy- none Head- atraumatic            Eyes- Gross vision intact, PERRLA, conjunctivae and secretions clear            Ears- Hearing, canals-normal            Nose- Clear, no-Septal dev, mucus, polyps, erosion, perforation             Throat- Mallampati III , mucosa clear , drainage- none, tonsils- atrophic, +teeth Neck- flexible , trachea midline, no stridor , thyroid  nl, carotid no bruit Chest - symmetrical excursion , unlabored           Heart/CV- RRR , no murmur , no gallop  , no  rub, nl s1 s2                           - JVD- none , edema- none, stasis changes- none, varices- none           Lung- clear to P&A, wheeze- none, cough- none , dullness-none, rub- none           Chest wall-  Abd-  Br/ Gen/ Rectal- Not done, not indicated Extrem- cyanosis- none, clubbing, none, atrophy- none, strength- nl Neuro- grossly intact to observation

## 2024-02-22 ENCOUNTER — Ambulatory Visit: Admitting: Internal Medicine

## 2024-02-26 LAB — HM COLONOSCOPY

## 2024-02-27 ENCOUNTER — Ambulatory Visit (INDEPENDENT_AMBULATORY_CARE_PROVIDER_SITE_OTHER): Admitting: Physician Assistant

## 2024-02-27 ENCOUNTER — Encounter: Payer: Self-pay | Admitting: Physician Assistant

## 2024-02-27 VITALS — BP 120/84 | HR 67

## 2024-02-27 DIAGNOSIS — F5105 Insomnia due to other mental disorder: Secondary | ICD-10-CM | POA: Diagnosis not present

## 2024-02-27 DIAGNOSIS — F411 Generalized anxiety disorder: Secondary | ICD-10-CM

## 2024-02-27 DIAGNOSIS — E7212 Methylenetetrahydrofolate reductase deficiency: Secondary | ICD-10-CM | POA: Diagnosis not present

## 2024-02-27 DIAGNOSIS — F329 Major depressive disorder, single episode, unspecified: Secondary | ICD-10-CM | POA: Diagnosis not present

## 2024-02-27 DIAGNOSIS — F99 Mental disorder, not otherwise specified: Secondary | ICD-10-CM

## 2024-02-27 MED ORDER — PRAZOSIN HCL 1 MG PO CAPS: 3.0000 mg | ORAL_CAPSULE | Freq: Every day | ORAL | 1 refills | Status: DC

## 2024-02-27 NOTE — Progress Notes (Signed)
 [facility] Med Check  Patient ID: Yolanda Wolfe,  MRN: [id number]  PCP: [place], Betty G, MD  Date of Evaluation: 02/27/2024 Time spent:30 minutes  Chief Complaint:  Chief Complaint   Depression    HISTORY/CURRENT STATUS: HPI  For routine med check.   I feel like I'm just passing time. I don't have any joy. Never feels happy, cries easily, doesn't enjoy anything she used to.  Energy and motivation are very low. 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  Feels hopeless like she'll never get better.  Having weird vivid, disturbing dreams again.  Never feels rested no matter how much she sleep.  ADLs are at a bare minimum.  Hygiene is decrease unless she has to go somewhere.  Denies any changes in concentration, making decisions, or remembering things.  Appetite has not changed. Still has anxiety, more overwhelmed than PA.  Xanax  is effective when needed.  Has thoughts of 'not wanting to be here' but no plan for suicide.  No HI.  No reports of increased energy with decreased need for sleep, increased talkativeness, racing thoughts, impulsivity or risky behaviors, increased spending, increased libido, grandiosity, increased irritability or anger, paranoia, or hallucinations.  Review of Systems  Constitutional:  Positive for malaise/fatigue.  HENT: Negative.    Eyes: Negative.   Respiratory: Negative.    Cardiovascular: Negative.   Gastrointestinal: Negative.   Genitourinary: Negative.   Musculoskeletal: Negative.   Skin: Negative.   Neurological: Negative.   Endo/Heme/Allergies: Negative.   Psychiatric/Behavioral:         See HPI   Individual Medical History/ Review of Systems: Changes? :No     Past medications for mental health diagnoses include: Amitriptylline for sleep? Celexa,  Zoloft , Cymbalta , Wellbutrin  caused migraines to worsen,  Lunesta , Ativan , Belsomra , Temazapam,  trazodone  not effective at low  doses but at 100 mg she was too groggy, Mirtazapine  caused extreme wt gain, Sonata  ineffective, Ambien   Topamax  for migraines, somewhat effective  Allergies: Wellbutrin  [bupropion ]  Current Medications:  Current Outpatient Medications:    ALPRAZolam  (XANAX ) 1 MG tablet, Take 2 tablets (2 mg total) by mouth at bedtime., Disp: 60 tablet, Rfl: 5   Atogepant  (QULIPTA ) 60 MG TABS, Take 1 tablet (60 mg total) by mouth daily., Disp: 30 tablet, Rfl: 11   botulinum toxin Type A  (BOTOX ) 200 units injection, Inject 155 units IM into multiple site in the face,neck and head once every 90 days, Disp: 1 each, Rfl: 4   DULoxetine  (CYMBALTA ) 60 MG capsule, Take 2 capsules (120 mg total) by mouth daily., Disp: 180 capsule, Rfl: 1   Eszopiclone  3 MG TABS, Take 1 tablet (3 mg total) by mouth at bedtime as needed., Disp: 30 tablet, Rfl: 5   hydrOXYzine  (ATARAX ) 25 MG tablet, TAKE 1 TABLET FOR SLEEP AS NEEDED, Disp: 90 tablet, Rfl: 1   lurasidone  (LATUDA ) 80 MG TABS tablet, TAKE 1 TABLET (80 MG TOTAL) BY MOUTH DAILY WITH BREAKFAST., Disp: 60 tablet, Rfl: 0   Methylfol-Algae-B12-Acetylcyst (CEREFOLIN NAC) 6-90.314-2-600 MG TABS, Take 1 tablet by mouth daily., Disp: 30 tablet, Rfl: 11   NURTEC 75 MG TBDP, TAKE 1 TABLET (75 MG TOTAL) BY MOUTH DAILY AS NEEDED, Disp: 16 tablet, Rfl: 5   prazosin  (MINIPRESS ) 1 MG capsule, Take 3 capsules (3 mg total) by mouth at bedtime., Disp: 90 capsule, Rfl: 1   Tirzepatide (MOUNJARO [place]), Inject into the skin., Disp: , Rfl:    tretinoin (RETIN-A) 0.1 %  cream, , Disp: , Rfl:    acetaZOLAMIDE  (DIAMOX ) 250 MG tablet, TAKE 1 TABLET BY MOUTH TWICE A DAY (Patient not taking: Reported on 02/27/2024), Disp: 180 tablet, Rfl: 1   progesterone (PROMETRIUM) 100 MG capsule, Take 100 mg by mouth daily., Disp: , Rfl:  Medication Side Effects: H/A w/ Wellbutrin , Sexual dysfunction with Cymbalta    Family Medical/ Social History: Changes? no  MENTAL HEALTH EXAM:  Blood pressure 120/84, pulse 67.There  is no height or weight on file to calculate BMI.  General Appearance: Casual and Well Groomed  Eye Contact:  Good  Speech:  Clear and Coherent and Normal Rate  Volume:  Normal  Mood:  Depressed and Hopeless  Affect:  Depressed and Tearful  Thought Process:  Goal Directed and Descriptions of Associations: Circumstantial  Orientation:  Full (Time, Place, and Person)  Thought Content: Logical   Suicidal Thoughts:  No  Homicidal Thoughts:  No  Memory:  WNL  Judgement:  Good  Insight:  Good  Psychomotor Activity:  Normal  Concentration:  Concentration: Good and Attention Span: Good  Recall:  Good  Fund of Knowledge: Good  Language: Good  Assets:  Communication Skills Desire for Improvement Financial Resources/Insurance Housing Resilience Transportation Vocational/Educational  ADL's:  Intact  Cognition: WNL  Prognosis:  Good   Dr. Swaz[place] is following labs.   GeneSight test results are in chart under Labs from 08/05/2020, had moderately reduced folic acid conversion  ECT-MADRS    Flowsheet Row Office Visit from 02/27/2024 in [facility] Office Visit from 10/20/2023 in [facility] Office Visit from 08/31/2023 in [facility]  MADRS Total Score 34 17 38   GAD-7    Flowsheet Row Office Visit from 07/25/2022 in [facility] [place] Office Visit from 07/31/2020 in [facility]  Total GAD-7 Score 5 13   PHQ2-9    Flowsheet Row Office Visit from 07/25/2022 in [facility] [place] Office Visit from 03/30/2022 in [facility] [place] Video Visit from 02/25/2022 in [facility] [place] Office Visit from 07/31/2020 in [facility]  PHQ-2 Total Score 2 6 2 1   PHQ-9 Total Score 10 23 10 9    DIAGNOSES:    ICD-10-CM   1. Treatment-resistant depression  F32.9      2. 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 Insomnia due to other mental disorder  F51.05    F99      Receiving Psychotherapy: Yes with Marval Bunde, LCSW  RECOMMENDATIONS:  PDMP was reviewed.  Xanax  filled 12/17/2023.  Lunesta  filled 01/21/2024. I provided approximately 30 minutes of face to face time during this encounter, including time spent before and after the visit in records review, medical decision making, counseling pertinent to today's visit, and charting.   Lashona has taken many different antidepressants in the past, some may have worked for a little while and then the effects pooped out, the SE were an issue, or they weren't effective at all.  We discussed TMS and Spravato. I recommend Spravato of those two, I think there's more of a benefit long-term.  We discussed the drug, possible SE, the regimen will be twice/wk for a month, then we can decrease to once/wk for a month, and then qowk all depending on her response. She understands she'll be in  the office for at least 2 hours to monitor response, BP and Pulse ox.  She can't drive the rest of the day so will have someone pick her up.  She would like to try this treatment.  She spoke with Landry Molt, OM about insurance process.  We'll get started asap. No oral med changes at this time.   Continue Xanax  1 mg, 2 p.o. nightly as needed. Continue Cymbalta   60 mg, 2 every day.  Continue Cerafolin NAC, 1 daily. Continue Lunesta  3 mg, 1 p.o. nightly. Continue hydroxyzine  25 mg, 1 p.o. nightly as needed. Continue  Latuda   80 mg, 1 p.o. q. evening with food. Continue prazosin  3 mg, 1 p.o. nightly.   Continue therapy with Marval Bunde, LCSW. Return in 6 weeks, or asap after Spravato is approved.  Verneita Cooks, PA-C

## 2024-02-28 ENCOUNTER — Ambulatory Visit (INDEPENDENT_AMBULATORY_CARE_PROVIDER_SITE_OTHER): Admitting: Psychiatry

## 2024-02-28 DIAGNOSIS — F3341 Major depressive disorder, recurrent, in partial remission: Secondary | ICD-10-CM

## 2024-02-28 NOTE — Progress Notes (Signed)
 [facility] Counselor/Therapist Progress Note  Patient ID: Yolanda Wolfe, MRN: [id number],    Date: 02/28/2024  Time Spent: 50 minutes  Treatment Type: Individual Therapy  Reported Symptoms: depression, anxiety, less motivation, insomnia has improved    Mental Status Exam:  Appearance:   Casual and Neat     Behavior:  Appropriate and Sharing  Motor:  Normal  Speech/Language:   Clear and Coherent  Affect:  Depressed and anxious  Mood:  anxious and depressed  Thought process:  goal directed  Thought content:    Rumination  Sensory/Perceptual disturbances:    WNL  Orientation:  oriented to person, place, time/date, situation, day of week, month of year, year, and stated date of Sept. 10, 2025  Attention:  Fair  Concentration:  Fair  Memory:  WNL  Fund of knowledge:   Good  Insight:    Fair  Judgment:   Good  Impulse Control:  Good   Risk Assessment: Danger to Self:  No Self-injurious Behavior: No Danger to Others: No Duty to Warn:no Physical Aggression / Violence:No  Access to Firearms a concern: No  Gang Involvement:No    Subjective:   Patient in session today noting less progress and more challenges, but continuing her work on anxiety, depression. Feeling more down and depressed after having had some better days recently.  Noted that her thoughts changed to be more negative and self-defeating over past couple wks. Doesn't feel she can do much about it and tends to go right to the negative, self-doubting thoughts. Worked on these thoughts/feelings throughout session today as well as working on her changing certain behaviors and not taking herself right back to the negatives when things get more challenging, and instead be able to apply some of the more positive thoughts, behaviors, and outlook that she has had more recently before starting to feel down again.  Did work well in session today and seemed to have some hope for some changes that she can work on her  self but also in possibly trying a new medicine that her med provider is checking out for her.  Knows that sometimes she has gotten herself worked up and then things turned out okay but it is hard in the moment to recall those situations.  Wants to be able to refrain from getting in cycle of feeling stressed out.  Family stressors seem to have decreased some.  She is happy with daughter being able to return to her same school, and all that is going well.  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  Feels that marriage is going well and family is going well and spite of what she is feeling emotionally more recently.  Encouraged self-care strategies that she has employed before and to follow through on our discussion in session today.  Interventions: Cognitive Behavioral Therapy, Solution-Oriented/Positive Psychology, and Ego-Supportive Identify life conflicts and/or situations including from her past and in the present, that support her current symptomology of anxiety and depression. Develop behavioral and cognitive strategies to reduce or eliminate excessive anxiety and depression.  Identify, challenge, and replace negative self talk with more positive, realistic, and empowering self talk. Patient will develop healthier self talk as a means of feeling better about herself and better managing her anxiety and depression. Patient to increase daily social experiences and work on strengthening a new non-avoidant approach and build self-confidence. Patient to work on developing reality based, positive cognitive messages.  Diagnosis:   ICD-10-CM   1.  Depression, major, recurrent, in partial remission (HCC)  F33.41      Plan:    Patient in session today and working consistently on her increased depression and anxiety and feeling more down recently.  Doubting progress at times and we talked about this at length as well as other ways of confronting her anxiety and depression currently.  She is waiting to  hear about possible medication that might be helpful to her and her med provider is checking on this.  Is thankful that daughter is back in school and doing well and all is well with her parents with whom she is close and lives 2 doors away.  Trying not to make negative assumptions however that is very difficult for patient.  Reports she is staying on her medications as prescribed.  Will see again in approximately 2 weeks.  Goal review and progress/challenges noted with patient.  Next appointment within 3 weeks.   Barnie Bunde, LCSW

## 2024-03-06 ENCOUNTER — Ambulatory Visit

## 2024-03-06 ENCOUNTER — Encounter: Payer: Self-pay | Admitting: Physician Assistant

## 2024-03-08 ENCOUNTER — Telehealth: Payer: Self-pay

## 2024-03-08 ENCOUNTER — Telehealth: Payer: Self-pay | Admitting: Physician Assistant

## 2024-03-08 NOTE — Telephone Encounter (Signed)
 LVM per DPR that she needs to contact PCP for this.

## 2024-03-08 NOTE — Telephone Encounter (Signed)
 Pt called and said that she wants to get the COVID shot. Her pharmacy told her that if she is under 65 that need a prescription sent to the pharmacy or she is willing to come pick up a written script. Call her and let her know if she needs to come pick it up or [facility] [place] on [address]

## 2024-03-08 NOTE — Telephone Encounter (Signed)
 Noted

## 2024-03-08 NOTE — Telephone Encounter (Signed)
 Contacted pt to schedule her Spravato Treatments, discussed she will have 2 treatments a week for 4 weeks, then decided after that how her treatment will continue. Pt reports she will be out of town October 9th-14th she agreed to wait till after that before starting. Pt requests Monday/Wednesday afternoons, informed her perhaps by then there would be room on Monday afternoons. Pt reports she just needs something after 12 noon. Informed her we could set those up closer to starting. Discussed in detail Spravato and what to expect and answered any questions she had.  Verneita is aware of her starting in October as well.

## 2024-03-12 ENCOUNTER — Ambulatory Visit: Admitting: Psychiatry

## 2024-03-12 DIAGNOSIS — F329 Major depressive disorder, single episode, unspecified: Secondary | ICD-10-CM | POA: Diagnosis not present

## 2024-03-12 NOTE — Progress Notes (Signed)
 [facility] Counselor/Therapist Progress Note  Patient ID: Yolanda Wolfe, MRN: [id number],    Date: 03/12/2024  Time Spent: 55 minutes   Treatment Type: Individual Therapy  Reported Symptoms: Depression, anxiety, less motivation, insomnia has improved    Mental Status Exam:  Appearance:   Casual and Neat     Behavior:  Appropriate, Sharing, and some better with motivation but not the best  Motor:  Normal  Speech/Language:   Clear and Coherent  Affect:  Depressed and anxious  Mood:  anxious  Thought process:  goal directed  Thought content:    Rumination decreasing  Sensory/Perceptual disturbances:    WNL  Orientation:  oriented to person, place, time/date, situation, day of week, month of year, year, and stated date of Sept. 23, 2025  Attention:  Fair  Concentration:  Good and Fair  Memory:  WNL  Fund of knowledge:   Good  Insight:    Good  Judgment:   Good  Impulse Control:  Good   Risk Assessment: Danger to Self:  No Self-injurious Behavior: No Danger to Others: No Duty to Warn:no Physical Aggression / Violence:No  Access to Firearms a concern: No  Gang Involvement:No   Subjective:   Patient today in session reporting symptoms of anxiety and depression. Feels she is some better than last time. A bit flat and not super worried about being involved in too many things. Still some depression and motivational issues. Sometimes feel somewhat dazed but better over in the afternoon but by time I get home I'm tired again. To start Spravato in 3 weeks. Going on trip to [employer] [place] this weekend, and somewhat looking forward to that although reports her mood typically can be a challenge but hoping for an enjoyable weekend. Looking at what helps me in tough times and what does not help. Talking with relatives (parents and daughter by phone helps, talking with husband, listening to music, watching football on TV or online, emails on phone. I need to figure out  how to feel less blah. Wanting something up and happy. Recognizing I've felt bad a long time and wanting to work harder on helping myself be happier. Struggles with negatives but leaning a bit more towards possibility of her doing things that can help her get better. Review of strategies for working on this and how she can use strategies in different situations. Less self-negating and self-defeating. By session end I feel more relief after talking, and a little less depressed/anxious.  Has been feeling more down and depressed but showing also more effort and talking today more openly about working harder to believe more in herself and to feel more positive.  Responded well to some suggestions made and and focusing more on letting go of self-defeating thoughts, self doubting thoughts, and her tendency to go right to the negative.  Talked very openly about this in session today and was able to hold onto it which she noted that even in discussions like this is easy for her to easily let go and not hold onto that hope.  Less self doubting, trying to be more optimistic and hopeful, wanting to look more for what might go right versus wrong.  Looking at her blessings in life including her marriage and family, some aspects of her job and knowing that she is doing a good job.  Continue to encourage self-care strategies with patient and we will see her again within 2 weeks.   Interventions: Cognitive Behavioral Therapy, Solution-Oriented/Positive  Psychology, and Ego-Supportive Identify life conflicts and/or situations including from her past and in the present, that support her current symptomology of anxiety and depression. Develop behavioral and cognitive strategies to reduce or eliminate excessive anxiety and depression.  Identify, challenge, and replace negative self talk with more positive, realistic, and empowering self talk. Patient will develop healthier self talk as a means of feeling better about  herself and better managing her anxiety and depression. Patient to increase daily social experiences and work on strengthening a new non-avoidant approach and build self-confidence. Patient to work on developing reality based, positive cognitive messages.    Diagnosis:   ICD-10-CM   1. Treatment-resistant depression  F32.9      Plan:  Patient focusing well in session on her depression and anxiety and taking more responsibility for doing things that could contribute to better mood for her as noted above.  Seem to have more energy by end of session today and more motivation.  Not quite as self doubtful as she has been.  Is to begin Spravato in near future and hoping that will help also.  To return within 2 weeks.  Goal review and progress/challenges noted with patient.  Next appointment within 3 weeks.   Barnie Bunde, LCSW

## 2024-03-13 ENCOUNTER — Ambulatory Visit: Admitting: Psychiatry

## 2024-03-18 ENCOUNTER — Other Ambulatory Visit: Payer: Self-pay | Admitting: Physician Assistant

## 2024-03-21 ENCOUNTER — Other Ambulatory Visit: Payer: Self-pay | Admitting: Physician Assistant

## 2024-04-03 ENCOUNTER — Ambulatory Visit: Admitting: Psychiatry

## 2024-04-03 DIAGNOSIS — F329 Major depressive disorder, single episode, unspecified: Secondary | ICD-10-CM

## 2024-04-03 NOTE — Progress Notes (Signed)
 [facility] Counselor/Therapist Progress Note  Patient ID: Yolanda Wolfe, MRN: [id number],    Date: 04/03/2024  Time Spent: 53 minutes  Treatment Type: Individual Therapy  Reported Symptoms: anxiety, depression, less motivation    Mental Status Exam:  Appearance:   Neat     Behavior:  Appropriate and Sharing  Motor:  Normal  Speech/Language:   Clear and Coherent  Affect:  Depressed and anxious  Mood:  anxious and depressed  Thought process:  goal directed  Thought content:    Ruminating decreased  Sensory/Perceptual disturbances:    WNL  Orientation:  oriented to person, place, time/date, situation, day of week, month of year, year, and stated date of Oct. 5, 2025  Attention:  Good  Concentration:  Good  Memory:  WNL  Fund of knowledge:   Good  Insight:    Good  Judgment:   Good  Impulse Control:  Good   Risk Assessment: Danger to Self:  No Self-injurious Behavior: No Danger to Others: No Duty to Warn:no Physical Aggression / Violence:No  Access to Firearms a concern: No  Gang Involvement:No   Subjective:    Patient in session today working further on her symptoms of anxiety, depression, and some decrease in motivation. States she has been working hard and has had some success in improving her mood at times. Still feeling flat at times. Just came back from a vacation and feeling flat as we were not ready to come back.  Discussed some recent strategies that she had used with benefit.  Patient shared that when she goes away for finding comes home she has a flat feeling and therefore that affects her mood and outlook.  She was feeling some of that now as they just returned from a vacation.  Also shared that she understood she was to start Spravato treatment soon and had not heard anything back on that.  She is checking with someone in our front office today on that issue.  I did notice the more she let herself get engaged in the session, she talk more freely  although the affect remained rather flat.  Affect improved a little bit over the course of the session.  Not real happy with some of the way her scheduling is at work but is not really able to impact change on that.  Looked at something she might be able to do to help make that time go better at work.  Does feel that she is still some better but recognizing her mood is lower coming off of vacation and now returning to work involving some decisions that she was not in favor of.  Working further on how to adjust particularly on the job when she has expectations that she does not particularly like but can do, and the need to do them since they are part of her assigned work at this point.  Also working on choosing to change her mood sometimes by changing her outlook.  Also notes that whenever she is been away on vacation, coming back and getting back into the workflow on her job is challenging.  Talked about strategies that might be helpful with her in doing this and also helping her choose to look at some of the positives in her situation.  Does struggle with the negatives but is trying to be more open and receptive of positives especially if it is part of her getting better.  Encouraged less self negating and self-defeating thoughts. Encouraged less self doubting  and trying to hold on more to some optimism and hopefulness that gets triggered when we talk in conversations.  Interventions: Cognitive Behavioral Therapy, Solution-Oriented/Positive Psychology, and Ego-Supportive Identify life conflicts and/or situations including from her past and in the present, that support her current symptomology of anxiety and depression. Develop behavioral and cognitive strategies to reduce or eliminate excessive anxiety and depression.  Identify, challenge, and replace negative self talk with more positive, realistic, and empowering self talk. Patient will develop healthier self talk as a means of feeling better about  herself and better managing her anxiety and depression. Patient to increase daily social experiences and work on strengthening a new non-avoidant approach and build self-confidence. Patient to work on developing reality based, positive cognitive messages.    Diagnosis:   ICD-10-CM   1. Treatment-resistant depression  F32.9      Plan:  Patient openly talking in session focusing more on her depression and anxiety and having a hard time getting back into the flow of things after having been on vacation recently.  This is also been true of her mood and wanting it to be better.  There are a few changes at work that are happening this week which she is not real happy about but also knows she is fortunate to have her job and does not want to keep it.  Encouraged her to follow through on some of the strategies we discussed today in terms of things that can positively impact her mood, and also trying to believe in herself more.  She was checking after session today about any updated information as to when she might be starting the Spravato med program.  Will return within 2 weeks.  Goal review and progress/challenges noted with patient.  Next appointment within approximately 3 weeks.   Barnie Bunde, LCSW

## 2024-04-09 ENCOUNTER — Ambulatory Visit (INDEPENDENT_AMBULATORY_CARE_PROVIDER_SITE_OTHER): Admitting: Physician Assistant

## 2024-04-09 ENCOUNTER — Encounter: Payer: Self-pay | Admitting: Physician Assistant

## 2024-04-09 DIAGNOSIS — F5105 Insomnia due to other mental disorder: Secondary | ICD-10-CM | POA: Diagnosis not present

## 2024-04-09 DIAGNOSIS — F329 Major depressive disorder, single episode, unspecified: Secondary | ICD-10-CM

## 2024-04-09 DIAGNOSIS — F99 Mental disorder, not otherwise specified: Secondary | ICD-10-CM

## 2024-04-09 DIAGNOSIS — F518 Other sleep disorders not due to a substance or known physiological condition: Secondary | ICD-10-CM

## 2024-04-09 MED ORDER — DULOXETINE HCL 60 MG PO CPEP: 120.0000 mg | ORAL_CAPSULE | Freq: Every day | ORAL | 1 refills | Status: AC

## 2024-04-09 MED ORDER — LURASIDONE HCL 80 MG PO TABS: 80.0000 mg | ORAL_TABLET | Freq: Every day | ORAL | 1 refills | Status: AC

## 2024-04-09 NOTE — Progress Notes (Signed)
 [facility] Med Check  Patient ID: Yolanda Wolfe,  MRN: [id number]  PCP: [place], Betty G, MD  Date of Evaluation: 04/09/2024 Time spent:20 minutes  Chief Complaint:  Chief Complaint   Depression    HISTORY/CURRENT STATUS: HPI  For routine med check.   Yolanda Wolfe is still having depression. 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 Energy and motivation are low.  Feels sad and hopeless a lot of the time.  Sleeps well ok w/ Xanax  and Lunesta .  Still has weird dreams and sometimes nightmares but they're not as bad as before the Prazosin  was started.  Doesn't take the Xanax  during the day at all.  No PA.  ADLs and personal hygiene are normal.   Denies any changes in concentration, making decisions, or remembering things.  Appetite has not changed.   No mania, delirium, AH/VH.  No SI/HI.  Review of Systems  Constitutional:  Positive for malaise/fatigue.  HENT: Negative.    Eyes: Negative.   Respiratory: Negative.    Cardiovascular: Negative.   Genitourinary: Negative.   Musculoskeletal: Negative.   Skin: Negative.   Neurological: Negative.   Endo/Heme/Allergies: Negative.   Psychiatric/Behavioral:         See HPI.   Individual Medical History/ Review of Systems: Changes? :No     Past medications for mental health diagnoses include: Amitriptylline for sleep? Celexa,  Zoloft , Cymbalta , Wellbutrin  caused migraines to worsen,  Lunesta , Ativan , Belsomra , Temazapam,  trazodone  not effective at low doses but at 100 mg she was too groggy, Mirtazapine  caused extreme wt gain, Sonata  ineffective, Ambien   Topamax  for migraines, somewhat effective  Allergies: Wellbutrin  [bupropion ]  Current Medications:  Current Outpatient Medications:    ALPRAZolam  (XANAX ) 1 MG tablet, Take 2 tablets (2 mg total) by mouth at bedtime., Disp: 60 tablet, Rfl: 5   Atogepant  (QULIPTA ) 60 MG TABS, Take 1 tablet (60 mg total) by mouth daily., Disp: 30 tablet,  Rfl: 11   botulinum toxin Type A  (BOTOX ) 200 units injection, Inject 155 units IM into multiple site in the face,neck and head once every 90 days, Disp: 1 each, Rfl: 4   estradiol (LYLLANA) 0.0375 MG/24HR, Apply 1 patch twice a week by transdermal route., Disp: , Rfl:    Eszopiclone  3 MG TABS, Take 1 tablet (3 mg total) by mouth at bedtime as needed., Disp: 30 tablet, Rfl: 5   hydrOXYzine  (ATARAX ) 25 MG tablet, TAKE 1 TABLET FOR SLEEP AS NEEDED, Disp: 90 tablet, Rfl: 1   Methylfol-Algae-B12-Acetylcyst (CEREFOLIN NAC) 6-90.314-2-600 MG TABS, Take 1 tablet by mouth daily., Disp: 30 tablet, Rfl: 11   NURTEC 75 MG TBDP, TAKE 1 TABLET (75 MG TOTAL) BY MOUTH DAILY AS NEEDED, Disp: 16 tablet, Rfl: 5   prazosin  (MINIPRESS ) 1 MG capsule, Take 3 capsules (3 mg total) by mouth at bedtime., Disp: 90 capsule, Rfl: 1   progesterone (PROMETRIUM) 100 MG capsule, Take 100 mg by mouth daily., Disp: , Rfl:    Tirzepatide (MOUNJARO [place]), Inject into the skin., Disp: , Rfl:    tretinoin (RETIN-A) 0.1 % cream, , Disp: , Rfl:    acetaZOLAMIDE  (DIAMOX ) 250 MG tablet, TAKE 1 TABLET BY MOUTH TWICE A DAY (Patient not taking: Reported on 02/27/2024), Disp: 180 tablet, Rfl: 1   DULoxetine  (CYMBALTA ) 60 MG capsule, Take 2 capsules (120 mg total) by mouth daily., Disp: 180 capsule, Rfl: 1   lurasidone  (LATUDA ) 80 MG TABS tablet, Take 1 tablet (80 mg total) by mouth daily  with breakfast., Disp: 90 tablet, Rfl: 1 Medication Side Effects: H/A w/ Wellbutrin , Sexual dysfunction with Cymbalta    Family Medical/ Social History: Changes? no  MENTAL HEALTH EXAM:  There were no vitals taken for this visit.There is no height or weight on file to calculate BMI.  General Appearance: Casual and Well Groomed  Eye Contact:  Good  Speech:  Clear and Coherent and Normal Rate  Volume:  Normal  Mood:  Depressed  Affect:  Depressed  Thought Process:  Goal Directed and Descriptions of Associations: Circumstantial  Orientation:  Full (Time,  Place, and Person)  Thought Content: Logical   Suicidal Thoughts:  No  Homicidal Thoughts:  No  Memory:  WNL  Judgement:  Good  Insight:  Good  Psychomotor Activity:  Normal  Concentration:  Concentration: Good and Attention Span: Good  Recall:  Good  Fund of Knowledge: Good  Language: Good  Assets:  Communication Skills Desire for Improvement Financial Resources/Insurance Housing Resilience Transportation Vocational/Educational  ADL's:  Intact  Cognition: WNL  Prognosis:  Good   Dr. [place] is following labs.   GeneSight test results are in chart under Labs from 08/05/2020, had moderately reduced folic acid conversion  ECT-MADRS    Flowsheet Row Office Visit from 02/27/2024 in [facility] Office Visit from 10/20/2023 in [facility] Office Visit from 08/31/2023 in [facility]  MADRS Total Score 34 17 38   GAD-7    Flowsheet Row Office Visit from 07/25/2022 in [facility] [place] Office Visit from 07/31/2020 in [facility]  Total GAD-7 Score 5 13   PHQ2-9    Flowsheet Row Office Visit from 07/25/2022 in [facility] [place] Office Visit from 03/30/2022 in [facility] Video Visit from 02/25/2022 in [facility] [place] Office Visit from 07/31/2020 in [facility]  PHQ-2 Total Score 2 6 2 1   PHQ-9 Total Score 10 23 10 9    DIAGNOSES:    ICD-10-CM   1. 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 Abnormal dreams  F51.8       Receiving Psychotherapy: Yes with Marval Bunde, LCSW  RECOMMENDATIONS:  PDMP was reviewed.  Xanax  filled 02/26/2024.  Lunesta  filled 03/26/2024. I provided approximately  20  minutes of face to face time during this encounter, including time spent before and  after the visit in records review, medical decision making, counseling pertinent to today's visit, and charting.   She starts Spravato next week. 56 mg the first day, then 84 mg thereafter.  She has no questions about it.   Will watch the abnormal dreams/nightmares.  Consider increasing the Prazosin .   Continue Xanax  1 mg, 2 p.o. nightly as needed. Continue Cymbalta   60 mg, 2 every day.  Continue Cerafolin NAC, 1 daily. Continue Lunesta  3 mg, 1 p.o. nightly. Continue hydroxyzine  25 mg, 1 p.o. nightly as needed. Continue  Latuda   80 mg, 1 p.o. q. evening with food. Continue prazosin  3 mg, 1 p.o. nightly.   Continue therapy with Marval Bunde, LCSW. Return in 6 days to start Spravato.   Verneita Cooks, PA-C

## 2024-04-11 ENCOUNTER — Other Ambulatory Visit: Payer: Self-pay

## 2024-04-11 DIAGNOSIS — F329 Major depressive disorder, single episode, unspecified: Secondary | ICD-10-CM

## 2024-04-11 MED ORDER — SPRAVATO (56 MG DOSE) 28 MG/DEVICE NA SOPK: 56.0000 mg | PACK | Freq: Once | NASAL | Status: AC

## 2024-04-11 MED ORDER — SPRAVATO (84 MG DOSE) 28 MG/DEVICE NA SOPK: 84.0000 mg | PACK | NASAL | Status: AC

## 2024-04-12 ENCOUNTER — Other Ambulatory Visit: Payer: Self-pay

## 2024-04-15 ENCOUNTER — Encounter: Payer: Self-pay | Admitting: Physician Assistant

## 2024-04-15 ENCOUNTER — Ambulatory Visit

## 2024-04-15 ENCOUNTER — Ambulatory Visit (INDEPENDENT_AMBULATORY_CARE_PROVIDER_SITE_OTHER): Admitting: Physician Assistant

## 2024-04-15 VITALS — BP 123/79 | HR 78

## 2024-04-15 DIAGNOSIS — F5105 Insomnia due to other mental disorder: Secondary | ICD-10-CM | POA: Diagnosis not present

## 2024-04-15 DIAGNOSIS — F99 Mental disorder, not otherwise specified: Secondary | ICD-10-CM | POA: Diagnosis not present

## 2024-04-15 DIAGNOSIS — F411 Generalized anxiety disorder: Secondary | ICD-10-CM | POA: Diagnosis not present

## 2024-04-15 DIAGNOSIS — F329 Major depressive disorder, single episode, unspecified: Secondary | ICD-10-CM | POA: Diagnosis not present

## 2024-04-15 NOTE — Progress Notes (Signed)
 NURSES NOTE:         Pt arrived for her 1st Spravato Treatment for treatment resistant depression, the starting dose is 56 mg (2 of the 28 mg nasal sprays), based on how she tolerates it today her dose may stay at 56 mg or increase to maintenance dose. Pt sees Verneita Cooks, [place] and she will follow up with her at each treatment. Explained to pt how the treatments would be scheduled and answered any questions she had today. She was given a practice nasal trainer to try a few times before given the one with medication. She verbalized understanding. Pt's Spravato is billed through buy and bill medically.  Spravato medication is stored at treatment center per REMS/FDA guidelines. The medication is required to be locked behind two doors per REMS/FDA protocol. Medication is also disposed of properly after each use per regulations. All documentation for REMS is completed and submitted per FDA/REMS requirements.          Began taking patient's vital signs at 2:20 PM 120/78, pulse 80. SpO2 Sat. 99%. Stable to proceed with treatment. Instructed patient to blow her nose if needed then recline back to a 45 degree angle. Gave patient first dose 28 mg nasal spray, administered in each nostril as directed and observed by nurse, waited 5 more minutes for the second dose. After both doses given pt did not complain of any nausea/vomiting, pt did have a drink to help with the taste of Spravato it gives the metallic taste. Assessed her 40 minute vitals, 3:05 PM, 125/74, pulse 66, SpO2 99%. Pt reports doing well. Explained she would be monitored for a total time of 120 minutes. Discharge vitals were taken at 4:22 PM 123/79, P 78, SpO2 99%. Verneita came to visit with patient once her thoughts were clearer to discuss how treatment went. Recommend she go home and sleep or just relax on the couch. No driving, no intense activities. Verbalized understanding. Pt. will be receiving 2 treatments per week for 4 weeks as recommended.  Nurse was with pt a total of 60 minutes for clinical assessment. Pt's PHQ-9 completed prior to starting treatment was 16 (severely moderate depression). Pt will receive 84 mg at her next treatment. Instructed to call with any issues prior to next apt on Wednesday.  (56 mg)  LOT [id number] EXP AUG 2028

## 2024-04-15 NOTE — Progress Notes (Unsigned)
 [facility]  Patient ID: Yolanda Wolfe,  MRN: [id number]  PCP: Jordan, Betty G, MD  Date of Evaluation: 04/15/2024 Time spent:60 minutes  Chief Complaint:  Chief Complaint   Depression; Other    HISTORY/CURRENT STATUS: HPI  For Spravato treatment #1.   Yolanda Wolfe has had depression most of her life.  Multiple antidepressants have been tried and either work for a while and stopped working or were not effective to begin with.  She has a hard time enjoying things.  She lacks energy and motivation.  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  Appetite is normal and weight is stable.  Personal hygiene is normal.  Sad and she has felt hopeless at times in the past.  Sleeps well most of the time.  Takes Lunesta  which is helpful.  Denies any changes in concentration, making decisions, or remembering things.  Anxiety is controlled with Xanax .  Not having panic attacks but feels like she might sometimes.  Mostly gets overwhelmed.  No mania, delirium, AH/VH.  No SI/HI.  Individual Medical History/ Review of Systems: Changes? :No     Past medications for mental health diagnoses include: Amitriptylline for sleep? Celexa,  Zoloft , Cymbalta , Wellbutrin  caused migraines to worsen,  Lunesta , Ativan , Belsomra , Temazapam,  trazodone  not effective at low doses but at 100 mg she was too groggy, Mirtazapine  caused extreme wt gain, Sonata  ineffective, Ambien   Topamax  for migraines, somewhat effective  Allergies: Wellbutrin  [bupropion ]  Current Medications:  Current Outpatient Medications:    ALPRAZolam  (XANAX ) 1 MG tablet, Take 2 tablets (2 mg total) by mouth at bedtime., Disp: 60 tablet, Rfl: 5   Atogepant  (QULIPTA ) 60 MG TABS, Take 1 tablet (60 mg total) by mouth daily., Disp: 30 tablet, Rfl: 11   botulinum toxin Type A  (BOTOX ) 200 units injection, Inject 155 units IM into multiple site in the face,neck and head once every 90 days,  Disp: 1 each, Rfl: 4   DULoxetine  (CYMBALTA ) 60 MG capsule, Take 2 capsules (120 mg total) by mouth daily., Disp: 180 capsule, Rfl: 1   estradiol (LYLLANA) 0.0375 MG/24HR, Apply 1 patch twice a week by transdermal route., Disp: , Rfl:    Eszopiclone  3 MG TABS, Take 1 tablet (3 mg total) by mouth at bedtime as needed., Disp: 30 tablet, Rfl: 5   hydrOXYzine  (ATARAX ) 25 MG tablet, TAKE 1 TABLET FOR SLEEP AS NEEDED, Disp: 90 tablet, Rfl: 1   lurasidone  (LATUDA ) 80 MG TABS tablet, Take 1 tablet (80 mg total) by mouth daily with breakfast., Disp: 90 tablet, Rfl: 1   Methylfol-Algae-B12-Acetylcyst (CEREFOLIN NAC) 6-90.314-2-600 MG TABS, Take 1 tablet by mouth daily., Disp: 30 tablet, Rfl: 11   NURTEC 75 MG TBDP, TAKE 1 TABLET (75 MG TOTAL) BY MOUTH DAILY AS NEEDED, Disp: 16 tablet, Rfl: 5   prazosin  (MINIPRESS ) 1 MG capsule, Take 3 capsules (3 mg total) by mouth at bedtime., Disp: 90 capsule, Rfl: 1   progesterone (PROMETRIUM) 100 MG capsule, Take 100 mg by mouth daily., Disp: , Rfl:    Tirzepatide (MOUNJARO [place]), Inject into the skin., Disp: , Rfl:    tretinoin (RETIN-A) 0.1 % cream, , Disp: , Rfl:    acetaZOLAMIDE  (DIAMOX ) 250 MG tablet, TAKE 1 TABLET BY MOUTH TWICE A DAY (Patient not taking: Reported on 02/27/2024), Disp: 180 tablet, Rfl: 1   [START ON 04/22/2024] Esketamine HCl, 84 MG Dose, (SPRAVATO, 84 MG DOSE,) 28 MG/DEVICE SOPK, Place  84 mg into the nose every 3 (three) days., Disp: , Rfl:  Medication Side Effects: H/A w/ Wellbutrin , Sexual dysfunction with Cymbalta    Family Medical/ Social History: Changes? no  MENTAL HEALTH EXAM:  There were no vitals taken for this visit.There is no height or weight on file to calculate BMI.  General Appearance: Casual and Well Groomed  Eye Contact:  Good  Speech:  Clear and Coherent and Normal Rate  Volume:  Normal  Mood:  Depressed  Affect:  Depressed  Thought Process:  Goal Directed and Descriptions of Associations: Circumstantial  Orientation:  Full  (Time, Place, and Person)  Thought Content: Logical   Suicidal Thoughts:  No  Homicidal Thoughts:  No  Memory:  WNL  Judgement:  Good  Insight:  Good  Psychomotor Activity:  Normal  Concentration:  Concentration: Good and Attention Span: Good  Recall:  Good  Fund of Knowledge: Good  Language: Good  Assets:  Communication Skills Desire for Improvement Financial Resources/Insurance Housing Resilience Social Support Transportation Vocational/Educational  ADL's:  Intact  Cognition: WNL  Prognosis:  Good   Dr. Jordan is following labs.   GeneSight test results are in chart under Labs from 08/05/2020, had moderately reduced folic acid conversion  Patient was administered Spravato 56 mg intranasally today.  The patient experienced the typical dissociation which gradually resolved over the 2-hour period of observation.  There were no complications.  Specifically the patient did not have nausea or vomiting or headache.  Blood pressures and pulse ox readings remained within normal ranges at the 40-minute and 2-hour follow-up intervals.  By the time the 2-hour observation period was met the patient was alert and oriented and able to exit without assistance. See nursing note for further details.  ECT-MADRS    Flowsheet Row Office Visit from 02/27/2024 in [facility] Office Visit from 10/20/2023 in [facility] Office Visit from 08/31/2023 in [facility]  MADRS Total Score 34 17 38   GAD-7    Flowsheet Row Office Visit from 07/25/2022 in [facility] [place] Office Visit from 07/31/2020 in [facility]  Total GAD-7 Score 5 13   PHQ2-9    Flowsheet Row Office Visit from 07/25/2022 in [facility] [place] Office Visit from 03/30/2022 in [facility] [place] Video Visit from 02/25/2022 in [facility] [place] Office Visit from 07/31/2020 in [facility]  PHQ-2 Total Score 2 6 2 1   PHQ-9 Total Score 10 23 10 9    DIAGNOSES:    ICD-10-CM   1. 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 Generalized anxiety disorder  F41.1       Receiving Psychotherapy: Yes with Marval Bunde, LCSW  RECOMMENDATIONS:  PDMP was reviewed.  Xanax  filled 02/26/2024.  Lunesta  filled 03/26/2024. I provided approximately  60 minutes of face to face time during this encounter, including time spent before and after the visit in records review, medical decision making, counseling pertinent to today's visit, and charting.   Yolanda Wolfe tolerated the treatment well today.  No changes will be made.  Continue Xanax  1 mg, 2 p.o. nightly as needed. Continue Cymbalta   60 mg, 2 every day.  Continue Cerafolin NAC, 1 daily. Continue Lunesta  3 mg, 1 p.o. nightly. Continue hydroxyzine  25 mg, 1  p.o. nightly as needed. Continue  Latuda   80 mg, 1 p.o. q. evening with food. Continue prazosin  3 mg, 1 p.o. nightly.   Continue therapy with Marval Bunde, LCSW. Return in 2 days.  Verneita Cooks, PA-C

## 2024-04-16 ENCOUNTER — Ambulatory Visit: Admitting: Neurology

## 2024-04-16 ENCOUNTER — Encounter: Payer: Self-pay | Admitting: Physician Assistant

## 2024-04-17 ENCOUNTER — Ambulatory Visit (INDEPENDENT_AMBULATORY_CARE_PROVIDER_SITE_OTHER): Admitting: Physician Assistant

## 2024-04-17 ENCOUNTER — Encounter: Payer: Self-pay | Admitting: Physician Assistant

## 2024-04-17 ENCOUNTER — Ambulatory Visit

## 2024-04-17 VITALS — BP 127/82 | HR 68

## 2024-04-17 DIAGNOSIS — F411 Generalized anxiety disorder: Secondary | ICD-10-CM

## 2024-04-17 DIAGNOSIS — F5105 Insomnia due to other mental disorder: Secondary | ICD-10-CM

## 2024-04-17 DIAGNOSIS — F329 Major depressive disorder, single episode, unspecified: Secondary | ICD-10-CM | POA: Diagnosis not present

## 2024-04-17 DIAGNOSIS — F99 Mental disorder, not otherwise specified: Secondary | ICD-10-CM | POA: Diagnosis not present

## 2024-04-17 NOTE — Progress Notes (Signed)
 NURSES NOTE:         Pt arrived for her 2nd Spravato Treatment for treatment resistant depression, the starting dose was 56 mg (2 of the 28 mg nasal sprays), pt tolerated that dose well she will receive 84 mg today which will be her maintenance dose. Pt reports she did well after her first treatment and feels good to increase her dose today. Pt sees Verneita Cooks, [place] and she will follow up with her at each treatment. Explained to pt how the treatments would be scheduled and answered any questions she had today. She was given a practice nasal trainer to try a few times before given the one with medication. She verbalized understanding. Pt's Spravato is billed through buy and bill medically.  Spravato medication is stored at treatment center per REMS/FDA guidelines. The medication is required to be locked behind two doors per REMS/FDA protocol. Medication is also disposed of properly after each use per regulations. All documentation for REMS is completed and submitted per FDA/REMS requirements.          Began taking patient's vital signs at 2:05 PM 131/80, pulse 64. SpO2 Sat. 99%. Stable to proceed with treatment. Instructed patient to blow her nose if needed then recline back to a 45 degree angle. Gave patient first dose 28 mg nasal spray, administered in each nostril as directed and observed by nurse, waited 5 more minutes for the second and third dose. After all doses given pt did not complain of any nausea/vomiting, pt did have a drink to help with the taste of Spravato it gives the metallic taste. Assessed her 40 minute vitals, 1:50 PM, 125/76, pulse 66, SpO2 97%. Pt reports doing well. Explained she would be monitored for a total time of 120 minutes. Pt did get up once and I walked with her to the bathroom, she was steady at that time. Discharge vitals were taken at 4:02 PM 127/82, P 68, SpO2 99%. Verneita came to visit with patient once her thoughts were clearer to discuss how treatment went. Recommend  she go home and sleep or just relax on the couch. No driving, no intense activities. Verbalized understanding. Pt. will be receiving 2 treatments per week for 4 weeks as recommended. Nurse was with pt a total of 60 minutes for clinical assessment. Pt will continue 84 mg for her treatments. Instructed to call with any issues prior to her apts next Monday and Wednesday.   (84 mg)  LOT [id number] EXP AUG 2028

## 2024-04-17 NOTE — Progress Notes (Unsigned)
 [facility]  Patient ID: Yolanda Wolfe,  MRN: [id number]  PCP: Jordan, Betty G, MD  Date of Evaluation: 04/17/2024 Time spent:65 minutes  Chief Complaint:  Chief Complaint   Depression; Other     HISTORY/CURRENT STATUS: HPI  For Spravato treatment #2.  Accompanied by her mom.  [name] states she already feels better after her first Spravato 2 days ago and then today's treatment as well.  Describes as not feeling as heavy.  She and her mom state she has been a high functioning depressed person for years.  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  Continues to have hope that the Spravato will help even more than she following feels it he has.  Sleeps fairly well but requires Xanax  to do so.   If she does not take it at night she has a very hard time falling asleep and staying asleep.  It is also helpful for sleep when needed.  No nightmares at this time.  Personal hygiene is normal.  Appetite has not changed.  Weight is stable.   No mania, delirium, AH/VH.  No SI/HI.  Individual Medical History/ Review of Systems: Changes? :No     Past medications for mental health diagnoses include: Amitriptylline for sleep? Celexa,  Zoloft , Cymbalta , Wellbutrin  caused migraines to worsen,  Lunesta , Ativan , Belsomra , Temazapam,  trazodone  not effective at low doses but at 100 mg she was too groggy, Mirtazapine  caused extreme wt gain, Sonata  ineffective, Ambien   Topamax  for migraines, somewhat effective  Allergies: Wellbutrin  [bupropion ]  Current Medications:  Current Outpatient Medications:    ALPRAZolam  (XANAX ) 1 MG tablet, Take 2 tablets (2 mg total) by mouth at bedtime., Disp: 60 tablet, Rfl: 5   Atogepant  (QULIPTA ) 60 MG TABS, Take 1 tablet (60 mg total) by mouth daily., Disp: 30 tablet, Rfl: 11   botulinum toxin Type A  (BOTOX ) 200 units injection, Inject 155 units IM into multiple site in the face,neck and head once every 90  days, Disp: 1 each, Rfl: 4   DULoxetine  (CYMBALTA ) 60 MG capsule, Take 2 capsules (120 mg total) by mouth daily., Disp: 180 capsule, Rfl: 1   [START ON 04/22/2024] Esketamine HCl, 84 MG Dose, (SPRAVATO, 84 MG DOSE,) 28 MG/DEVICE SOPK, Place 84 mg into the nose every 3 (three) days., Disp: , Rfl:    estradiol (LYLLANA) 0.0375 MG/24HR, Apply 1 patch twice a week by transdermal route., Disp: , Rfl:    Eszopiclone  3 MG TABS, Take 1 tablet (3 mg total) by mouth at bedtime as needed., Disp: 30 tablet, Rfl: 5   hydrOXYzine  (ATARAX ) 25 MG tablet, TAKE 1 TABLET FOR SLEEP AS NEEDED, Disp: 90 tablet, Rfl: 1   lurasidone  (LATUDA ) 80 MG TABS tablet, Take 1 tablet (80 mg total) by mouth daily with breakfast., Disp: 90 tablet, Rfl: 1   Methylfol-Algae-B12-Acetylcyst (CEREFOLIN NAC) 6-90.314-2-600 MG TABS, Take 1 tablet by mouth daily., Disp: 30 tablet, Rfl: 11   NURTEC 75 MG TBDP, TAKE 1 TABLET (75 MG TOTAL) BY MOUTH DAILY AS NEEDED, Disp: 16 tablet, Rfl: 5   prazosin  (MINIPRESS ) 1 MG capsule, Take 3 capsules (3 mg total) by mouth at bedtime., Disp: 90 capsule, Rfl: 1   progesterone (PROMETRIUM) 100 MG capsule, Take 100 mg by mouth daily., Disp: , Rfl:    Tirzepatide (MOUNJARO [place]), Inject into the skin., Disp: , Rfl:    tretinoin (RETIN-A) 0.1 % cream, , Disp: , Rfl:    acetaZOLAMIDE  (  DIAMOX ) 250 MG tablet, TAKE 1 TABLET BY MOUTH TWICE A DAY (Patient not taking: Reported on 02/27/2024), Disp: 180 tablet, Rfl: 1 Medication Side Effects: H/A w/ Wellbutrin , Sexual dysfunction with Cymbalta    Family Medical/ Social History: Changes? no  MENTAL HEALTH EXAM:  There were no vitals taken for this visit.There is no height or weight on file to calculate BMI.  General Appearance: Casual and Well Groomed  Eye Contact:  Good  Speech:  Clear and Coherent and Normal Rate  Volume:  Normal  Mood:  Euthymic  Affect:  Congruent  Thought Process:  Goal Directed and Descriptions of Associations: Circumstantial  Orientation:   Full (Time, Place, and Person)  Thought Content: Logical   Suicidal Thoughts:  No  Homicidal Thoughts:  No  Memory:  WNL  Judgement:  Good  Insight:  Good  Psychomotor Activity:  Normal  Concentration:  Concentration: Good and Attention Span: Good  Recall:  Good  Fund of Knowledge: Good  Language: Good  Assets:  Communication Skills Desire for Improvement Financial Resources/Insurance Housing Resilience Social Support Transportation Vocational/Educational  ADL's:  Intact  Cognition: WNL  Prognosis:  Good   Dr. Jordan is following labs.   GeneSight test results are in chart under Labs from 08/05/2020, had moderately reduced folic acid conversion  Patient was administered Spravato 84 mg intranasally today.  The patient experienced the typical dissociation which gradually resolved over the 2-hour period of observation.  There were no complications.  Specifically the patient did not have nausea or vomiting or headache.  Ox and blood pressures remained within normal ranges at the 40-minute and 2-hour follow-up intervals.  By the time the 2-hour observation period was met the patient was alert and oriented and able to exit without assistance.  See nursing note for further details.  ECT-MADRS    Flowsheet Row Office Visit from 02/27/2024 in [facility] Office Visit from 10/20/2023 in [facility] Office Visit from 08/31/2023 in [facility]  MADRS Total Score 34 17 38   GAD-7    Flowsheet Row Office Visit from 07/25/2022 in [facility] [place] Office Visit from 07/31/2020 in [facility]  Total GAD-7 Score 5 13   PHQ2-9    Flowsheet Row Office Visit from 07/25/2022 in [facility] [place] [facility] [place] Office Visit from 03/30/2022 in [facility] [place] Video Visit from 02/25/2022 in [facility]  [place] Office Visit from 07/31/2020 in [facility]  PHQ-2 Total Score 2 6 2 1   PHQ-9 Total Score 10 23 10 9    DIAGNOSES:    ICD-10-CM   1. Treatment-resistant depression  F32.9     2. Generalized anxiety disorder  F41.1     3. Insomnia due to other mental disorder  F51.05    F99       Receiving Psychotherapy: Yes with Marval Bunde, LCSW  RECOMMENDATIONS:  PDMP was reviewed.  Xanax  filled 02/26/2024.  Lunesta  filled 03/26/2024. I provided approximately  65 minutes of face to face time during this encounter, including time spent before and after the visit in records review, medical decision making, counseling pertinent to today's visit, and charting.   She's doing well on the Spravato so far.  Continue same tx.   Continue Xanax  1 mg, 2 p.o. nightly as needed. Continue Cymbalta   60 mg, 2 every day.  Continue Spravato 84 mg twice a week.  Continue Cerafolin NAC, 1 daily. Continue Lunesta  3 mg, 1 p.o. nightly. Continue hydroxyzine  25 mg, 1 p.o. nightly as needed. Continue  Latuda   80 mg, 1 p.o. q. evening with food. Continue prazosin  3 mg, 1 p.o. nightly.   Continue therapy with Marval Bunde, LCSW. Return in 5 days.  Verneita Cooks, PA-C

## 2024-04-18 ENCOUNTER — Ambulatory Visit (INDEPENDENT_AMBULATORY_CARE_PROVIDER_SITE_OTHER): Admitting: Psychiatry

## 2024-04-18 ENCOUNTER — Encounter: Payer: Self-pay | Admitting: Physician Assistant

## 2024-04-18 DIAGNOSIS — F329 Major depressive disorder, single episode, unspecified: Secondary | ICD-10-CM

## 2024-04-18 NOTE — Progress Notes (Signed)
 [facility]or[facility]rap[facility] Progress Note  Patient ID: Yolanda Wolfe, MRN: [id number],    Date: 04/18/2024  Time Spent: 50 minutes   Treatment Type: Individual Therapy  Reported Symptoms: anxiety, depression, less motivation but improved some   Mental Status Exam:  Appearance:   Neat     Behavior:  Appropriate, Sharing, and Motivated  Motor:  Normal  Speech/Language:   Clear and Coherent  Affect:  Anxious, depressed  Mood:  anxious and depressed  Thought process:  goal directed  Thought content:    WNL  Sensory/Perceptual disturbances:    WNL  Orientation:  oriented to person, place, time/date, situation, day of week, month of year, year, and stated date of Oct. 30, 2025  Attention:  Good  Concentration:  Good  Memory:  WNL  Fund of knowledge:   Good  Insight:    Good  Judgment:   Good  Impulse Control:  Good   Risk Assessment: Danger to Self:  No Self-injurious Behavior: No Danger to Others: No Duty to Warn:no Physical Aggression / Violence:No  Access to Firearms a concern: No  Gang Involvement:No   Subjective:   Patient today in session with symptoms of anxiety and depression and some improvement in motivation but not a lot.  But better than I was 4 wks ago. Trying to counter the negatives, and not let them spiral, but still working on current goals.Did do too much sleeping this weekend. Sunday was better as I was more active and didn't sleep as much. Feels like my tools are more available, the tools I use to get through things. (Like expressing gratitude, especially for  all the things we have and my family is all ok, music is helpful). Looking for more positives versus negatives. Feel like I'm jumping of one track and jumping onto another that is better for her. I do better when busy. Actually feeling good about some of what I do on my job, but not feeling the extreme dread like I have in the past.   Does jump to the negatives still in some  ways---like when I go to be I don't think about how good it will be to get sleep, I think about dreading getting up in the morning. Positives include good thoughts about family, is glad she has a job and benefits, and that daughter and husband enjoy spending time with her. Some improvement in self-negating. Self-doubt decreasing. Slept better last night, after Spravato treatment.    Interventions: Cognitive Behavioral Therapy, Solution-Oriented/Positive Psychology, and Ego-Supportive Identify life conflicts and/or situations including from her past and in the present, that support her current symptomology of anxiety and depression. Develop behavioral and cognitive strategies to reduce or eliminate excessive anxiety and depression.  Identify, challenge, and replace negative self talk with more positive, realistic, and empowering self talk. Patient will develop healthier self talk as a means of feeling better about herself and better managing her anxiety and depression. Patient to increase daily social experiences and work on strengthening a new non-avoidant approach and build self-confidence. Patient to work on developing reality based, positive cognitive messages.   Diagnosis:   ICD-10-CM   1. Treatment-resistant depression  F32.9      Plan: Patient today showing a little more motivation and seems to be a little less depressed which she confirmed, after having started her Spravato treatments recently.  Continues to focus on her anxiety and depression which have decreased a little according to her report.  Seems to enjoy  some activities a little more than usual.  Still having some issues with sleep and hoping that that will improve.  Seems to be a little happier at work.  Does note that she is fortunate to have a job with benefits. Enjoys spending time with family.  Is tolerating some things at work a little better than she was previously.  Also finding some parts of her job that she likes.  Denies any  thoughts of harm to self or others.  Continues working with strategies that helps her with both depression and anxiety, along with trying to see more the positives versus negatives.  Is hopeful that the Spravato medication is going to help her more as she continues to work with her treatment goals.  Goal review and progress/challenges noted with patient.  Next appointment within approximately 3 weeks.   Barnie Bunde, LCSW

## 2024-04-20 ENCOUNTER — Other Ambulatory Visit: Payer: Self-pay | Admitting: Physician Assistant

## 2024-04-22 ENCOUNTER — Ambulatory Visit: Admitting: Physician Assistant

## 2024-04-22 ENCOUNTER — Ambulatory Visit

## 2024-04-22 ENCOUNTER — Encounter: Payer: Self-pay | Admitting: Physician Assistant

## 2024-04-22 VITALS — BP 118/77 | HR 71

## 2024-04-22 DIAGNOSIS — F329 Major depressive disorder, single episode, unspecified: Secondary | ICD-10-CM

## 2024-04-22 DIAGNOSIS — F411 Generalized anxiety disorder: Secondary | ICD-10-CM

## 2024-04-22 DIAGNOSIS — F518 Other sleep disorders not due to a substance or known physiological condition: Secondary | ICD-10-CM

## 2024-04-22 DIAGNOSIS — F5105 Insomnia due to other mental disorder: Secondary | ICD-10-CM

## 2024-04-22 NOTE — Progress Notes (Signed)
 NURSES NOTE:         Pt arrived for her 3rd Spravato Treatment for treatment resistant depression, the starting dose was 56 mg (2 of the 28 mg nasal sprays), pt tolerated that dose well she will receive 84 mg today which will be her maintenance dose. Pt reports she did well after her first treatment and feels good to increase her dose today. Pt sees Verneita Cooks, [place] and she will follow up with her at each treatment. Explained to pt how the treatments would be scheduled and answered any questions she had today. She was given a practice nasal trainer to try a few times before given the one with medication. She verbalized understanding. Pt's Spravato is billed through buy and bill medically.  Spravato medication is stored at treatment center per REMS/FDA guidelines. The medication is required to be locked behind two doors per REMS/FDA protocol. Medication is also disposed of properly after each use per regulations. All documentation for REMS is completed and submitted per FDA/REMS requirements.          Began taking patient's vital signs at 2:05 PM 119/72, pulse 76. SpO2 Sat. 98%. Stable to proceed with treatment. Instructed patient to blow her nose if needed then recline back to a 45 degree angle. Gave patient first dose 28 mg nasal spray, administered in each nostril as directed and observed by nurse, waited 5 more minutes for the second and third dose. After all doses given pt did not complain of any nausea/vomiting, pt did have a drink to help with the taste of Spravato it gives the metallic taste. Assessed her 40 minute vitals, 1:50 PM, 115/76, pulse 74, SpO2 98%. Pt reports she feels the treatment is more intense today but not in a negative way, doing well. Explained she would be monitored for a total time of 120 minutes. Discharge vitals were taken at 4:03 PM 118/77, P 71, SpO2 98%. Verneita came to visit with patient once her thoughts were clearer to discuss how treatment went. Recommend she go home  and sleep or just relax on the couch. No driving, no intense activities. Verbalized understanding. Pt. will be receiving 2 treatments per week for 4 weeks as recommended. Nurse was with pt a total of 60 minutes for clinical assessment. Pt will continue 84 mg for her treatments. Instructed to call with any issues prior to her apts next Monday and Wednesday.   (84 mg)  LOT [id number] EXP AUG 2028

## 2024-04-22 NOTE — Progress Notes (Signed)
 [facility]  Patient ID: Yolanda Wolfe,  MRN: [id number]  PCP: Jordan, Betty G, MD  Date of Evaluation: 04/22/2024 Time spent:60 minutes  Chief Complaint:  Chief Complaint   Depression; Other    HISTORY/CURRENT STATUS: HPI  For Spravato treatment #3.    Didn't have a good weekend, she realizes she needs to plan something so she'll have a reason to get out of the house. Felt down and unmotivated.  Other than that, is doing ok.  It's still early in the Spravato treatment to gauge its efficacy.  Work is ok.   No extreme sadness, tearfulness, or feelings of hopelessness.  Sleeps ok.  ADLs and personal hygiene are normal.   Denies any changes in concentration, making decisions, or remembering things.  Appetite has not changed.  Weight is stable.  Anxiety is well-controlled.  Xanax  helps when needed.  No SI/HI.  Individual Medical History/ Review of Systems: Changes? :No     Past medications for mental health diagnoses include: Amitriptylline for sleep? Celexa,  Zoloft , Cymbalta , Wellbutrin  caused migraines to worsen,  Lunesta , Ativan , Belsomra , Temazapam,  trazodone  not effective at low doses but at 100 mg she was too groggy, Mirtazapine  caused extreme wt gain, Sonata  ineffective, Ambien   Topamax  for migraines, somewhat effective  Allergies: Wellbutrin  [bupropion ]  Current Medications:  Current Outpatient Medications:    ALPRAZolam  (XANAX ) 1 MG tablet, Take 2 tablets (2 mg total) by mouth at bedtime., Disp: 60 tablet, Rfl: 5   Atogepant  (QULIPTA ) 60 MG TABS, Take 1 tablet (60 mg total) by mouth daily., Disp: 30 tablet, Rfl: 11   botulinum toxin Type A  (BOTOX ) 200 units injection, Inject 155 units IM into multiple site in the face,neck and head once every 90 days, Disp: 1 each, Rfl: 4   DULoxetine  (CYMBALTA ) 60 MG capsule, Take 2 capsules (120 mg total) by mouth daily., Disp: 180 capsule, Rfl: 1   Esketamine HCl, 84 MG Dose, (SPRAVATO, 84 MG DOSE,) 28 MG/DEVICE  SOPK, Place 84 mg into the nose every 3 (three) days., Disp: , Rfl:    estradiol (LYLLANA) 0.0375 MG/24HR, Apply 1 patch twice a week by transdermal route., Disp: , Rfl:    Eszopiclone  3 MG TABS, Take 1 tablet (3 mg total) by mouth at bedtime as needed., Disp: 30 tablet, Rfl: 5   hydrOXYzine  (ATARAX ) 25 MG tablet, TAKE 1 TABLET FOR SLEEP AS NEEDED, Disp: 90 tablet, Rfl: 1   lurasidone  (LATUDA ) 80 MG TABS tablet, Take 1 tablet (80 mg total) by mouth daily with breakfast., Disp: 90 tablet, Rfl: 1   Methylfol-Algae-B12-Acetylcyst (CEREFOLIN NAC) 6-90.314-2-600 MG TABS, Take 1 tablet by mouth daily., Disp: 30 tablet, Rfl: 11   NURTEC 75 MG TBDP, TAKE 1 TABLET (75 MG TOTAL) BY MOUTH DAILY AS NEEDED, Disp: 16 tablet, Rfl: 5   progesterone (PROMETRIUM) 100 MG capsule, Take 100 mg by mouth daily., Disp: , Rfl:    Tirzepatide (MOUNJARO [place]), Inject into the skin., Disp: , Rfl:    tretinoin (RETIN-A) 0.1 % cream, , Disp: , Rfl:    acetaZOLAMIDE  (DIAMOX ) 250 MG tablet, TAKE 1 TABLET BY MOUTH TWICE A DAY (Patient not taking: Reported on 02/27/2024), Disp: 180 tablet, Rfl: 1   prazosin  (MINIPRESS ) 1 MG capsule, TAKE 3 CAPSULES (3 MG TOTAL) BY MOUTH AT BEDTIME, Disp: 90 capsule, Rfl: 1 Medication Side Effects: H/A w/ Wellbutrin , Sexual dysfunction with Cymbalta    Family Medical/ Social History: Changes? no  MENTAL HEALTH EXAM:  There were no vitals taken for  this visit.There is no height or weight on file to calculate BMI.  General Appearance: Casual and Well Groomed  Eye Contact:  Good  Speech:  Clear and Coherent and Normal Rate  Volume:  Normal  Mood:  Euthymic  Affect:  Congruent  Thought Process:  Goal Directed and Descriptions of Associations: Circumstantial  Orientation:  Full (Time, Place, and Person)  Thought Content: Logical   Suicidal Thoughts:  No  Homicidal Thoughts:  No  Memory:  WNL  Judgement:  Good  Insight:  Good  Psychomotor Activity:  Normal  Concentration:  Concentration: Good  and Attention Span: Good  Recall:  Good  Fund of Knowledge: Good  Language: Good  Assets:  Communication Skills Desire for Improvement Financial Resources/Insurance Housing Resilience Transportation Vocational/Educational  ADL's:  Intact  Cognition: WNL  Prognosis:  Good   Dr. Jordan is following labs.   GeneSight test results are in chart under Labs from 08/05/2020, had moderately reduced folic acid conversion  Patient was administered Spravato 84 mg intranasally today.  The patient experienced the typical dissociation which gradually resolved over the 2-hour period of observation.  There were no complications.  Specifically the patient did not have nausea or vomiting or headache.  Ox and blood pressures remained within normal ranges at the 40-minute and 2-hour follow-up intervals.  By the time the 2-hour observation period was met the patient was alert and oriented and able to exit without assistance.  See nursing note for further details.  ECT-MADRS    Flowsheet Row Office Visit from 02/27/2024 in [facility] Office Visit from 10/20/2023 in [facility] Office Visit from 08/31/2023 in [facility]  MADRS Total Score 34 17 38   GAD-7    Flowsheet Row Office Visit from 07/25/2022 in [facility] [place] [facility] [place] Office Visit from 07/31/2020 in [facility]  Total GAD-7 Score 5 13   PHQ2-9    Flowsheet Row Office Visit from 07/25/2022 in [facility] [place] [facility] [place] Office Visit from 03/30/2022 in [facility] [place] [facility] [place] Video Visit from 02/25/2022 in [facility] [place] [facility] [place] Office Visit from 07/31/2020 in [facility]  PHQ-2 Total Score 2 6 2 1   PHQ-9 Total Score 10 23 10 9    DIAGNOSES:    ICD-10-CM   1. Treatment-resistant depression  F32.9     2. Generalized anxiety disorder   F41.1     3. Insomnia due to other mental disorder  F51.05    F99     4. Abnormal dreams  F51.8       Receiving Psychotherapy: Yes with Marval Bunde, LCSW  RECOMMENDATIONS:  PDMP was reviewed.  Xanax  filled 02/26/2024.  Lunesta  filled 03/26/2024. I provided approximately  60 minutes of face to face time during this encounter, including time spent before and after the visit in records review, medical decision making, counseling pertinent to today's visit, and charting.   She's tolerating the Spravato well so continue the same.    Continue Xanax  1 mg, 2 p.o. nightly as needed. Continue Cymbalta   60 mg, 2 every day.  Continue Spravato 84 mg twice a week. Continue Cerafolin NAC, 1 daily. Continue Lunesta  3 mg, 1 p.o. nightly. Continue hydroxyzine  25 mg, 1 p.o. nightly as needed. Continue  Latuda   80 mg, 1 p.o. q. evening with food. Continue prazosin  3 mg, 1 p.o. nightly.   Continue therapy with Marval Bunde,  LCSW. Return in 2 days.  Verneita Cooks, PA-C

## 2024-04-24 ENCOUNTER — Ambulatory Visit: Admitting: Physician Assistant

## 2024-04-24 ENCOUNTER — Ambulatory Visit

## 2024-04-24 ENCOUNTER — Encounter: Payer: Self-pay | Admitting: Physician Assistant

## 2024-04-24 VITALS — BP 125/78 | HR 71

## 2024-04-24 DIAGNOSIS — F329 Major depressive disorder, single episode, unspecified: Secondary | ICD-10-CM | POA: Diagnosis not present

## 2024-04-24 DIAGNOSIS — F5105 Insomnia due to other mental disorder: Secondary | ICD-10-CM

## 2024-04-24 DIAGNOSIS — F411 Generalized anxiety disorder: Secondary | ICD-10-CM

## 2024-04-24 NOTE — Progress Notes (Signed)
 NURSES NOTE:         Pt arrived for her 4th Spravato Treatment for treatment resistant depression, the starting dose was 56 mg (2 of the 28 mg nasal sprays), pt tolerated that dose well she will receive 84 mg today which will be her maintenance dose. Pt reports she did well after her first treatment and feels good to increase her dose today. Pt sees Verneita Cooks, [place] and she will follow up with her at each treatment. Explained to pt how the treatments would be scheduled and answered any questions she had today. She was given a practice nasal trainer to try a few times before given the one with medication. She verbalized understanding. Pt's Spravato is billed through buy and bill medically.  Spravato medication is stored at treatment center per REMS/FDA guidelines. The medication is required to be locked behind two doors per REMS/FDA protocol. Medication is also disposed of properly after each use per regulations. All documentation for REMS is completed and submitted per FDA/REMS requirements.          Began taking patient's vital signs at 2:05 PM 108/74, pulse 74. SpO2 Sat. 99%. Pt reports not having a good day today and didn't go to work. Stable to proceed with treatment. Instructed patient to blow her nose if needed then recline back to a 45 degree angle. Gave patient first dose 28 mg nasal spray, administered in each nostril as directed and observed by nurse, waited 5 more minutes for the second and third dose. After all doses given pt did not complain of any nausea/vomiting, pt did have a drink to help with the taste of Spravato it gives the metallic taste. Assessed her 40 minute vitals, 2:46 PM, 116/74, pulse 70, SpO2 98%. Pt reports she isn't feeling so intense reaction today from her Spravato. Explained she would be monitored for a total time of 120 minutes. Discharge vitals were taken at 3:56 PM 125/78, P 71, SpO2 98%. Verneita came to visit with patient once her thoughts were clearer to discuss how  treatment went. Recommend she go home and sleep or just relax on the couch. No driving, no intense activities. Verbalized understanding. Pt. will be receiving 2 treatments per week for 4 weeks as recommended. Nurse was with pt a total of 60 minutes for clinical assessment. Pt will continue 84 mg for her treatments. Instructed to call with any issues prior to her apts next Monday and Wednesday.   (84 mg)  LOT [id number] EXP MAR 2028

## 2024-04-24 NOTE — Progress Notes (Signed)
 [facility]  Patient ID: Yolanda Wolfe,  MRN: [id number]  PCP: Jordan, Betty G, MD  Date of Evaluation: 04/24/2024 Time spent:65 minutes  Chief Complaint:  Chief Complaint   Depression; Other    HISTORY/CURRENT STATUS: HPI  For Spravato treatment #4.    She's had a hard day.  Didn't go to work b/c everything felt heavy and gray. She hasn't had an episode that bad in a couple of weeks.  Energy and motivation are better overall.  Sleeps ok.  Without the Xanax  and Lunesta , she has trouble falling asleep. ADLs and personal hygiene are normal.   Denies any changes in concentration, making decisions, or remembering things.  Appetite has not changed.  No PA. Gets anxious/overwhelmed at times.  Usually doesn't need the Xanax  during the day.  No mania, delirium, AH/VH.  No SI/HI.  Individual Medical History/ Review of Systems: Changes? :No     Past medications for mental health diagnoses include: Amitriptylline for sleep? Celexa,  Zoloft , Cymbalta , Wellbutrin  caused migraines to worsen,  Lunesta , Ativan , Belsomra , Temazapam,  trazodone  not effective at low doses but at 100 mg she was too groggy, Mirtazapine  caused extreme wt gain, Sonata  ineffective, Ambien   Topamax  for migraines, somewhat effective  Allergies: Wellbutrin  [bupropion ]  Current Medications:  Current Outpatient Medications:    acetaZOLAMIDE  (DIAMOX ) 250 MG tablet, TAKE 1 TABLET BY MOUTH TWICE A DAY (Patient not taking: Reported on 02/27/2024), Disp: 180 tablet, Rfl: 1   ALPRAZolam  (XANAX ) 1 MG tablet, Take 2 tablets (2 mg total) by mouth at bedtime., Disp: 60 tablet, Rfl: 5   Atogepant  (QULIPTA ) 60 MG TABS, Take 1 tablet (60 mg total) by mouth daily., Disp: 30 tablet, Rfl: 11   botulinum toxin Type A  (BOTOX ) 200 units injection, Inject 155 units IM into multiple site in the face,neck and head once every 90 days, Disp: 1 each, Rfl: 4   DULoxetine  (CYMBALTA ) 60 MG capsule, Take 2 capsules (120 mg total) by  mouth daily., Disp: 180 capsule, Rfl: 1   Esketamine HCl, 84 MG Dose, (SPRAVATO, 84 MG DOSE,) 28 MG/DEVICE SOPK, Place 84 mg into the nose every 3 (three) days., Disp: , Rfl:    estradiol (LYLLANA) 0.0375 MG/24HR, Apply 1 patch twice a week by transdermal route., Disp: , Rfl:    Eszopiclone  3 MG TABS, Take 1 tablet (3 mg total) by mouth at bedtime as needed., Disp: 30 tablet, Rfl: 5   hydrOXYzine  (ATARAX ) 25 MG tablet, TAKE 1 TABLET FOR SLEEP AS NEEDED, Disp: 90 tablet, Rfl: 1   lurasidone  (LATUDA ) 80 MG TABS tablet, Take 1 tablet (80 mg total) by mouth daily with breakfast., Disp: 90 tablet, Rfl: 1   Methylfol-Algae-B12-Acetylcyst (CEREFOLIN NAC) 6-90.314-2-600 MG TABS, Take 1 tablet by mouth daily., Disp: 30 tablet, Rfl: 11   NURTEC 75 MG TBDP, TAKE 1 TABLET (75 MG TOTAL) BY MOUTH DAILY AS NEEDED, Disp: 16 tablet, Rfl: 5   prazosin  (MINIPRESS ) 1 MG capsule, TAKE 3 CAPSULES (3 MG TOTAL) BY MOUTH AT BEDTIME, Disp: 90 capsule, Rfl: 1   progesterone (PROMETRIUM) 100 MG capsule, Take 100 mg by mouth daily., Disp: , Rfl:    Tirzepatide (MOUNJARO [place]), Inject into the skin., Disp: , Rfl:    tretinoin (RETIN-A) 0.1 % cream, , Disp: , Rfl:  Medication Side Effects: H/A w/ Wellbutrin , Sexual dysfunction with Cymbalta    Family Medical/ Social History: Changes? no  MENTAL HEALTH EXAM:  There were no vitals taken for this visit.There is no height or weight  on file to calculate BMI.  General Appearance: Casual and Well Groomed  Eye Contact:  Good  Speech:  Clear and Coherent and Normal Rate  Volume:  Normal  Mood:  Euthymic  Affect:  Congruent  Thought Process:  Goal Directed and Descriptions of Associations: Circumstantial  Orientation:  Full (Time, Place, and Person)  Thought Content: Logical   Suicidal Thoughts:  No  Homicidal Thoughts:  No  Memory:  WNL  Judgement:  Good  Insight:  Good  Psychomotor Activity:  Normal  Concentration:  Concentration: Good and Attention Span: Good  Recall:   Good  Fund of Knowledge: Good  Language: Good  Assets:  Communication Skills Desire for Improvement Financial Resources/Insurance Housing Resilience Transportation Vocational/Educational  ADL's:  Intact  Cognition: WNL  Prognosis:  Good   Dr. Jordan is following labs.   GeneSight test results are in chart under Labs from 08/05/2020, had moderately reduced folic acid conversion  Patient was administered Spravato 84 mg intranasally today.  The patient experienced the typical dissociation which gradually resolved over the 2-hour period of observation.  There were no complications.  Specifically the patient did not have nausea or vomiting or headache.  Blood pressures and pulse ox levels remained within normal ranges at the 40-minute and 2-hour follow-up intervals.  By the time the 2-hour observation period was met the patient was alert and oriented and able to exit without assistance.  Patient feels the Spravato administration is helpful for the treatment resistant depression and would like to continue the treatment.  See nursing note for further details.  ECT-MADRS    Flowsheet Row Office Visit from 02/27/2024 in [facility] Office Visit from 10/20/2023 in [facility] Office Visit from 08/31/2023 in [facility]  MADRS Total Score 34 17 38   GAD-7    Flowsheet Row Office Visit from 07/25/2022 in [facility] [place] [facility] [place] Office Visit from 07/31/2020 in [facility]  Total GAD-7 Score 5 13   PHQ2-9    Flowsheet Row Office Visit from 07/25/2022 in [facility] [place] [facility] [place] Office Visit from 03/30/2022 in [facility] [place] [facility] [place] Video Visit from 02/25/2022 in [facility] [place] [facility] [place] Office Visit from 07/31/2020 in [facility]  PHQ-2 Total Score 2 6 2 1   PHQ-9 Total Score 10 23 10 9     DIAGNOSES:    ICD-10-CM   1. Treatment-resistant depression  F32.9     2. Generalized anxiety disorder  F41.1     3. Insomnia due to other mental disorder  F51.05    F99        Receiving Psychotherapy: Yes with Marval Bunde, LCSW  RECOMMENDATIONS:  PDMP was reviewed.  Xanax  filled 02/26/2024.  Lunesta  filled 03/26/2024. I provided approximately  65 minutes of face to face time during this encounter, including time spent before and after the visit in records review, medical decision making, counseling pertinent to today's visit, and charting.   We discussed the depression and how she felt earlier today. It's a good sign that she hasn't had an episode this intense in a few weeks, I think the Spravato is starting to work.  She should continue to improve as time goes by.    Continue Xanax  1 mg, 2 p.o. nightly as needed. Continue Cymbalta   60 mg, 2 every day.  Continue Spravato 84 mg twice a week. Continue Cerafolin NAC, 1 daily.  Continue Lunesta  3 mg, 1 p.o. nightly. Continue hydroxyzine  25 mg, 1 p.o. nightly as needed. Continue  Latuda   80 mg, 1 p.o. q. evening with food. Continue prazosin  3 mg, 1 p.o. nightly.   Continue therapy with Marval Bunde, LCSW. Return in 5 days.  Verneita Cooks, PA-C

## 2024-04-25 ENCOUNTER — Other Ambulatory Visit: Payer: Self-pay | Admitting: Neurology

## 2024-04-29 ENCOUNTER — Ambulatory Visit

## 2024-04-29 ENCOUNTER — Encounter: Payer: Self-pay | Admitting: Physician Assistant

## 2024-04-29 ENCOUNTER — Ambulatory Visit: Admitting: Physician Assistant

## 2024-04-29 VITALS — BP 108/68 | HR 70

## 2024-04-29 DIAGNOSIS — F5105 Insomnia due to other mental disorder: Secondary | ICD-10-CM

## 2024-04-29 DIAGNOSIS — F329 Major depressive disorder, single episode, unspecified: Secondary | ICD-10-CM

## 2024-04-29 DIAGNOSIS — F411 Generalized anxiety disorder: Secondary | ICD-10-CM

## 2024-04-29 NOTE — Progress Notes (Signed)
 [facility]  Patient ID: Yolanda Wolfe,  MRN: [id number]  PCP: Jordan, Betty G, MD  Date of Evaluation: 04/29/2024 Time spent:60 minutes  Chief Complaint:  Chief Complaint   Depression; Other    HISTORY/CURRENT STATUS: HPI  For Spravato treatment #5   Goldia took her daughter to [employer] on Saturday for open house.  They had a good time.  Sunday, she stayed in bed all day.  Slept a lot.  Felt really tired and drained.  That is pretty much the way it is every weekend if she does not have any plans.  She still has a lot of problems getting up and doing things.  For example she really dreaded going to work this morning but once she got there, she had a good day.  No feelings of hopelessness.  Sleeps fairly well.  No nightmares.  She is seeing sleep management later this week.  It will be her second visit with them.  ADLs and personal hygiene are normal.   Denies any changes in concentration, making decisions, or remembering things.  Appetite has not changed.  Weight is stable.  Anxiety is controlled.  No mania, delirium, AH/VH.  No SI/HI.  Individual Medical History/ Review of Systems: Changes? :No     Past medications for mental health diagnoses include: Amitriptylline for sleep? Celexa,  Zoloft , Cymbalta , Wellbutrin  caused migraines to worsen,  Lunesta , Ativan , Belsomra , Temazapam,  trazodone  not effective at low doses but at 100 mg she was too groggy, Mirtazapine  caused extreme wt gain, Sonata  ineffective, Ambien   Topamax  for migraines, somewhat effective  Allergies: Wellbutrin  [bupropion ]  Current Medications:  Current Outpatient Medications:    ALPRAZolam  (XANAX ) 1 MG tablet, Take 2 tablets (2 mg total) by mouth at bedtime., Disp: 60 tablet, Rfl: 5   Atogepant  (QULIPTA ) 60 MG TABS, TAKE 1 TABLET BY MOUTH EVERY DAY, Disp: 30 tablet, Rfl: 0   botulinum toxin Type A  (BOTOX ) 200 units injection, Inject 155 units IM into multiple site in the face,neck and head  once every 90 days, Disp: 1 each, Rfl: 4   DULoxetine  (CYMBALTA ) 60 MG capsule, Take 2 capsules (120 mg total) by mouth daily., Disp: 180 capsule, Rfl: 1   Esketamine HCl, 84 MG Dose, (SPRAVATO, 84 MG DOSE,) 28 MG/DEVICE SOPK, Place 84 mg into the nose every 3 (three) days., Disp: , Rfl:    estradiol (LYLLANA) 0.0375 MG/24HR, Apply 1 patch twice a week by transdermal route., Disp: , Rfl:    Eszopiclone  3 MG TABS, Take 1 tablet (3 mg total) by mouth at bedtime as needed., Disp: 30 tablet, Rfl: 5   hydrOXYzine  (ATARAX ) 25 MG tablet, TAKE 1 TABLET FOR SLEEP AS NEEDED, Disp: 90 tablet, Rfl: 1   lurasidone  (LATUDA ) 80 MG TABS tablet, Take 1 tablet (80 mg total) by mouth daily with breakfast., Disp: 90 tablet, Rfl: 1   Methylfol-Algae-B12-Acetylcyst (CEREFOLIN NAC) 6-90.314-2-600 MG TABS, Take 1 tablet by mouth daily., Disp: 30 tablet, Rfl: 11   NURTEC 75 MG TBDP, TAKE 1 TABLET (75 MG TOTAL) BY MOUTH DAILY AS NEEDED, Disp: 16 tablet, Rfl: 5   prazosin  (MINIPRESS ) 1 MG capsule, TAKE 3 CAPSULES (3 MG TOTAL) BY MOUTH AT BEDTIME, Disp: 90 capsule, Rfl: 1   progesterone (PROMETRIUM) 100 MG capsule, Take 100 mg by mouth daily., Disp: , Rfl:    Tirzepatide (MOUNJARO [place]), Inject into the skin., Disp: , Rfl:    tretinoin (RETIN-A) 0.1 % cream, , Disp: , Rfl:    acetaZOLAMIDE  (  DIAMOX ) 250 MG tablet, TAKE 1 TABLET BY MOUTH TWICE A DAY (Patient not taking: Reported on 02/27/2024), Disp: 180 tablet, Rfl: 1 Medication Side Effects: H/A w/ Wellbutrin , Sexual dysfunction with Cymbalta    Family Medical/ Social History: Changes? no  MENTAL HEALTH EXAM:  There were no vitals taken for this visit.There is no height or weight on file to calculate BMI.  General Appearance: Casual and Well Groomed  Eye Contact:  Good  Speech:  Clear and Coherent and Normal Rate  Volume:  Normal  Mood:  Euthymic  Affect:  Congruent  Thought Process:  Goal Directed and Descriptions of Associations: Circumstantial  Orientation:  Full  (Time, Place, and Person)  Thought Content: Logical   Suicidal Thoughts:  No  Homicidal Thoughts:  No  Memory:  WNL  Judgement:  Good  Insight:  Good  Psychomotor Activity:  Normal  Concentration:  Concentration: Good and Attention Span: Good  Recall:  Good  Fund of Knowledge: Good  Language: Good  Assets:  Communication Skills Desire for Improvement Financial Resources/Insurance Housing Resilience Social Support Transportation Vocational/Educational  ADL's:  Intact  Cognition: WNL  Prognosis:  Good   Dr. Jordan is following labs.   GeneSight test results are in chart under Labs from 08/05/2020, had moderately reduced folic acid conversion  Patient was administered Spravato 84 mg intranasally today.  The patient experienced the typical dissociation which gradually resolved over the 2-hour period of observation.  There were no complications.  Specifically the patient did not have nausea or vomiting or headache.  Blood pressures and pulse ox levels remained within normal ranges at the 40-minute and 2-hour follow-up intervals.  By the time the 2-hour observation period was met the patient was alert and oriented and able to exit without assistance.  Patient feels the Spravato administration is helpful for the treatment resistant depression and would like to continue the treatment.  See nursing note for further details.  ECT-MADRS    Flowsheet Row Office Visit from 02/27/2024 in [facility] Office Visit from 10/20/2023 in [facility] Office Visit from 08/31/20[facility] in [facility]  MADRS Total Score 34 17 38   GAD-7    Flowsheet Row Office Visit from 07/25/2022 in [facility] [place] Office Visit from 07/31/2020 in [facility]  Total GAD-7 Score 5 13   PHQ2-9    Flowsheet Row Office Visit from 07/25/2022 in [facility] [place] [facility] [place]  Office Visit from 03/30/2022 in [facility] [place] Video Visit from 02/25/2022 in [facility] [place] Office Visit from 07/31/2020 in [facility]  PHQ-2 Total Score 2 6 2 1   PHQ-9 Total Score 10 23 10 9    DIAGNOSES:    ICD-10-CM   1. Treatment-resistant depression  F32.9     2. Generalized anxiety disorder  F41.1     3. Insomnia due to other mental disorder  F51.05    F99      Receiving Psychotherapy: Yes with Marval Bunde, LCSW  RECOMMENDATIONS:  PDMP was reviewed.  Xanax  filled 02/26/2024.  Lunesta  filled 04/25/2024. I provided approximately  60 minutes of face to face time during this encounter, including time spent before and after the visit in records review, medical decision making, counseling pertinent to today's visit, and charting.   This is the beginning of her third week of Spravato.  She still lacks motivation but it is  early in the course of treatment to tell how effective it is. I may consider adding modafinil or another stimulant to help with energy and motivation.  Will discuss further at the next visit and also see what transpires at the sleep management appointment.  Continue Xanax  1 mg, 2 p.o. nightly as needed. Continue Cymbalta   60 mg, 2 every day.  Continue Spravato 84 mg twice a week. Continue Cerafolin NAC, 1 daily. Continue Lunesta  3 mg, 1 p.o. nightly. Continue hydroxyzine  25 mg, 1 p.o. nightly as needed. Continue  Latuda   80 mg, 1 p.o. q. evening with food. Continue prazosin  3 mg, 1 p.o. nightly.   Continue therapy with Marval Bunde, LCSW. Return in 2 days.  Verneita Cooks, PA-C

## 2024-04-29 NOTE — Progress Notes (Signed)
 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 Pt reports she did well after her first treatment and feels good to increase her dose today. Pt sees Verneita Cooks, [place] and she will follow up with her at each treatment. Explained to pt how the treatments would be scheduled and answered any questions she had today. She was given a practice nasal trainer to try a few times before given the one with medication. She verbalized understanding. Pt's Spravato is billed through buy and bill medically.  Spravato medication is stored at treatment center per REMS/FDA guidelines. The medication is required to be locked behind two doors per REMS/FDA protocol. Medication is also disposed of properly after each use per regulations. All documentation for REMS is completed and submitted per FDA/REMS requirements.          Began taking patient's vital signs at 2:08 PM 121/70, pulse 76. SpO2 Sat. 99%. Stable to proceed with treatment. Instructed patient to blow her nose if needed then recline back to a 45 degree angle. Gave patient first dose 28 mg nasal spray, administered in each nostril as directed and observed by nurse, waited 5 more minutes for the second and third dose. After all doses given pt did not complain of any nausea/vomiting, pt did have a drink to help with the taste of Spravato it gives the metallic taste. Assessed her 40 minute vitals, 2:55 PM, 123/72, pulse 73, SpO2 99%. Pt reports today's treatment is very strong and intense. Pt needed to go to the bathroom once during her treatment.  Explained she would be monitored for a total time of 120 minutes. Discharge vitals were taken at 4:08 PM 108/68, P 70, SpO2 97%. Verneita came to visit with patient once her thoughts were clearer to discuss how treatment went.  Recommend she go home and sleep or just relax on the couch. No driving, no intense activities. Verbalized understanding. Pt. will be receiving 2 treatments per week for 4 weeks as recommended. Nurse was with pt a total of 60 minutes for clinical assessment. Pt will continue 84 mg for her treatments. Instructed to call with any issues prior to her apts next Monday and Wednesday.   (84 mg)  LOT [id number] EXP MAR 2028

## 2024-04-30 ENCOUNTER — Ambulatory Visit: Admitting: Psychiatry

## 2024-04-30 ENCOUNTER — Ambulatory Visit: Admitting: Internal Medicine

## 2024-05-01 ENCOUNTER — Other Ambulatory Visit: Payer: Self-pay

## 2024-05-01 ENCOUNTER — Ambulatory Visit

## 2024-05-01 ENCOUNTER — Ambulatory Visit (INDEPENDENT_AMBULATORY_CARE_PROVIDER_SITE_OTHER): Admitting: Physician Assistant

## 2024-05-01 ENCOUNTER — Other Ambulatory Visit: Payer: Self-pay | Admitting: Neurology

## 2024-05-01 ENCOUNTER — Other Ambulatory Visit (HOSPITAL_COMMUNITY): Payer: Self-pay

## 2024-05-01 ENCOUNTER — Encounter: Payer: Self-pay | Admitting: Physician Assistant

## 2024-05-01 VITALS — BP 124/77 | HR 68

## 2024-05-01 DIAGNOSIS — F329 Major depressive disorder, single episode, unspecified: Secondary | ICD-10-CM

## 2024-05-01 DIAGNOSIS — F5105 Insomnia due to other mental disorder: Secondary | ICD-10-CM

## 2024-05-01 DIAGNOSIS — F411 Generalized anxiety disorder: Secondary | ICD-10-CM

## 2024-05-01 NOTE — Progress Notes (Signed)
 [facility]  Patient ID: Yolanda Wolfe,  MRN: [id number]  PCP: Jordan, Betty G, MD  Date of Evaluation: 05/01/2024 Time spent:65 minutes  Chief Complaint:  Chief Complaint   Depression; Other    HISTORY/CURRENT STATUS: HPI  For Spravato treatment #6  She is tolerating the Spravato well.  She still has not felt a wow moment where she feels that the depression is a lot better but she is stable.  Considers that a good thing.  She still dreads going to work but once she gets there she is fine. Sleeps okay with her current meds.  ADLs and personal hygiene are normal.   No change in memory.  Appetite has not changed.  Weight is stable.  Anxiety is controlled.  No mania, delirium, AH/VH.  No SI/HI.  Individual Medical History/ Review of Systems: Changes? :No     Past medications for mental health diagnoses include: Amitriptylline for sleep? Celexa,  Zoloft , Cymbalta , Wellbutrin  caused migraines to worsen,  Lunesta , Ativan , Belsomra , Temazapam,  trazodone  not effective at low doses but at 100 mg she was too groggy, Mirtazapine  caused extreme wt gain, Sonata  ineffective, Ambien   Topamax  for migraines, somewhat effective  Allergies: Wellbutrin  [bupropion ]  Current Medications:  Current Outpatient Medications:    ALPRAZolam  (XANAX ) 1 MG tablet, Take 2 tablets (2 mg total) by mouth at bedtime., Disp: 60 tablet, Rfl: 5   Atogepant  (QULIPTA ) 60 MG TABS, TAKE 1 TABLET BY MOUTH EVERY DAY, Disp: 30 tablet, Rfl: 0   botulinum toxin Type A  (BOTOX ) 200 units injection, Inject 155 units IM into multiple site in the face,neck and head once every 90 days, Disp: 1 each, Rfl: 4   DULoxetine  (CYMBALTA ) 60 MG capsule, Take 2 capsules (120 mg total) by mouth daily., Disp: 180 capsule, Rfl: 1   Esketamine HCl, 84 MG Dose, (SPRAVATO, 84 MG DOSE,) 28 MG/DEVICE SOPK, Place 84 mg into the nose every 3 (three) days., Disp: , Rfl:    estradiol (LYLLANA) 0.0375 MG/24HR, Apply 1 patch twice  a week by transdermal route., Disp: , Rfl:    Eszopiclone  3 MG TABS, Take 1 tablet (3 mg total) by mouth at bedtime as needed., Disp: 30 tablet, Rfl: 5   hydrOXYzine  (ATARAX ) 25 MG tablet, TAKE 1 TABLET FOR SLEEP AS NEEDED, Disp: 90 tablet, Rfl: 1   lurasidone  (LATUDA ) 80 MG TABS tablet, Take 1 tablet (80 mg total) by mouth daily with breakfast., Disp: 90 tablet, Rfl: 1   Methylfol-Algae-B12-Acetylcyst (CEREFOLIN NAC) 6-90.314-2-600 MG TABS, Take 1 tablet by mouth daily., Disp: 30 tablet, Rfl: 11   NURTEC 75 MG TBDP, TAKE 1 TABLET (75 MG TOTAL) BY MOUTH DAILY AS NEEDED, Disp: 16 tablet, Rfl: 5   prazosin  (MINIPRESS ) 1 MG capsule, TAKE 3 CAPSULES (3 MG TOTAL) BY MOUTH AT BEDTIME, Disp: 90 capsule, Rfl: 1   progesterone (PROMETRIUM) 100 MG capsule, Take 100 mg by mouth daily., Disp: , Rfl:    Tirzepatide (MOUNJARO [place]), Inject into the skin., Disp: , Rfl:    tretinoin (RETIN-A) 0.1 % cream, , Disp: , Rfl:    acetaZOLAMIDE  (DIAMOX ) 250 MG tablet, TAKE 1 TABLET BY MOUTH TWICE A DAY (Patient not taking: Reported on 02/27/2024), Disp: 180 tablet, Rfl: 1 Medication Side Effects: H/A w/ Wellbutrin , Sexual dysfunction with Cymbalta    Family Medical/ Social History: Changes? no  MENTAL HEALTH EXAM:  There were no vitals taken for this visit.There is no height or weight on file to calculate BMI.  General Appearance: Casual and  Well Groomed  Eye Contact:  Good  Speech:  Clear and Coherent and Normal Rate  Volume:  Normal  Mood:  Euthymic  Affect:  Congruent  Thought Process:  Goal Directed and Descriptions of Associations: Circumstantial  Orientation:  Full (Time, Place, and Person)  Thought Content: Logical   Suicidal Thoughts:  No  Homicidal Thoughts:  No  Memory:  WNL  Judgement:  Good  Insight:  Good  Psychomotor Activity:  Normal  Concentration:  Concentration: Good and Attention Span: Good  Recall:  Good  Fund of Knowledge: Good  Language: Good  Assets:  Communication Skills Desire for  Improvement Financial Resources/Insurance Housing Physical Health Resilience Social Support Transportation Vocational/Educational  ADL's:  Intact  Cognition: WNL  Prognosis:  Good   Dr. Jordan is following labs.   GeneSight test results are in chart under Labs from 08/05/2020, had moderately reduced folic acid conversion  Patient was administered Spravato 84 mg intranasally today.  The patient experienced the typical dissociation which gradually resolved over the 2-hour period of observation.  There were no complications.  Specifically the patient did not have nausea or vomiting or headache.  Blood pressures and pulse ox levels remained within normal ranges at the 40-minute and 2-hour follow-up intervals.  By the time the 2-hour observation period was met the patient was alert and oriented and able to exit without assistance.  See nursing note for further details.  ECT-MADRS    Flowsheet Row [facility] Visit from 02/27/2024 in [facility] Office Visit from 10/20/2023 in [facility] Office Visit from 08/31/2023 in [facility]  MADRS Total Score 34 17 38   GAD-7    Flowsheet Row Office Visit from 07/25/2022 in [facility] [place] [place] Office Visit from 07/31/2020 in [facility]  Total GAD-7 Score 5 13   PHQ2-9    Flowsheet Row Office Visit from 07/25/2022 in [facility] [place] Office Visit from 03/30/2022 in [facility] [place] Video Visit from 02/25/2022 in [facility] [place] Office Visit from 07/31/2020 in [facility]  PHQ-2 Total Score 2 6 2 1   PHQ-9 Total Score 10 23 10 9    DIAGNOSES:    ICD-10-CM   1. Treatment-resistant depression  F32.9     2. Generalized anxiety disorder  F41.1     3. Insomnia due to other mental disorder  F51.05    F99        Receiving Psychotherapy: Yes with Marval Bunde, LCSW  RECOMMENDATIONS:  PDMP was reviewed.  Xanax  filled 02/26/2024.  Lunesta  filled 04/25/2024. I provided approximately  65 minutes of face to face time during this encounter, including time spent before and after the visit in records review, medical decision making, counseling pertinent to today's visit, and charting.   She is tolerating the Spravato treatments well.  I see it is a good sign that the depression has not worsened, and I think is slightly improving since being on the Spravato.  This is the end of her third week and I think she will respond to even more in the coming weeks.  She would like to continue treatment.  Continue Xanax  1 mg, 2 p.o. nightly as needed. Continue Cymbalta   60 mg, 2 every day.  Continue Spravato 84 mg twice a week. Continue Cerafolin NAC, 1 daily. Continue Lunesta  3 mg, 1 p.o. nightly. Continue hydroxyzine  25 mg, 1  p.o. nightly as needed. Continue  Latuda   80 mg, 1 p.o. q. evening with food. Continue prazosin  3 mg, 1 p.o. nightly.   Continue therapy with Marval Bunde, LCSW. Return in 5 days.  Verneita Cooks, PA-C

## 2024-05-01 NOTE — Progress Notes (Signed)
 NURSES NOTE:         Pt arrived for her 6 th Spravato Treatment for treatment resistant depression, the starting dose was 56 mg (2 of the 28 mg nasal sprays), pt tolerated that dose well she will receive 84 mg today which will be her maintenance dose. Pt reports she did well after her first treatment and feels good to increase her dose today. Pt sees Verneita Cooks, [place] and she will follow up with her at each treatment. Explained to pt how the treatments would be scheduled and answered any questions she had today. She was given a practice nasal trainer to try a few times before given the one with medication. She verbalized understanding. Pt's Spravato is billed through buy and bill medically.  Spravato medication is stored at treatment center per REMS/FDA guidelines. The medication is required to be locked behind two doors per REMS/FDA protocol. Medication is also disposed of properly after each use per regulations. All documentation for REMS is completed and submitted per FDA/REMS requirements.          Began taking patient's vital signs at 2:12 PM 109/73, pulse 78. SpO2 Sat. 98%. Stable to proceed with treatment. Instructed patient to blow her nose if needed then recline back to a 45 degree angle. Gave patient first dose 28 mg nasal spray, administered in each nostril as directed and observed by nurse, waited 5 more minutes for the second and third dose. After all doses given pt did not complain of any nausea/vomiting, pt did have a drink to help with the taste of Spravato it gives the metallic taste. Assessed her 40 minute vitals, 2:55 PM, 116/72, pulse 64, SpO2 98%. Pt reports she came in with a headache but the treatment has improved her headache. Pt needed to go to the bathroom once during her treatment.  Explained she would be monitored for a total time of 120 minutes. Discharge vitals were taken at 3:57 PM 124/77, P 68, SpO2 99%. Verneita came to visit with patient once her thoughts were clearer to  discuss how treatment went. Recommend she go home and sleep or just relax on the couch. No driving, no intense activities. Verbalized understanding. Pt. will be receiving 2 treatments per week for 4 weeks as recommended. Nurse was with pt a total of 60 minutes for clinical assessment. Pt will continue 84 mg for her treatments. Instructed to call with any issues prior to her apts next Monday and Wednesday.   (84 mg)  LOT [id number] EXP AUG 2028

## 2024-05-01 NOTE — Progress Notes (Signed)
 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 She describes may years of struggling to fall asleep and/or stay asleep, waking up tired no matter how much I sleep. She had previous sleep eval by Dr Karlynn Ee with HST neg for OSA then. Father has OSA. Denies cataplexy or sleep paralysis.  Describes vivid dreams. Says husband not noting loud snoring or unusual movement/ activity in sleep. Now taking Xanax  2 mg and lunesta  3 mg. These may help some. Previously has tried ambien , temazepam , dalmane, belsomra , mybe doxepin and some otcs.   Prior to Admission medications   Medication Sig Start Date End Date Taking? Authorizing Provider  acetaZOLAMIDE  (DIAMOX ) 250 MG tablet TAKE 1 TABLET BY MOUTH TWICE A DAY 10/04/22  Yes Jaffe, Adam R, DO  ALPRAZolam  (XANAX ) 1 MG tablet Take 2 tablets (2 mg total) by mouth at bedtime. 10/20/23  Yes Cooks Verneita T, PA-C  Atogepant  (QULIPTA ) 60 MG TABS Take 1 tablet (60 mg total) by mouth daily. 04/10/23  Yes Skeet, Adam R, DO  botulinum toxin Type A  (BOTOX ) 200 units injection Inject 155 units IM into multiple site in the face,neck and head once every 90 days 04/19/23  Yes Jaffe, Adam R, DO  DULoxetine  (CYMBALTA ) 60 MG capsule Take 2 capsules (120 mg total) by mouth daily. 10/20/23  Yes Cooks Verneita T, PA-C  lurasidone  (LATUDA ) 80 MG TABS tablet Take 1 tablet (80 mg total) by mouth daily with breakfast. 10/20/23  Yes Hurst, Teresa T, PA-C  Methylfol-Algae-B12-Acetylcyst (CEREFOLIN NAC) 6-90.314-2-600 MG TABS Take 1 tablet by mouth daily. 07/27/23  Yes Cooks Verneita T, PA-C  prazosin  (MINIPRESS ) 2 MG capsule TAKE 1 CAPSULE  BY MOUTH AT BEDTIME. 09/27/23  Yes Hurst, Teresa T, PA-C  progesterone (PROMETRIUM) 100 MG capsule Take 100 mg by mouth daily.   Yes [provider]  Rimegepant Sulfate (NURTEC) 75 MG TBDP Take 1 tablet (75 mg total) by mouth daily as needed. 10/28/22  Yes Jaffe, Adam R, DO  Tirzepatide (MOUNJARO [place]) Inject into the skin.   Yes [provider]  tretinoin (RETIN-A) 0.1 % cream  07/10/21  Yes [provider]  Eszopiclone  3 MG TABS TAKE 1 TABLET (3 MG TOTAL) BY MOUTH AT BEDTIME AS NEEDED. 12/18/23   Cooks Verneita T, PA-C  hydrOXYzine  (ATARAX ) 25 MG tablet TAKE 1 TABLET FOR SLEEP AS NEEDED 12/17/23   Neysa Reggy BIRCH, MD   Past Medical History:  Diagnosis Date   Anxiety    Blood transfusion without reported diagnosis    Depression    Frequent headaches    GERD (gastroesophageal reflux disease)    Heart murmur    Migraine    UTI (lower urinary tract infection)    Past Surgical History:  Procedure Laterality Date   ABDOMINAL HYSTERECTOMY  2009   Emergency right after C-Section   CESAREAN SECTION     Family History  Problem Relation Age of Onset   Cancer Mother        neuroendocrine cancer   Cancer Father        skin   Healthy Daughter    Breast cancer Maternal Grandmother 35   Heart disease Maternal Grandfather    Dementia Paternal Grandmother  Alcohol abuse Paternal Grandfather    Social History   Socioeconomic History   Marital status: Married    Spouse name: Not on file   Number of children: 1   Years of education: Not on file   Highest education level: Doctorate  Occupational History    Comment: college counselor and admissions counselor  Tobacco Use   Smoking status: Never   Smokeless tobacco: Never  Vaping Use   Vaping status: Never Used  Substance and Sexual Activity   Alcohol use: Yes    Alcohol/week: 1.0 standard drink of alcohol    Types: 1 Glasses of wine per week    Comment: maybe 1-2 monthly   Drug use: No   Sexual activity: Yes     Partners: Male    Birth control/protection: Surgical  Other Topics Concern   Not on file  Social History Narrative   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    Remarried last year, husband is Presenter, Broadcasting.   She and ex husband coparent their almost 61 yo daughter.   She grew up in [place], went to [employer], married 1st husband, traumatic birth see HPI.       Caffeine-no more than 1 per day, and not over 4 days a week.    Legal-bancruptcy when she got divorced.    Religion-Quaker.   Right handed   Social Drivers of Health   Financial Resource Strain: Low Risk  (04/26/2023)   Overall Financial Resource Strain (CARDIA)    Difficulty of Paying Living Expenses: Not hard at all  Food Insecurity: No Food Insecurity (04/26/2023)   Hunger Vital Sign    Worried About Running Out of Food in the Last Year: Never true    Ran Out of Food in the Last Year: Never true  Transportation Needs: No Transportation Needs (04/26/2023)   PRAPARE - Administrator, Civil Service (Medical): No    Lack of Transportation (Non-Medical): No  Physical Activity: Insufficiently Active (04/26/2023)   Exercise Vital Sign    Days of Exercise per Week: 2 days    Minutes of Exercise per Session: 20 min  Stress: Stress Concern Present (04/26/2023)   [employer] of Occupational Health - Occupational Stress Questionnaire    Feeling of Stress : Rather much  Social Connections: Moderately Isolated (04/26/2023)   Social Connection and Isolation Panel    Frequency of Communication with Friends and Family: More than three times a week    Frequency of Social Gatherings with Friends and Family: Twice a week    Attends Religious Services: Never    Database Administrator or Organizations: No    Attends Engineer, Structural: Not on file    Marital Status: Married  Catering Manager Violence: Not At Risk (07/31/2020)    Humiliation, Afraid, Rape, and Kick questionnaire    Fear of Current or Ex-Partner: No    Emotionally Abused: No    Physically Abused: No    Sexually Abused: No    05/02/24- 48 yoF never smoker for sleep evaluation courtesy of Verneita Cooks, PA-C at [facility] with concern of Insomnia Medical problem list includes Obesity, Varicose Veins, Migraine, Anxiety/ Depression, GERD, Nephrolithiasis, 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 -Xanax  1mg  at hs, Eszopiclone  3,  Previously has tried ambien , temazepam , dalmane, belsomra , maybe doxepin and some otcs. Body weight today- 164 lbs At last visit we encouraged  shift from Xanax  2 mg to 1 mg with Hydroxyzine  for sleep and recommended CBT.  Long term goal would be to dc Xanax , but her Behavioral Health providers need to help assess this. -----Not sleeping well during night and hard to wake up in morning. Discussed the use of AI scribe software for clinical note transcription with the patient, who gave verbal consent to proceed.  History of Present Illness   Yolanda Wolfe is a 48 year old female with insomnia, anxiety, and depression who presents with difficulty sleeping and daytime drowsiness.  She experiences difficulty waking up in the morning and persistent daytime fatigue. Her medication regimen includes reducing Xanax  to one tablet and taking hydroxyzine  at bedtime. She uses esketamine (Spravato) nasal spray twice a week and is uncertain of its impact on her sleep. She participates in cognitive behavioral therapy on at least a limited basis, managed by her Behavioral Health provider..   We discussed trying to take hydroxyzine  earlier in the evening before sleep to see if that would reduce morning carry-over.     Assessment and Plan:    Insomnia complicated by anxiety and depression Chronic insomnia with difficulty waking and daytime fatigue, possibly due to medication timing.  Hydroxyzine  may cause morning drowsiness. Ongoing cognitive behavioral therapy. Esketamine used for depression, sleep impact unclear. - Adjust hydroxyzine  timing to 30 minutes earlier at bedtime. - Consider adjusting Xanax  timing. - Explore breaking hydroxyzine  tablet in half. - Continue cognitive behavioral therapy. - Follow up with sleep-trained doctor.      ROS-see HPI   + = positive Constitutional:    +weight loss, night sweats, fevers, chills, fatigue, lassitude. HEENT:    headaches, difficulty swallowing, +tooth/dental problems, sore throat,       sneezing, itching, +ear ache, nasal congestion, post nasal drip, snoring CV:    chest pain, orthopnea, PND, swelling in lower extremities, anasarca,                                   dizziness, palpitations Resp:   shortness of breath with exertion or at rest.                productive cough,   non-productive cough, coughing up of blood.              change in color of mucus.  wheezing.   Skin:    rash or lesions. GI:  +heartburn, +indigestion, abdominal pain, nausea, vomiting, diarrhea,                 change in bowel habits, +loss of appetite GU: dysuria, change in color of urine, no urgency or frequency.   flank pain. MS:   joint pain, stiffness, decreased range of motion, back pain. Neuro-     nothing unusual Psych:  change in mood or affect.  depression or +anxiety.   memory loss.  OBJ- Physical Exam General- Alert, Oriented, Affect-appropriate, Distress- none acute, medium build Skin- rash-none, lesions- none, excoriation- none Lymphadenopathy- none Head- atraumatic            Eyes- Gross vision intact, PERRLA, conjunctivae and secretions clear            Ears- Hearing, canals-normal            Nose- Clear, no-Septal dev, mucus, polyps, erosion, perforation             Throat- Mallampati III , mucosa clear ,  drainage- none, tonsils- atrophic, +teeth Neck- flexible , trachea midline, no stridor , thyroid  nl, carotid no  bruit Chest - symmetrical excursion , unlabored           Heart/CV- RRR , no murmur , no gallop  , no rub, nl s1 s2                           - JVD- none , edema- none, stasis changes- none, varices- none           Lung- clear to P&A, wheeze- none, cough- none , dullness-none, rub- none           Chest wall-  Abd-  Br/ Gen/ Rectal- Not done, not indicated Extrem- cyanosis- none, clubbing, none, atrophy- none, strength- nl Neuro- grossly intact to observation

## 2024-05-02 ENCOUNTER — Other Ambulatory Visit: Payer: Self-pay

## 2024-05-02 ENCOUNTER — Other Ambulatory Visit: Payer: Self-pay | Admitting: Physician Assistant

## 2024-05-02 ENCOUNTER — Encounter: Payer: Self-pay | Admitting: Internal Medicine

## 2024-05-02 ENCOUNTER — Ambulatory Visit: Admitting: Internal Medicine

## 2024-05-02 VITALS — BP 106/74 | HR 86 | Temp 98.0°F | Ht 64.0 in | Wt 164.8 lb

## 2024-05-02 DIAGNOSIS — F5104 Psychophysiologic insomnia: Secondary | ICD-10-CM | POA: Diagnosis not present

## 2024-05-02 DIAGNOSIS — F32A Depression, unspecified: Secondary | ICD-10-CM

## 2024-05-02 DIAGNOSIS — F411 Generalized anxiety disorder: Secondary | ICD-10-CM

## 2024-05-02 DIAGNOSIS — F419 Anxiety disorder, unspecified: Secondary | ICD-10-CM

## 2024-05-02 MED ORDER — BOTOX 200 UNITS IJ SOLR
INTRAMUSCULAR | 4 refills | Status: AC
  Filled 2024-05-02: qty 1, fill #0
  Filled 2024-05-02: qty 1, 90d supply, fill #0
  Filled 2024-07-25: qty 1, 90d supply, fill #1

## 2024-05-02 NOTE — Patient Instructions (Addendum)
 Try taking hydroxyzine  earlier- maybe 30 minutes before bedtime, to see if that helps you wake up more easily in the morning  At check out today, ask them to bring you back with a sleep doctor

## 2024-05-02 NOTE — Progress Notes (Signed)
 Specialty Pharmacy Refill Coordination Note  Yolanda Wolfe is a 48 y.o. female assessed today regarding refills of clinic administered specialty medication(s) OnabotulinumtoxinA  (Botox )   Clinic requested Courier to Provider Office   Delivery date: 05/07/24   Verified address: [facility]   [address]   [place] [address]   Medication will be filled on: 05/06/24   Appointment: 11.21.25 Copay:$0.00

## 2024-05-03 ENCOUNTER — Ambulatory Visit: Admitting: Neurology

## 2024-05-06 ENCOUNTER — Encounter: Payer: Self-pay | Admitting: Internal Medicine

## 2024-05-06 ENCOUNTER — Encounter: Payer: Self-pay | Admitting: Physician Assistant

## 2024-05-06 ENCOUNTER — Other Ambulatory Visit: Payer: Self-pay

## 2024-05-06 ENCOUNTER — Ambulatory Visit: Admitting: Physician Assistant

## 2024-05-06 ENCOUNTER — Ambulatory Visit

## 2024-05-06 VITALS — BP 112/75 | HR 77

## 2024-05-06 DIAGNOSIS — F329 Major depressive disorder, single episode, unspecified: Secondary | ICD-10-CM | POA: Diagnosis not present

## 2024-05-06 DIAGNOSIS — F411 Generalized anxiety disorder: Secondary | ICD-10-CM

## 2024-05-06 DIAGNOSIS — F5105 Insomnia due to other mental disorder: Secondary | ICD-10-CM

## 2024-05-06 NOTE — Progress Notes (Signed)
 [facility]  Patient ID: Yolanda Wolfe,  MRN: [id number]  PCP: Jordan, Betty G, MD  Date of Evaluation: 05/06/2024 Time spent:60 minutes  Chief Complaint:  Chief Complaint   Depression; Other    HISTORY/CURRENT STATUS: HPI  For Spravato treatment # 7  She does not feel much difference as far as the depression goes since starting the Spravato, however her husband has said he notices a little improvement.  The only change that she can pinpoint is the fact that things do not bother her as much as they did before starting the Spravato.  But it is not like she is super happy.  She does not have more energy or motivation.  She is sleeping okay.  Work is going fine.  As usual, she dreads going in but once she gets there it goes well.  ADLs and personal hygiene are normal.   Denies any changes in concentration, making decisions, or remembering things.  Appetite has not changed.  No complaints of anxiety.  No mania, delirium, AH/VH.  No SI/HI.  Individual Medical History/ Review of Systems: Changes? :No     Past medications for mental health diagnoses include: Amitriptylline for sleep? Celexa,  Zoloft , Cymbalta , Wellbutrin  caused migraines to worsen,  Lunesta , Ativan , Belsomra , Temazapam,  trazodone  not effective at low doses but at 100 mg she was too groggy, Mirtazapine  caused extreme wt gain, Sonata  ineffective, Ambien   Topamax  for migraines, somewhat effective  Allergies: Wellbutrin  [bupropion ]  Current Medications:  Current Outpatient Medications:    ALPRAZolam  (XANAX ) 1 MG tablet, TAKE 2 TABLETS (2 MG TOTAL) BY MOUTH AT BEDTIME, Disp: 60 tablet, Rfl: 5   Atogepant  (QULIPTA ) 60 MG TABS, TAKE 1 TABLET BY MOUTH EVERY DAY, Disp: 30 tablet, Rfl: 0   botulinum toxin Type A  (BOTOX ) 200 units injection, Inject 155 units IM into multiple site in the face,neck and head once every 90 days, Disp: 1 each, Rfl: 4   DULoxetine  (CYMBALTA ) 60 MG capsule, Take 2 capsules (120 mg  total) by mouth daily., Disp: 180 capsule, Rfl: 1   Esketamine HCl, 84 MG Dose, (SPRAVATO, 84 MG DOSE,) 28 MG/DEVICE SOPK, Place 84 mg into the nose every 3 (three) days., Disp: , Rfl:    estradiol (LYLLANA) 0.0375 MG/24HR, Apply 1 patch twice a week by transdermal route., Disp: , Rfl:    Eszopiclone  3 MG TABS, Take 1 tablet (3 mg total) by mouth at bedtime as needed., Disp: 30 tablet, Rfl: 5   hydrOXYzine  (ATARAX ) 25 MG tablet, TAKE 1 TABLET FOR SLEEP AS NEEDED, Disp: 90 tablet, Rfl: 1   lurasidone  (LATUDA ) 80 MG TABS tablet, Take 1 tablet (80 mg total) by mouth daily with breakfast., Disp: 90 tablet, Rfl: 1   Methylfol-Algae-B12-Acetylcyst (CEREFOLIN NAC) 6-90.314-2-600 MG TABS, Take 1 tablet by mouth daily., Disp: 30 tablet, Rfl: 11   NURTEC 75 MG TBDP, TAKE 1 TABLET (75 MG TOTAL) BY MOUTH DAILY AS NEEDED, Disp: 16 tablet, Rfl: 5   prazosin  (MINIPRESS ) 1 MG capsule, TAKE 3 CAPSULES (3 MG TOTAL) BY MOUTH AT BEDTIME, Disp: 90 capsule, Rfl: 1   progesterone (PROMETRIUM) 100 MG capsule, Take 100 mg by mouth daily., Disp: , Rfl:    Tirzepatide (MOUNJARO [place]), Inject into the skin., Disp: , Rfl:    tretinoin (RETIN-A) 0.1 % cream, , Disp: , Rfl:  Medication Side Effects: H/A w/ Wellbutrin , Sexual dysfunction with Cymbalta    Family Medical/ Social History: Changes? no  MENTAL HEALTH EXAM:  There were no vitals taken  for this visit.There is no height or weight on file to calculate BMI.  General Appearance: Casual and Well Groomed  Eye Contact:  Good  Speech:  Clear and Coherent and Normal Rate  Volume:  Normal  Mood:  Euthymic  Affect:  Congruent  Thought Process:  Goal Directed and Descriptions of Associations: Circumstantial  Orientation:  Full (Time, Place, and Person)  Thought Content: Logical   Suicidal Thoughts:  No  Homicidal Thoughts:  No  Memory:  WNL  Judgement:  Good  Insight:  Good  Psychomotor Activity:  Normal  Concentration:  Concentration: Good and Attention Span: Good   Recall:  Good  Fund of Knowledge: Good  Language: Good  Assets:  Communication Skills Desire for Improvement Financial Resources/Insurance Housing Leisure Time Physical Health Resilience Social Support Transportation Vocational/Educational  ADL's:  Intact  Cognition: WNL  Prognosis:  Good   Dr. Jordan is following labs.   GeneSight test results are in chart under Labs from 08/05/2020, had moderately reduced folic acid conversion  Patient was administered Spravato 84 mg intranasally today.  The patient experienced the typical dissociation which gradually resolved over the 2-hour period of observation.  There were no complications.  Specifically the patient did not have nausea or vomiting or headache.  Blood pressures and pulse ox levels remained within normal ranges at the 40-minute and 2-hour follow-up intervals.  By the time the 2-hour observation period was met the patient was alert and oriented and able to exit without assistance.  See nursing note for further details.  ECT-MADRS    Flowsheet Row Office Visit from 02/27/2024 in [facility] Office Visit from 10/20/2023 in [facility] Office Visit from 08/31/2023 in [facility]  MADRS Total Score 34 17 38   GAD-7    Flowsheet Row Office Visit from 07/25/2022 in [facility] [place] Office Visit from 07/31/2020 in [facility]  Total GAD-7 Score 5 13   PHQ2-9    Flowsheet Row Office Visit from 07/25/2022 in [facility] [place] Office Visit from 03/30/2022 in [facility] [place] Video Visit from 02/25/2022 in [facility] [place] Office Visit from 07/31/2020 in [facility]  PHQ-2 Total Score 2 6 2 1   PHQ-9 Total Score 10 23 10 9    DIAGNOSES:    ICD-10-CM   1. Treatment-resistant depression  F32.9      2. Generalized anxiety disorder  F41.1     3. Insomnia due to other mental disorder  F51.05    F99       Receiving Psychotherapy: Yes with Marval Bunde, LCSW  RECOMMENDATIONS:  PDMP was reviewed.  Xanax  filled 05/03/2024.  Lunesta  filled 04/25/2024. I provided approximately 60 minutes of face to face time during this encounter, including time spent before and after the visit in records review, medical decision making, counseling pertinent to today's visit, and charting.   She is tolerating the Spravato well so we will continue same treatment.  Hopefully she will see more positive results in the next few weeks.  Continue Xanax  1 mg, 2 p.o. nightly as needed. Continue Cymbalta   60 mg, 2 every day.  Continue Spravato 84 mg twice a week. Continue Cerafolin NAC, 1 daily. Continue Lunesta  3 mg, 1 p.o. nightly. Continue hydroxyzine  25 mg, 1 p.o. nightly as needed. Continue  Latuda   80 mg, 1 p.o. q. evening with food. Continue  prazosin  3 mg, 1 p.o. nightly.   Continue therapy with Marval Bunde, LCSW. Return in 2 days.             Verneita Cooks, PA-C

## 2024-05-07 ENCOUNTER — Ambulatory Visit: Admitting: Psychiatry

## 2024-05-07 NOTE — Progress Notes (Signed)
 NURSES NOTE:         Pt arrived for her 7 th Spravato Treatment for treatment resistant depression, the starting dose was 56 mg (2 of the 28 mg nasal sprays), pt tolerated that dose well she will receive 84 mg today which will be her maintenance dose. Pt reports she did well after her first treatment and feels good to increase her dose today. Pt sees Verneita Cooks, [place] and she will follow up with her at each treatment. Explained to pt how the treatments would be scheduled and answered any questions she had today. She was given a practice nasal trainer to try a few times before given the one with medication. She verbalized understanding. Pt's Spravato is billed through buy and bill medically.  Spravato medication is stored at treatment center per REMS/FDA guidelines. The medication is required to be locked behind two doors per REMS/FDA protocol. Medication is also disposed of properly after each use per regulations. All documentation for REMS is completed and submitted per FDA/REMS requirements.          Began taking patient's vital signs at 2:00 PM 122/75, pulse 78. SpO2 Sat. 98%. Stable to proceed with treatment. Instructed patient to blow her nose if needed then recline back to a 45 degree angle. Gave patient first dose 28 mg nasal spray, administered in each nostril as directed and observed by nurse, waited 5 more minutes for the second and third dose. After all doses given pt did not complain of any nausea/vomiting, pt did have a drink to help with the taste of Spravato it gives the metallic taste. Assessed her 40 minute vitals, 2:40 PM, 115/72, pulse 76, SpO2 97%. Pt reports she came in with a headache but the treatment has improved her headache. Pt needed to go to the bathroom once during her treatment.  Explained she would be monitored for a total time of 120 minutes. Discharge vitals were taken at 4:00 PM 112/75, P 77, SpO2 97%. Verneita came to visit with patient once her thoughts were clearer to  discuss how treatment went. Recommend she go home and sleep or just relax on the couch. No driving, no intense activities. Verbalized understanding. Pt. will be receiving 2 treatments per week for 4 weeks as recommended. Nurse was with pt a total of 60 minutes for clinical assessment. Pt will continue 84 mg for her treatments. Instructed to call with any issues prior to her apt on Wednesday.   (84 mg)  LOT [id number] EXP AUG 2028

## 2024-05-08 ENCOUNTER — Encounter: Payer: Self-pay | Admitting: Physician Assistant

## 2024-05-08 ENCOUNTER — Ambulatory Visit

## 2024-05-08 ENCOUNTER — Ambulatory Visit: Admitting: Physician Assistant

## 2024-05-08 VITALS — BP 123/70 | HR 80

## 2024-05-08 DIAGNOSIS — F411 Generalized anxiety disorder: Secondary | ICD-10-CM

## 2024-05-08 DIAGNOSIS — F329 Major depressive disorder, single episode, unspecified: Secondary | ICD-10-CM

## 2024-05-08 DIAGNOSIS — F5105 Insomnia due to other mental disorder: Secondary | ICD-10-CM

## 2024-05-08 NOTE — Progress Notes (Signed)
 [facility]  Patient ID: Yolanda Wolfe,  MRN: [id number]  PCP: Jordan, Betty G, MD  Date of Evaluation: 05/08/2024 Time spent:65 minutes  Chief Complaint:  Chief Complaint   Depression; Other    HISTORY/CURRENT STATUS: HPI  For Spravato treatment # 8  This is the end of the first month of spravato.  States she cannot tell much difference at all.  Her husband had reported some improvement in her mood.  He has been out of town for a few days so she has not been able to discuss it with him.  Her parents are in [place] so no collateral from them.  She still has low energy and motivation.  She goes to work without problems but dreads going.  After she gets there she is fine.  Appetite is normal.  ADLs and personal hygiene are normal.  She does not cry easily.  No mania, delirium, psychosis, or suicidal/homicidal thoughts.  Individual Medical History/ Review of Systems: Changes? :No     Past medications for mental health diagnoses include: Amitriptylline for sleep? Celexa,  Zoloft , Cymbalta , Wellbutrin  caused migraines to worsen,  Lunesta , Ativan , Belsomra , Temazapam,  trazodone  not effective at low doses but at 100 mg she was too groggy, Mirtazapine  caused extreme wt gain, Sonata  ineffective, Ambien   Topamax  for migraines, somewhat effective  Allergies: Wellbutrin  [bupropion ]  Current Medications:  Current Outpatient Medications:    ALPRAZolam  (XANAX ) 1 MG tablet, TAKE 2 TABLETS (2 MG TOTAL) BY MOUTH AT BEDTIME, Disp: 60 tablet, Rfl: 5   Atogepant  (QULIPTA ) 60 MG TABS, TAKE 1 TABLET BY MOUTH EVERY DAY, Disp: 30 tablet, Rfl: 0   botulinum toxin Type A  (BOTOX ) 200 units injection, Inject 155 units IM into multiple site in the face,neck and head once every 90 days, Disp: 1 each, Rfl: 4   DULoxetine  (CYMBALTA ) 60 MG capsule, Take 2 capsules (120 mg total) by mouth daily., Disp: 180 capsule, Rfl: 1   Esketamine HCl, 84 MG Dose, (SPRAVATO, 84 MG DOSE,) 28 MG/DEVICE  SOPK, Place 84 mg into the nose every 3 (three) days., Disp: , Rfl:    estradiol (LYLLANA) 0.0375 MG/24HR, Apply 1 patch twice a week by transdermal route., Disp: , Rfl:    Eszopiclone  3 MG TABS, Take 1 tablet (3 mg total) by mouth at bedtime as needed., Disp: 30 tablet, Rfl: 5   hydrOXYzine  (ATARAX ) 25 MG tablet, TAKE 1 TABLET FOR SLEEP AS NEEDED, Disp: 90 tablet, Rfl: 1   lurasidone  (LATUDA ) 80 MG TABS tablet, Take 1 tablet (80 mg total) by mouth daily with breakfast., Disp: 90 tablet, Rfl: 1   Methylfol-Algae-B12-Acetylcyst (CEREFOLIN NAC) 6-90.314-2-600 MG TABS, Take 1 tablet by mouth daily., Disp: 30 tablet, Rfl: 11   NURTEC 75 MG TBDP, TAKE 1 TABLET (75 MG TOTAL) BY MOUTH DAILY AS NEEDED, Disp: 16 tablet, Rfl: 5   prazosin  (MINIPRESS ) 1 MG capsule, TAKE 3 CAPSULES (3 MG TOTAL) BY MOUTH AT BEDTIME, Disp: 90 capsule, Rfl: 1   progesterone (PROMETRIUM) 100 MG capsule, Take 100 mg by mouth daily., Disp: , Rfl:    Tirzepatide (MOUNJARO [place]), Inject into the skin., Disp: , Rfl:    tretinoin (RETIN-A) 0.1 % cream, , Disp: , Rfl:  Medication Side Effects: H/A w/ Wellbutrin , Sexual dysfunction with Cymbalta    Family Medical/ Social History: Changes? no  MENTAL HEALTH EXAM:  There were no vitals taken for this visit.There is no height or weight on file to calculate BMI.  General Appearance: Casual and Well Groomed  Eye Contact:  Good  Speech:  Clear and Coherent and Normal Rate  Volume:  Normal  Mood:  Sad  Affect:  Congruent  Thought Process:  Goal Directed and Descriptions of Associations: Circumstantial  Orientation:  Full (Time, Place, and Person)  Thought Content: Logical   Suicidal Thoughts:  No  Homicidal Thoughts:  No  Memory:  WNL  Judgement:  Good  Insight:  Good  Psychomotor Activity:  Normal  Concentration:  Concentration: Good and Attention Span: Good  Recall:  Good  Fund of Knowledge: Good  Language: Good  Assets:  Communication Skills Desire for Improvement Financial  Resources/Insurance Housing Leisure Time Physical Health Resilience Social Support Transportation Vocational/Educational  ADL's:  Intact  Cognition: WNL  Prognosis:  Good   Dr. Jordan is following labs.   GeneSight test results are in chart under Labs from 08/05/2020, had moderately reduced folic acid conversion  Patient was administered Spravato 84 mg intranasally today.  The patient experienced the typical dissociation which gradually resolved over the 2-hour period of observation.  There were no complications.  Specifically the patient did not have nausea or vomiting or headache.  Blood pressures and pulse ox levels remained within normal ranges at the 40-minute and 2-hour follow-up intervals.  By the time the 2-hour observation period was met the patient was alert and oriented and able to exit without assistance.  See nursing note for further details.  ECT-MADRS    Flowsheet Row Office Visit from 02/27/2024 in [facility] Office Visit from 10/20/2023 in [facility] Office Visit from 08/31/2023 in [facility]  MADRS Total Score 34 17 38   GAD-7    Flowsheet Row Office Visit from 07/25/2022 in [facility] [place] Office Visit from 07/31/2020 in [facility]  Total GAD-7 Score 5 13   PHQ2-9    Flowsheet Row Office Visit from 07/25/2022 in [facility] [place] Office Visit from 03/30/2022 in [facility] [place] Video Visit from 02/25/2022 in [facility] [place] Office Visit from 07/31/2020 in [facility]  PHQ-2 Total Score 2 6 2 1   PHQ-9 Total Score 10 23 10 9    DIAGNOSES:    ICD-10-CM   1. Treatment-resistant depression  F32.9     2. Generalized anxiety disorder  F41.1     3. Insomnia due to other mental disorder  F51.05    F99       Receiving  Psychotherapy: Yes with Marval Bunde, LCSW  RECOMMENDATIONS:  PDMP was reviewed.  Xanax  filled 05/03/2024.  Lunesta  filled 04/25/2024. I provided approximately 65 minutes of face to face time during this encounter, including time spent before and after the visit in records review, medical decision making, counseling pertinent to today's visit, and charting.   We discussed her response to the Spravato.  This is treatment 8, and so far she is not feeling any improvement although her husband had reported he saw a positive difference a week or so ago.  I recommend that we go another 2 weeks at twice weekly treatments and get collateral info from her husband and parents after they are all back at home in their normal routines.  I would like for her to be at least 50% better by the end of the next 2 weeks, if she has not been we may stop the Spravato.  We will play ear.  Continue  Xanax  1 mg, 2 p.o. nightly as needed. Continue Cymbalta   60 mg, 2 every day.  Continue Spravato 84 mg twice a week. Continue Cerafolin NAC, 1 daily. Continue Lunesta  3 mg, 1 p.o. nightly. Continue hydroxyzine  25 mg, 1 p.o. nightly as needed. Continue  Latuda   80 mg, 1 p.o. q. evening with food. Continue prazosin  3 mg, 1 p.o. nightly.   Continue therapy with Marval Bunde, LCSW. Return in 2 days.             Verneita Cooks, PA-C

## 2024-05-08 NOTE — Progress Notes (Signed)
 NURSES NOTE:         Pt arrived for her 8th Spravato Treatment for treatment resistant depression, the starting dose was 56 mg (2 of the 28 mg nasal sprays), pt tolerated that dose well she will receive 84 mg today which will be her maintenance dose. Pt reports she did well after her first treatment and feels good to increase her dose today. Pt sees Verneita Cooks, [place] and she will follow up with her at each treatment. Explained to pt how the treatments would be scheduled and answered any questions she had today. She was given a practice nasal trainer to try a few times before given the one with medication. She verbalized understanding. Pt's Spravato is billed through buy and bill medically.  Spravato medication is stored at treatment center per REMS/FDA guidelines. The medication is required to be locked behind two doors per REMS/FDA protocol. Medication is also disposed of properly after each use per regulations. All documentation for REMS is completed and submitted per FDA/REMS requirements.          Began taking patient's vital signs at 2:05 PM 127/77, pulse 65. SpO2 Sat. 99%. Stable to proceed with treatment. Instructed patient to blow her nose if needed then recline back to a 45 degree angle. Gave patient first dose 28 mg nasal spray, administered in each nostril as directed and observed by nurse, waited 5 more minutes for the second and third dose. After all doses given pt did not complain of any nausea/vomiting, pt did have a drink to help with the taste of Spravato it gives the metallic taste. Assessed her 40 minute vitals, 2:45 PM, 129/77, pulse 60, SpO2 98%. Pt needed to go to the bathroom once during her treatment.  Explained she would be monitored for a total time of 120 minutes. Discharge vitals were taken at 4:00 PM 123/70, P 80, SpO2 97%. Verneita came to visit with patient once her thoughts were clearer to discuss how treatment went. Recommend she go home and sleep or just relax on the couch.  No driving, no intense activities. Verbalized understanding. After discussion with Verneita they decided to continue her treatments twice a week for 2 additional week. Nurse was with pt a total of 60 minutes for clinical assessment. Pt will continue 84 mg for her treatments. Instructed to call with any issues prior to her apt on Monday and Wednesday.   (84 mg)  LOT [id number] EXP AUG 2028

## 2024-05-10 ENCOUNTER — Ambulatory Visit: Admitting: Neurology

## 2024-05-10 DIAGNOSIS — G43009 Migraine without aura, not intractable, without status migrainosus: Secondary | ICD-10-CM

## 2024-05-10 NOTE — Progress Notes (Unsigned)

## 2024-05-13 ENCOUNTER — Encounter: Payer: Self-pay | Admitting: Physician Assistant

## 2024-05-13 ENCOUNTER — Ambulatory Visit

## 2024-05-13 ENCOUNTER — Ambulatory Visit (INDEPENDENT_AMBULATORY_CARE_PROVIDER_SITE_OTHER): Admitting: Physician Assistant

## 2024-05-13 VITALS — BP 130/76 | HR 66

## 2024-05-13 DIAGNOSIS — F329 Major depressive disorder, single episode, unspecified: Secondary | ICD-10-CM

## 2024-05-13 DIAGNOSIS — F411 Generalized anxiety disorder: Secondary | ICD-10-CM

## 2024-05-13 DIAGNOSIS — F5105 Insomnia due to other mental disorder: Secondary | ICD-10-CM

## 2024-05-13 DIAGNOSIS — E7212 Methylenetetrahydrofolate reductase deficiency: Secondary | ICD-10-CM

## 2024-05-13 MED ORDER — ONABOTULINUMTOXINA 100 UNITS IJ SOLR
200.0000 [IU] | Freq: Once | INTRAMUSCULAR | Status: AC
  Administered 2024-05-10: 155 [IU] via INTRAMUSCULAR

## 2024-05-13 NOTE — Progress Notes (Signed)
 NURSES NOTE:         Pt arrived for her 9th Spravato  Treatment for treatment resistant depression, the starting dose was 56 mg (2 of the 28 mg nasal sprays), pt tolerated that dose well she will receive 84 mg today which will be her maintenance dose. Pt reports she did well after her first treatment and feels good to increase her dose today. Pt sees Verneita Cooks, [place] and she will follow up with her at each treatment. Explained to pt how the treatments would be scheduled and answered any questions she had today. She was given a practice nasal trainer to try a few times before given the one with medication. She verbalized understanding. Pt's Spravato  is billed through buy and bill medically.  Spravato  medication is stored at treatment center per REMS/FDA guidelines. The medication is required to be locked behind two doors per REMS/FDA protocol. Medication is also disposed of properly after each use per regulations. All documentation for REMS is completed and submitted per FDA/REMS requirements.          Began taking patient's vital signs at 2:05 PM 126/74, pulse 62. SpO2 Sat. 98%. Stable to proceed with treatment. Instructed patient to blow her nose if needed then recline back to a 45 degree angle. Gave patient first dose 28 mg nasal spray, administered in each nostril as directed and observed by nurse, waited 5 more minutes for the second and third dose. After all doses given pt did not complain of any nausea/vomiting, pt did have a drink to help with the taste of Spravato  it gives the metallic taste. Assessed her 40 minute vitals, 2:45 PM, 126/78, pulse 63, SpO2 99%. Pt needed to go to the bathroom once during her treatment.  Explained she would be monitored for a total time of 120 minutes. Discharge vitals were taken at 4:05 PM 130/76, P 66, SpO2 98%. Verneita came to visit with patient once her thoughts were clearer to discuss how treatment went. Recommend she go home and sleep or just relax on the couch.  No driving, no intense activities. Verbalized understanding. After discussion with Verneita they decided to continue her treatments twice a week for 2 additional week. Nurse was with pt a total of 60 minutes for clinical assessment. Pt will continue 84 mg for her treatments. Instructed to call with any issues prior to her apt on Wednesday.   (84 mg)  LOT [id number] EXP AUG 2028

## 2024-05-13 NOTE — Progress Notes (Unsigned)
 [facility]  Patient ID: Yolanda Wolfe,  MRN: [id number]  PCP: Jordan, Betty G, MD  Date of Evaluation: 05/13/2024 Time spent:65 minutes  Chief Complaint:   HISTORY/CURRENT STATUS: HPI  For Spravato  treatment #      Individual Medical History/ Review of Systems: Changes? :No     Past medications for mental health diagnoses include: Amitriptylline for sleep? Celexa,  Zoloft , Cymbalta , Wellbutrin  caused migraines to worsen,  Lunesta , Ativan , Belsomra , Temazapam,  trazodone  not effective at low doses but at 100 mg she was too groggy, Mirtazapine  caused extreme wt gain, Sonata  ineffective, Ambien   Topamax  for migraines, somewhat effective  Allergies: Wellbutrin  [bupropion ]  Current Medications:  Current Outpatient Medications:    ALPRAZolam  (XANAX ) 1 MG tablet, TAKE 2 TABLETS (2 MG TOTAL) BY MOUTH AT BEDTIME, Disp: 60 tablet, Rfl: 5   Atogepant  (QULIPTA ) 60 MG TABS, TAKE 1 TABLET BY MOUTH EVERY DAY, Disp: 30 tablet, Rfl: 0   botulinum toxin Type A  (BOTOX ) 200 units injection, Inject 155 units IM into multiple site in the face,neck and head once every 90 days, Disp: 1 each, Rfl: 4   DULoxetine  (CYMBALTA ) 60 MG capsule, Take 2 capsules (120 mg total) by mouth daily., Disp: 180 capsule, Rfl: 1   Esketamine HCl, 84 MG Dose, (SPRAVATO , 84 MG DOSE,) 28 MG/DEVICE SOPK, Place 84 mg into the nose every 3 (three) days., Disp: , Rfl:    estradiol (LYLLANA) 0.0375 MG/24HR, Apply 1 patch twice a week by transdermal route., Disp: , Rfl:    Eszopiclone  3 MG TABS, Take 1 tablet (3 mg total) by mouth at bedtime as needed., Disp: 30 tablet, Rfl: 5   hydrOXYzine  (ATARAX ) 25 MG tablet, TAKE 1 TABLET FOR SLEEP AS NEEDED, Disp: 90 tablet, Rfl: 1   lurasidone  (LATUDA ) 80 MG TABS tablet, Take 1 tablet (80 mg total) by mouth daily with breakfast., Disp: 90 tablet, Rfl: 1   Methylfol-Algae-B12-Acetylcyst (CEREFOLIN NAC) 6-90.314-2-600 MG TABS, Take 1 tablet by mouth daily., Disp: 30  tablet, Rfl: 11   NURTEC 75 MG TBDP, TAKE 1 TABLET (75 MG TOTAL) BY MOUTH DAILY AS NEEDED, Disp: 16 tablet, Rfl: 5   prazosin  (MINIPRESS ) 1 MG capsule, TAKE 3 CAPSULES (3 MG TOTAL) BY MOUTH AT BEDTIME, Disp: 90 capsule, Rfl: 1   progesterone (PROMETRIUM) 100 MG capsule, Take 100 mg by mouth daily., Disp: , Rfl:    Tirzepatide (MOUNJARO [place]), Inject into the skin., Disp: , Rfl:    tretinoin (RETIN-A) 0.1 % cream, , Disp: , Rfl:  Medication Side Effects: H/A w/ Wellbutrin , Sexual dysfunction with Cymbalta    Family Medical/ Social History: Changes? no  MENTAL HEALTH EXAM:  There were no vitals taken for this visit.There is no height or weight on file to calculate BMI.  General Appearance: Casual and Well Groomed  Eye Contact:  Good  Speech:  Clear and Coherent and Normal Rate  Volume:  Normal  Mood:  Sad  Affect:  Congruent  Thought Process:  Goal Directed and Descriptions of Associations: Circumstantial  Orientation:  Full (Time, Place, and Person)  Thought Content: Logical   Suicidal Thoughts:  No  Homicidal Thoughts:  No  Memory:  WNL  Judgement:  Good  Insight:  Good  Psychomotor Activity:  Normal  Concentration:  Concentration: Good and Attention Span: Good  Recall:  Good  Fund of Knowledge: Good  Language: Good  Assets:  Communication Skills Desire for Improvement Financial Resources/Insurance Housing Leisure Time Physical Health Resilience Social Support Transportation Vocational/Educational  ADL's:  Intact  Cognition: WNL  Prognosis:  Good   Dr. Jordan is following labs.   GeneSight test results are in chart under Labs from 08/05/2020, had moderately reduced folic acid conversion  Patient was administered Spravato  84 mg intranasally today.  The patient experienced the typical dissociation which gradually resolved over the 2-hour period of observation.  There were no complications.  Specifically the patient did not have nausea or vomiting or headache.  Blood  pressures and pulse ox levels remained within normal ranges at the 40-minute and 2-hour follow-up intervals.  By the time the 2-hour observation period was met the patient was alert and oriented and able to exit without assistance.  See nursing note for further details.  ECT-MADRS    Flowsheet Row Office Visit from 02/27/2024 in [facility] Office Visit from 10/20/2023 in [facility] Office Visit from 08/31/2023 in [facility]  MADRS Total Score 34 17 38   GAD-7    Flowsheet Row Office Visit from 07/25/2022 in [facility] [place] Office Visit from 07/31/2020 in [facility]  Total GAD-7 Score 5 13   PHQ2-9    Flowsheet Row Office Visit from 07/25/2022 in [facility] [place] Office Visit from 03/30/2022 in [facility] [place] Video Visit from 02/25/2022 in [facility] [place] Office Visit from 07/31/2020 in [facility]  PHQ-2 Total Score 2 6 2 1   PHQ-9 Total Score 10 23 10 9    DIAGNOSES:  No diagnosis found.   Receiving Psychotherapy: Yes with Marval Bunde, LCSW  RECOMMENDATIONS:  PDMP was reviewed.  Xanax  filled 05/03/2024.  Lunesta  filled 04/25/2024.      Continue Xanax  1 mg, 2 p.o. nightly as needed. Continue Cymbalta   60 mg, 2 every day.  Continue Spravato  84 mg twice a week. Continue Cerafolin NAC, 1 daily. Continue Lunesta  3 mg, 1 p.o. nightly. Continue hydroxyzine  25 mg, 1 p.o. nightly as needed. Continue  Latuda   80 mg, 1 p.o. q. evening with food. Continue prazosin  3 mg, 1 p.o. nightly.   Continue therapy with Marval Bunde, LCSW. Return in 2 days.             Verneita Cooks, PA-C

## 2024-05-14 ENCOUNTER — Encounter: Payer: Self-pay | Admitting: Physician Assistant

## 2024-05-15 ENCOUNTER — Ambulatory Visit

## 2024-05-15 ENCOUNTER — Encounter: Payer: Self-pay | Admitting: Physician Assistant

## 2024-05-15 ENCOUNTER — Ambulatory Visit: Admitting: Physician Assistant

## 2024-05-15 DIAGNOSIS — F329 Major depressive disorder, single episode, unspecified: Secondary | ICD-10-CM

## 2024-05-15 DIAGNOSIS — F5105 Insomnia due to other mental disorder: Secondary | ICD-10-CM | POA: Diagnosis not present

## 2024-05-15 DIAGNOSIS — F411 Generalized anxiety disorder: Secondary | ICD-10-CM

## 2024-05-15 DIAGNOSIS — F99 Mental disorder, not otherwise specified: Secondary | ICD-10-CM

## 2024-05-15 NOTE — Progress Notes (Signed)
 [facility]  Patient ID: Yolanda Wolfe,  MRN: [id number]  PCP: Jordan, Betty G, MD  Date of Evaluation: 05/15/2024 Time spent:60 minutes  Chief Complaint:  Chief Complaint   Depression; Other    HISTORY/CURRENT STATUS: HPI  For Spravato  treatment # 10  Yolanda Wolfe states she feels about the same.  No better since starting the Spravato .  States her husband nor parents noticed a positive difference.  She still does not want to do much of anything.  Energy and motivation are low.  Once she gets to work she is okay but being motivated is difficult.  She does not cry easily.  Personal hygiene is normal.  ADLs are decreased to only the necessary things.  Anxiety is controlled.  She sleeps fairly well with her current regimen.  No nightmares reported.  No mania, delirium, psychosis, suicidal or homicidal thoughts.  Individual Medical History/ Review of Systems: Changes? :No     Past medications for mental health diagnoses include: Amitriptylline for sleep? Celexa,  Zoloft , Cymbalta , Wellbutrin  caused migraines to worsen,  Lunesta , Ativan , Belsomra , Temazapam,  trazodone  not effective at low doses but at 100 mg she was too groggy, Mirtazapine  caused extreme wt gain, Sonata  ineffective, Ambien   Topamax  for migraines, somewhat effective  Allergies: Wellbutrin  [bupropion ]  Current Medications:  Current Outpatient Medications:    ALPRAZolam  (XANAX ) 1 MG tablet, TAKE 2 TABLETS (2 MG TOTAL) BY MOUTH AT BEDTIME, Disp: 60 tablet, Rfl: 5   Atogepant  (QULIPTA ) 60 MG TABS, TAKE 1 TABLET BY MOUTH EVERY DAY, Disp: 30 tablet, Rfl: 0   botulinum toxin Type A  (BOTOX ) 200 units injection, Inject 155 units IM into multiple site in the face,neck and head once every 90 days, Disp: 1 each, Rfl: 4   DULoxetine  (CYMBALTA ) 60 MG capsule, Take 2 capsules (120 mg total) by mouth daily., Disp: 180 capsule, Rfl: 1   Esketamine HCl, 84 MG Dose, (SPRAVATO , 84 MG DOSE,) 28 MG/DEVICE SOPK, Place 84  mg into the nose every 3 (three) days., Disp: , Rfl:    estradiol (LYLLANA) 0.0375 MG/24HR, Apply 1 patch twice a week by transdermal route., Disp: , Rfl:    Eszopiclone  3 MG TABS, Take 1 tablet (3 mg total) by mouth at bedtime as needed., Disp: 30 tablet, Rfl: 5   hydrOXYzine  (ATARAX ) 25 MG tablet, TAKE 1 TABLET FOR SLEEP AS NEEDED, Disp: 90 tablet, Rfl: 1   lurasidone  (LATUDA ) 80 MG TABS tablet, Take 1 tablet (80 mg total) by mouth daily with breakfast., Disp: 90 tablet, Rfl: 1   Methylfol-Algae-B12-Acetylcyst (CEREFOLIN NAC) 6-90.314-2-600 MG TABS, Take 1 tablet by mouth daily., Disp: 30 tablet, Rfl: 11   NURTEC 75 MG TBDP, TAKE 1 TABLET (75 MG TOTAL) BY MOUTH DAILY AS NEEDED, Disp: 16 tablet, Rfl: 5   prazosin  (MINIPRESS ) 1 MG capsule, TAKE 3 CAPSULES (3 MG TOTAL) BY MOUTH AT BEDTIME, Disp: 90 capsule, Rfl: 1   progesterone (PROMETRIUM) 100 MG capsule, Take 100 mg by mouth daily., Disp: , Rfl:    Tirzepatide (MOUNJARO [place]), Inject into the skin., Disp: , Rfl:    tretinoin (RETIN-A) 0.1 % cream, , Disp: , Rfl:  Medication Side Effects: H/A w/ Wellbutrin , Sexual dysfunction with Cymbalta    Family Medical/ Social History: Changes? no  MENTAL HEALTH EXAM:  There were no vitals taken for this visit.There is no height or weight on file to calculate BMI.  General Appearance: Casual and Well Groomed  Eye Contact:  Good  Speech:  Clear and Coherent and  Normal Rate  Volume:  Normal  Mood:  Sad  Affect:  Congruent  Thought Process:  Goal Directed and Descriptions of Associations: Circumstantial  Orientation:  Full (Time, Place, and Person)  Thought Content: Logical   Suicidal Thoughts:  No  Homicidal Thoughts:  No  Memory:  WNL  Judgement:  Good  Insight:  Good  Psychomotor Activity:  Normal  Concentration:  Concentration: Good and Attention Span: Good  Recall:  Good  Fund of Knowledge: Good  Language: Good  Assets:  Communication Skills Desire for Improvement Financial  Resources/Insurance Housing Leisure Time Physical Health Resilience Social Support Transportation Vocational/Educational  ADL's:  Intact  Cognition: WNL  Prognosis:  Good   Dr. Jordan is following labs.   GeneSight test results are in chart under Labs from 08/05/2020, had moderately reduced folic acid conversion  Patient was administered Spravato  84 mg intranasally today.  The patient experienced the typical dissociation which gradually resolved over the 2-hour period of observation.  There were no complications.  Specifically the patient did not have nausea or vomiting or headache.  Blood pressures and pulse ox levels remained within normal ranges at the 40-minute and 2-hour follow-up intervals.  By the time the 2-hour observation period was met the patient was alert and oriented and able to exit without assistance.  See nursing note for further details.  ECT-MADRS    Flowsheet Row Office Visit from 02/27/2024 in [facility] Office Visit from 10/20/2023 in [facility] Office Visit from 08/31/2023 in [facility]  MADRS Total Score 34 17 38   GAD-7    Flowsheet Row Office Visit from 07/25/2022 in [facility] [place] Office Visit from 07/31/2020 in [facility]  Total GAD-7 Score 5 13   PHQ2-9    Flowsheet Row Office Visit from 07/25/2022 in [facility] [place] Office Visit from 03/30/2022 in [facility] [place] Video Visit from 02/25/2022 in [facility] [place] Office Visit from 07/31/2020 in [facility]  PHQ-2 Total Score 2 6 2 1   PHQ-9 Total Score 10 23 10 9    DIAGNOSES:    ICD-10-CM   1. Treatment-resistant depression  F32.9     2. Generalized anxiety disorder  F41.1     3. Insomnia due to other mental disorder  F51.05    F99      Receiving  Psychotherapy: Yes with Yolanda Bunde, LCSW  RECOMMENDATIONS:  PDMP was reviewed.  Xanax  filled 05/03/2024.  Lunesta  filled 04/25/2024. I provided approximately  60 minutes of face to face time during this encounter, including time spent before and after the visit in records review, medical decision making, counseling pertinent to today's visit, and charting.   I recommend at least 1 more week of Spravato .  If no change is noted at that time then we will stop treatment and recommend TMS.  We briefly discussed today.  Continue Xanax  1 mg, 2 p.o. nightly as needed. Continue Cymbalta   60 mg, 2 every day.  Continue Spravato  84 mg twice a week. Continue Cerafolin NAC, 1 daily. Continue Lunesta  3 mg, 1 p.o. nightly. Continue hydroxyzine  25 mg, 1 p.o. nightly as needed. Continue  Latuda   80 mg, 1 p.o. q. evening with food. Continue prazosin  3 mg, 1 p.o. nightly.   Continue therapy with Yolanda Bunde, LCSW. Return in 5 days.  Verneita Cooks, PA-C

## 2024-05-19 NOTE — Progress Notes (Signed)
 NURSES NOTE:         Pt arrived for her 10th Spravato  Treatment for treatment resistant depression, the starting dose was 56 mg (2 of the 28 mg nasal sprays), pt tolerated that dose well she will receive 84 mg today which will be her maintenance dose. Pt reports she did well after her first treatment and feels good to increase her dose today. Pt sees Verneita Cooks, [place] and she will follow up with her at each treatment. Explained to pt how the treatments would be scheduled and answered any questions she had today. She was given a practice nasal trainer to try a few times before given the one with medication. She verbalized understanding. Pt's Spravato  is billed through buy and bill medically.  Spravato  medication is stored at treatment center per REMS/FDA guidelines. The medication is required to be locked behind two doors per REMS/FDA protocol. Medication is also disposed of properly after each use per regulations. All documentation for REMS is completed and submitted per FDA/REMS requirements.          Began taking patient's vital signs at 2:09 PM 117/70, pulse 70. SpO2 Sat. 98%. Stable to proceed with treatment. Instructed patient to blow her nose if needed then recline back to a 45 degree angle. Gave patient first dose 28 mg nasal spray, administered in each nostril as directed and observed by nurse, waited 5 more minutes for the second and third dose. After all doses given pt did not complain of any nausea/vomiting, pt did have a drink to help with the taste of Spravato  it gives the metallic taste. Assessed her 40 minute vitals, 2:49 PM, 110/68, pulse 74, SpO2 95%. Pt needed to go to the bathroom once during her treatment.  Explained she would be monitored for a total time of 120 minutes. Discharge vitals were taken at 4:06 PM 108/75, P 80, SpO2 98%. Verneita came to visit with patient once her thoughts were clearer to discuss how treatment went. Recommend she go home and sleep or just relax on the  couch. No driving, no intense activities. Verbalized understanding. After discussion with Verneita they decided to continue her treatments twice a week for 2 additional week. Nurse was with pt a total of 60 minutes for clinical assessment. Pt will continue 84 mg for her treatments. Instructed to call with any issues prior to her apt next week on Monday and Wednesday.   (84 mg)  LOT [id number] EXP AUG 2028

## 2024-05-20 ENCOUNTER — Ambulatory Visit: Admitting: Physician Assistant

## 2024-05-20 ENCOUNTER — Ambulatory Visit

## 2024-05-20 ENCOUNTER — Encounter: Payer: Self-pay | Admitting: Physician Assistant

## 2024-05-20 VITALS — BP 129/79 | HR 64

## 2024-05-20 DIAGNOSIS — F5105 Insomnia due to other mental disorder: Secondary | ICD-10-CM | POA: Diagnosis not present

## 2024-05-20 DIAGNOSIS — F99 Mental disorder, not otherwise specified: Secondary | ICD-10-CM | POA: Diagnosis not present

## 2024-05-20 DIAGNOSIS — F518 Other sleep disorders not due to a substance or known physiological condition: Secondary | ICD-10-CM

## 2024-05-20 DIAGNOSIS — F411 Generalized anxiety disorder: Secondary | ICD-10-CM | POA: Diagnosis not present

## 2024-05-20 DIAGNOSIS — F329 Major depressive disorder, single episode, unspecified: Secondary | ICD-10-CM

## 2024-05-20 MED ORDER — MODAFINIL 200 MG PO TABS: 100.0000 mg | ORAL_TABLET | ORAL | 0 refills | Status: DC

## 2024-05-20 NOTE — Progress Notes (Signed)
 [facility]  Patient ID: Yolanda Wolfe,  MRN: [id number]  PCP: Jordan, Betty G, MD  Date of Evaluation: 05/20/2024 Time spent:65 minutes  Chief Complaint:  Chief Complaint   Depression; Other    HISTORY/CURRENT STATUS: HPI  For Spravato  treatment # 11  [name] had a hard day yesterday.  She stayed in bed longer than usual.  Had no motivation or energy.  I know once I get up and get going, I feel better.  But it is hard getting over that hump.  She wonders if there is any medication that could give her that push. So far she's not seeing any improvement with the Spravato , but today she felt the treatment more.  Hopefully this is a turning point for her.  Even before getting the treatment today she felt better with more energy and motivation.  ADLs are normal.  Appetite normal.  Personal hygiene is normal.  Work is going okay, she dreads going but once she gets there she is fine.  Anxiety is controlled.  She never takes the Xanax  during the day, only at night to help her sleep.  She has started having more nightmares but they are not as scary as they have been in the past.  She is taking prazosin  3 mg which has been helpful until recently.  No mania, delirium, or psychosis.  No suicidal or homicidal thoughts.  Individual Medical History/ Review of Systems: Changes? :No     Past medications for mental health diagnoses include: Amitriptylline for sleep? Celexa,  Zoloft , Cymbalta , Wellbutrin  caused migraines to worsen,  Lunesta , Ativan , Belsomra , Temazapam,  trazodone  not effective at low doses but at 100 mg she was too groggy, Mirtazapine  caused extreme wt gain, Sonata  ineffective, Ambien   Topamax  for migraines, somewhat effective  Allergies: Wellbutrin  [bupropion ]  Current Medications:  Current Outpatient Medications:    ALPRAZolam  (XANAX ) 1 MG tablet, TAKE 2 TABLETS (2 MG TOTAL) BY MOUTH AT BEDTIME, Disp: 60 tablet, Rfl: 5   Atogepant  (QULIPTA ) 60 MG TABS,  TAKE 1 TABLET BY MOUTH EVERY DAY, Disp: 30 tablet, Rfl: 0   botulinum toxin Type A  (BOTOX ) 200 units injection, Inject 155 units IM into multiple site in the face,neck and head once every 90 days, Disp: 1 each, Rfl: 4   DULoxetine  (CYMBALTA ) 60 MG capsule, Take 2 capsules (120 mg total) by mouth daily., Disp: 180 capsule, Rfl: 1   Esketamine HCl, 84 MG Dose, (SPRAVATO , 84 MG DOSE,) 28 MG/DEVICE SOPK, Place 84 mg into the nose every 3 (three) days., Disp: , Rfl:    estradiol (LYLLANA) 0.0375 MG/24HR, Apply 1 patch twice a week by transdermal route., Disp: , Rfl:    Eszopiclone  3 MG TABS, Take 1 tablet (3 mg total) by mouth at bedtime as needed., Disp: 30 tablet, Rfl: 5   hydrOXYzine  (ATARAX ) 25 MG tablet, TAKE 1 TABLET FOR SLEEP AS NEEDED, Disp: 90 tablet, Rfl: 1   lurasidone  (LATUDA ) 80 MG TABS tablet, Take 1 tablet (80 mg total) by mouth daily with breakfast., Disp: 90 tablet, Rfl: 1   Methylfol-Algae-B12-Acetylcyst (CEREFOLIN NAC) 6-90.314-2-600 MG TABS, Take 1 tablet by mouth daily., Disp: 30 tablet, Rfl: 11   modafinil  (PROVIGIL ) 200 MG tablet, Take 0.5-1 tablets (100-200 mg total) by mouth every morning., Disp: 30 tablet, Rfl: 0   NURTEC 75 MG TBDP, TAKE 1 TABLET (75 MG TOTAL) BY MOUTH DAILY AS NEEDED, Disp: 16 tablet, Rfl: 5   prazosin  (MINIPRESS ) 1 MG capsule, TAKE 3 CAPSULES (3 MG TOTAL)  BY MOUTH AT BEDTIME, Disp: 90 capsule, Rfl: 1   progesterone (PROMETRIUM) 100 MG capsule, Take 100 mg by mouth daily., Disp: , Rfl:    Tirzepatide (MOUNJARO [place]), Inject into the skin., Disp: , Rfl:    tretinoin (RETIN-A) 0.1 % cream, , Disp: , Rfl:  Medication Side Effects: H/A w/ Wellbutrin , Sexual dysfunction with Cymbalta    Family Medical/ Social History: Changes? no  MENTAL HEALTH EXAM:  There were no vitals taken for this visit.There is no height or weight on file to calculate BMI.  General Appearance: Casual and Well Groomed  Eye Contact:  Good  Speech:  Clear and Coherent and Normal Rate   Volume:  Normal  Mood:  Euthymic  Affect:  Looks happy, smiled several times, even smiling with her eyes  Thought Process:  Goal Directed and Descriptions of Associations: Circumstantial  Orientation:  Full (Time, Place, and Person)  Thought Content: Logical   Suicidal Thoughts:  No  Homicidal Thoughts:  No  Memory:  WNL  Judgement:  Good  Insight:  Good  Psychomotor Activity:  Normal  Concentration:  Concentration: Good and Attention Span: Good  Recall:  Good  Fund of Knowledge: Good  Language: Good  Assets:  Communication Skills Desire for Improvement Financial Resources/Insurance Housing Leisure Time Physical Health Resilience Social Support Transportation Vocational/Educational  ADL's:  Intact  Cognition: WNL  Prognosis:  Good   Dr. Jordan is following labs.   GeneSight test results are in chart under Labs from 08/05/2020, had moderately reduced folic acid conversion  Patient was administered Spravato  84 mg intranasally today.  The patient experienced the typical dissociation which gradually resolved over the 2-hour period of observation.  There were no complications.  Specifically the patient did not have nausea or vomiting or headache.  Blood pressures and pulse ox levels remained within normal ranges at the 40-minute and 2-hour follow-up intervals.  By the time the 2-hour observation period was met the patient was alert and oriented and able to exit without assistance.  See nursing note for further details.  ECT-MADRS    Flowsheet Row Office Visit from 02/27/2024 in [facility] Office Visit from 10/20/2023 in [facility] Office Visit from 08/31/2023 in [facility]  MADRS Total Score 34 17 38   GAD-7    Flowsheet Row Office Visit from 07/25/2022 in [facility] [place] Office Visit from 07/31/2020 in [facility]  Total GAD-7 Score 5 13    PHQ2-9    Flowsheet Row Office Visit from 07/25/2022 in [facility] [place] Office Visit from 03/30/2022 in [facility] [place] Video Visit from 02/25/2022 in [facility] [place] Office Visit from 07/31/2020 in [facility]  PHQ-2 Total Score 2 6 2 1   PHQ-9 Total Score 10 23 10 9    DIAGNOSES:    ICD-10-CM   1. Treatment-resistant depression  F32.9     2. Generalized anxiety disorder  F41.1     3. Insomnia due to other mental disorder  F51.05    F99     4. Abnormal dreams  F51.8       Receiving Psychotherapy: Yes with Marval Bunde, LCSW  RECOMMENDATIONS:  PDMP was reviewed.  Xanax  filled 05/03/2024.  Lunesta  filled 04/25/2024. I provided approximately  65 minutes of face to face time during this encounter, including time spent before and after the visit in records  review, medical decision making, counseling pertinent to today's visit, and charting.   We discussed the lack of energy and motivation.  I recommend adding modafinil.  Discussed the rationale behind that, and that insurance will not cover it without the diagnosis of narcolepsy which she does not have.  [facility]  Benefits, risk and side effects were discussed and she would like to try it.  I am very hopeful that she is at a turning point as well and the Spravato  is starting to be effective.  Will continue twice weekly.  Continue Xanax  1 mg, 2 p.o. nightly as needed. Continue Cymbalta   60 mg, 2 every day.  Continue Spravato  84 mg twice a week. Continue Cerafolin NAC, 1 daily. Continue Lunesta  3 mg, 1 p.o. nightly. Continue hydroxyzine  25 mg, 1 p.o. nightly as needed. Continue  Latuda   80 mg, 1 p.o. q. evening with food. Start modafinil 200 mg, 1/2-1 p.o. every morning.  She will start out with 1/2 pill but if it is not helpful then increase to 1 whole pill.  She understands. Continue prazosin  3 mg,  1 p.o. nightly.   Continue therapy with Marval Bunde, LCSW. Return in 2 days.  Verneita Cooks, PA-C

## 2024-05-20 NOTE — Progress Notes (Signed)
 NURSES NOTE:         Pt arrived for her 11th Spravato  Treatment for treatment resistant depression, the starting dose was 56 mg (2 of the 28 mg nasal sprays), pt tolerated that dose well she will receive 84 mg today which will be her maintenance dose. Pt reports she did well after her first treatment and feels good to increase her dose today. Pt sees Verneita Cooks, [place] and she will follow up with her at each treatment. Explained to pt how the treatments would be scheduled and answered any questions she had today. She was given a practice nasal trainer to try a few times before given the one with medication. She verbalized understanding. Pt's Spravato  is billed through buy and bill medically.  Spravato  medication is stored at treatment center per REMS/FDA guidelines. The medication is required to be locked behind two doors per REMS/FDA protocol. Medication is also disposed of properly after each use per regulations. All documentation for REMS is completed and submitted per FDA/REMS requirements.          Began taking patient's vital signs at 2:07 PM 130/77, pulse 65. SpO2 Sat. 98%. Stable to proceed with treatment. Instructed patient to blow her nose if needed then recline back to a 45 degree angle. Gave patient first dose 28 mg nasal spray, administered in each nostril as directed and observed by nurse, waited 5 more minutes for the second and third dose. After all doses given pt did not complain of any nausea/vomiting, pt did have a drink to help with the taste of Spravato  it gives the metallic taste. Assessed her 40 minute vitals, 2:50 PM, 125/77, pulse 73, SpO2 98%. Pt reports this is the strongest treatment she has felt, she reports flying. Pt needed to go to the bathroom once during her treatment.  Explained she would be monitored for a total time of 120 minutes. Discharge vitals were taken at 3:52 PM 129/79, P 64, SpO2 98%. Verneita came to visit with patient once her thoughts were clearer to discuss  how treatment went. Recommend she go home and sleep or just relax on the couch. No driving, no intense activities. Verbalized understanding. After discussion with Verneita they decided to continue her treatments twice a week for 2 additional week. Nurse was with pt a total of 60 minutes for clinical assessment. Pt will continue 84 mg for her treatments. Instructed to call with any issues prior to her apt next week on Monday and Wednesday.   (84 mg)  LOT [id number] EXP AUG 2028

## 2024-05-22 ENCOUNTER — Encounter: Payer: Self-pay | Admitting: Physician Assistant

## 2024-05-22 ENCOUNTER — Ambulatory Visit: Admitting: Physician Assistant

## 2024-05-22 ENCOUNTER — Ambulatory Visit

## 2024-05-22 VITALS — BP 120/81 | HR 69

## 2024-05-22 DIAGNOSIS — F99 Mental disorder, not otherwise specified: Secondary | ICD-10-CM

## 2024-05-22 DIAGNOSIS — F329 Major depressive disorder, single episode, unspecified: Secondary | ICD-10-CM

## 2024-05-22 DIAGNOSIS — F5105 Insomnia due to other mental disorder: Secondary | ICD-10-CM | POA: Diagnosis not present

## 2024-05-22 DIAGNOSIS — F411 Generalized anxiety disorder: Secondary | ICD-10-CM

## 2024-05-22 NOTE — Progress Notes (Signed)
 NURSES NOTE:         Pt arrived for her 12th Spravato  Treatment for treatment resistant depression, the starting dose was 56 mg (2 of the 28 mg nasal sprays), pt tolerated that dose well she will receive 84 mg today which will be her maintenance dose. Pt reports she did well after her first treatment and feels good to increase her dose today. Pt sees Verneita Cooks, [place] and she will follow up with her at each treatment. Explained to pt how the treatments would be scheduled and answered any questions she had today. She was given a practice nasal trainer to try a few times before given the one with medication. She verbalized understanding. Pt's Spravato  is billed through buy and bill medically.  Spravato  medication is stored at treatment center per REMS/FDA guidelines. The medication is required to be locked behind two doors per REMS/FDA protocol. Medication is also disposed of properly after each use per regulations. All documentation for REMS is completed and submitted per FDA/REMS requirements.          Began taking patient's vital signs at 2:12 PM 116/73, pulse 73. SpO2 Sat. 98%. Stable to proceed with treatment. Instructed patient to blow her nose if needed then recline back to a 45 degree angle. Gave patient first dose 28 mg nasal spray, administered in each nostril as directed and observed by nurse, waited 5 more minutes for the second and third dose. After all doses given pt did not complain of any nausea/vomiting, pt did have a drink to help with the taste of Spravato  it gives the metallic taste. Assessed her 40 minute vitals, 2:58 PM, 118/75, pulse 70, SpO2 98%.  Explained she would be monitored for a total time of 120 minutes. Discharge vitals were taken at 4:06 PM 120/81, P 69, SpO2 98%. Verneita came to visit with patient once her thoughts were clearer to discuss how treatment went. Recommend she go home and sleep or just relax on the couch. No driving, no intense activities. Verbalized  understanding. After discussion with Verneita they decided to continue her treatments twice a week until the first of January then will reassess. Nurse was with pt a total of 60 minutes for clinical assessment. Pt will continue 84 mg for her treatments. Instructed to call with any issues prior to her apt next week on Monday and Wednesday.   (84 mg)  LOT [id number] EXP AUG 2028

## 2024-05-22 NOTE — Progress Notes (Unsigned)
 [facility]  Patient ID: Yolanda Wolfe,  MRN: [id number]  PCP: Jordan, Betty G, MD  Date of Evaluation: 05/22/2024 Time spent:65 minutes  Chief Complaint:    HISTORY/CURRENT STATUS: HPI  For Spravato  treatment # 12  Started Modafinil 3rd day feeling 'nl'   Individual Medical History/ Review of Systems: Changes? :No     Past medications for mental health diagnoses include: Amitriptylline for sleep? Celexa,  Zoloft , Cymbalta , Wellbutrin  caused migraines to worsen,  Lunesta , Ativan , Belsomra , Temazapam,  trazodone  not effective at low doses but at 100 mg she was too groggy, Mirtazapine  caused extreme wt gain, Sonata  ineffective, Ambien   Topamax  for migraines, somewhat effective  Allergies: Wellbutrin  [bupropion ]  Current Medications:  Current Outpatient Medications:    ALPRAZolam  (XANAX ) 1 MG tablet, TAKE 2 TABLETS (2 MG TOTAL) BY MOUTH AT BEDTIME, Disp: 60 tablet, Rfl: 5   Atogepant  (QULIPTA ) 60 MG TABS, TAKE 1 TABLET BY MOUTH EVERY DAY, Disp: 30 tablet, Rfl: 0   botulinum toxin Type A  (BOTOX ) 200 units injection, Inject 155 units IM into multiple site in the face,neck and head once every 90 days, Disp: 1 each, Rfl: 4   DULoxetine  (CYMBALTA ) 60 MG capsule, Take 2 capsules (120 mg total) by mouth daily., Disp: 180 capsule, Rfl: 1   Esketamine HCl, 84 MG Dose, (SPRAVATO , 84 MG DOSE,) 28 MG/DEVICE SOPK, Place 84 mg into the nose every 3 (three) days., Disp: , Rfl:    estradiol (LYLLANA) 0.0375 MG/24HR, Apply 1 patch twice a week by transdermal route., Disp: , Rfl:    Eszopiclone  3 MG TABS, Take 1 tablet (3 mg total) by mouth at bedtime as needed., Disp: 30 tablet, Rfl: 5   hydrOXYzine  (ATARAX ) 25 MG tablet, TAKE 1 TABLET FOR SLEEP AS NEEDED, Disp: 90 tablet, Rfl: 1   lurasidone  (LATUDA ) 80 MG TABS tablet, Take 1 tablet (80 mg total) by mouth daily with breakfast., Disp: 90 tablet, Rfl: 1   Methylfol-Algae-B12-Acetylcyst (CEREFOLIN NAC) 6-90.314-2-600 MG TABS,  Take 1 tablet by mouth daily., Disp: 30 tablet, Rfl: 11   modafinil (PROVIGIL) 200 MG tablet, Take 0.5-1 tablets (100-200 mg total) by mouth every morning., Disp: 30 tablet, Rfl: 0   NURTEC 75 MG TBDP, TAKE 1 TABLET (75 MG TOTAL) BY MOUTH DAILY AS NEEDED, Disp: 16 tablet, Rfl: 5   prazosin  (MINIPRESS ) 1 MG capsule, TAKE 3 CAPSULES (3 MG TOTAL) BY MOUTH AT BEDTIME, Disp: 90 capsule, Rfl: 1   progesterone (PROMETRIUM) 100 MG capsule, Take 100 mg by mouth daily., Disp: , Rfl:    Tirzepatide (MOUNJARO [place]), Inject into the skin., Disp: , Rfl:    tretinoin (RETIN-A) 0.1 % cream, , Disp: , Rfl:  Medication Side Effects: H/A w/ Wellbutrin , Sexual dysfunction with Cymbalta    Family Medical/ Social History: Changes? no  MENTAL HEALTH EXAM:  There were no vitals taken for this visit.There is no height or weight on file to calculate BMI.  General Appearance: Casual and Well Groomed  Eye Contact:  Good  Speech:  Clear and Coherent and Normal Rate  Volume:  Normal  Mood:  Euthymic  Affect:  Looks happy, smiled several times, even smiling with her eyes  Thought Process:  Goal Directed and Descriptions of Associations: Circumstantial  Orientation:  Full (Time, Place, and Person)  Thought Content: Logical   Suicidal Thoughts:  No  Homicidal Thoughts:  No  Memory:  WNL  Judgement:  Good  Insight:  Good  Psychomotor Activity:  Normal  Concentration:  Concentration: Good  and Attention Span: Good  Recall:  Good  Fund of Knowledge: Good  Language: Good  Assets:  Communication Skills Desire for Improvement Financial Resources/Insurance Housing Leisure Time Physical Health Resilience Social Support Transportation Vocational/Educational  ADL's:  Intact  Cognition: WNL  Prognosis:  Good   Dr. Jordan is following labs.   GeneSight test results are in chart under Labs from 08/05/2020, had moderately reduced folic acid conversion  Patient was administered Spravato  84 mg intranasally today.  The  patient experienced the typical dissociation which gradually resolved over the 2-hour period of observation.  There were no complications.  Specifically the patient did not have nausea or vomiting or headache.  Blood pressures and pulse ox levels remained within normal ranges at the 40-minute and 2-hour follow-up intervals.  By the time the 2-hour observation period was met the patient was alert and oriented and able to exit without assistance.  See nursing note for further details.  ECT-MADRS    Flowsheet Row Office Visit from 02/27/2024 in [facility] Office Visit from 10/20/2023 in [facility] Office Visit from 08/31/2023 in [facility]  MADRS Total Score 34 17 38   GAD-7    Flowsheet Row Office Visit from 07/25/2022 in [facility] [place] [facility] [place] Office Visit from 07/31/2020 in [facility]  Total GAD-7 Score 5 13   PHQ2-9    Flowsheet Row Office Visit from 07/25/2022 in [facility] [place] [facility] [place] Office Visit from 03/30/2022 in [facility] [place] Video Visit from 02/25/2022 in [facility] [place] Office Visit from 07/31/2020 in [facility]  PHQ-2 Total Score 2 6 2 1   PHQ-9 Total Score 10 23 10 9    DIAGNOSES:  No diagnosis found.   Receiving Psychotherapy: Yes with Marval Bunde, LCSW  RECOMMENDATIONS:  PDMP was reviewed.  Modafinil  filled 05/20/2024.  Xanax  filled 05/03/2024.  Lunesta  filled 04/25/2024.       Continue Xanax  1 mg, 2 p.o. nightly as needed. Continue Cymbalta   60 mg, 2 every day.  Continue Spravato  84 mg twice a week. Continue Cerafolin NAC, 1 daily. Continue Lunesta  3 mg, 1 p.o. nightly. Continue hydroxyzine  25 mg, 1 p.o. nightly as needed. Continue  Latuda   80 mg, 1 p.o. q. evening with food.                  modafinil  200 mg, 1/2-1 p.o. every  morning.  She will start out with 1/2 pill but if it is not helpful then increase to 1 whole pill.  She understands. Continue prazosin  3 mg, 1 p.o. nightly.   Continue therapy with Marval Bunde, LCSW. Return in          [place], PA-C

## 2024-05-23 ENCOUNTER — Encounter: Payer: Self-pay | Admitting: Physician Assistant

## 2024-05-27 ENCOUNTER — Ambulatory Visit: Admitting: Physician Assistant

## 2024-05-27 ENCOUNTER — Encounter: Payer: Self-pay | Admitting: Physician Assistant

## 2024-05-27 ENCOUNTER — Ambulatory Visit

## 2024-05-27 VITALS — BP 123/74 | HR 79

## 2024-05-27 DIAGNOSIS — F411 Generalized anxiety disorder: Secondary | ICD-10-CM

## 2024-05-27 DIAGNOSIS — F329 Major depressive disorder, single episode, unspecified: Secondary | ICD-10-CM

## 2024-05-27 DIAGNOSIS — F99 Mental disorder, not otherwise specified: Secondary | ICD-10-CM

## 2024-05-27 NOTE — Progress Notes (Signed)
 [facility]  Patient ID: Yolanda Wolfe,  MRN: [id number]  PCP: Jordan, Betty G, MD  Date of Evaluation: 05/27/2024 Time spent:65 minutes  Chief Complaint:  Chief Complaint   Depression; Other    HISTORY/CURRENT STATUS: HPI  For Spravato  treatment, beginning of 7th week.  States she feels good now, like my old self is back!  She has had more energy and motivation and felt like doing things.  She is not dreading things like she did.  She still has a little trouble getting up in the mornings but having the modafinil  on board helps.  She sleeps pretty well with her current medications.  She sometimes wakes up but is able to go back to sleep quickly.  This is not a new problem.  ADLs and personal hygiene are normal.  Appetite is normal and weight is stable.  Anxiety is well-controlled.  No panic attacks reported.  No mania, delirium, psychosis, no suicidal or homicidal thoughts.  Individual Medical History/ Review of Systems: Changes? :No     Past medications for mental health diagnoses include: Amitriptylline for sleep? Celexa,  Zoloft , Cymbalta , Wellbutrin  caused migraines to worsen,  Lunesta , Ativan , Belsomra , Temazapam,  trazodone  not effective at low doses but at 100 mg she was too groggy, Mirtazapine  caused extreme wt gain, Sonata  ineffective, Ambien   Topamax  for migraines, somewhat effective  Allergies: Wellbutrin  [bupropion ]  Current Medications:  Current Outpatient Medications:    ALPRAZolam  (XANAX ) 1 MG tablet, TAKE 2 TABLETS (2 MG TOTAL) BY MOUTH AT BEDTIME, Disp: 60 tablet, Rfl: 5   Atogepant  (QULIPTA ) 60 MG TABS, TAKE 1 TABLET BY MOUTH EVERY DAY, Disp: 30 tablet, Rfl: 0   botulinum toxin Type A  (BOTOX ) 200 units injection, Inject 155 units IM into multiple site in the face,neck and head once every 90 days, Disp: 1 each, Rfl: 4   DULoxetine  (CYMBALTA ) 60 MG capsule, Take 2 capsules (120 mg total) by mouth daily., Disp: 180 capsule, Rfl: 1    Esketamine HCl, 84 MG Dose, (SPRAVATO , 84 MG DOSE,) 28 MG/DEVICE SOPK, Place 84 mg into the nose every 3 (three) days., Disp: , Rfl:    estradiol (LYLLANA) 0.0375 MG/24HR, Apply 1 patch twice a week by transdermal route., Disp: , Rfl:    Eszopiclone  3 MG TABS, Take 1 tablet (3 mg total) by mouth at bedtime as needed., Disp: 30 tablet, Rfl: 5   hydrOXYzine  (ATARAX ) 25 MG tablet, TAKE 1 TABLET FOR SLEEP AS NEEDED, Disp: 90 tablet, Rfl: 1   lurasidone  (LATUDA ) 80 MG TABS tablet, Take 1 tablet (80 mg total) by mouth daily with breakfast., Disp: 90 tablet, Rfl: 1   Methylfol-Algae-B12-Acetylcyst (CEREFOLIN NAC) 6-90.314-2-600 MG TABS, Take 1 tablet by mouth daily., Disp: 30 tablet, Rfl: 11   modafinil  (PROVIGIL ) 200 MG tablet, Take 0.5-1 tablets (100-200 mg total) by mouth every morning., Disp: 30 tablet, Rfl: 0   NURTEC 75 MG TBDP, TAKE 1 TABLET (75 MG TOTAL) BY MOUTH DAILY AS NEEDED, Disp: 16 tablet, Rfl: 5   prazosin  (MINIPRESS ) 1 MG capsule, TAKE 3 CAPSULES (3 MG TOTAL) BY MOUTH AT BEDTIME, Disp: 90 capsule, Rfl: 1   progesterone (PROMETRIUM) 100 MG capsule, Take 100 mg by mouth daily., Disp: , Rfl:    Tirzepatide (MOUNJARO [place]), Inject into the skin., Disp: , Rfl:    tretinoin (RETIN-A) 0.1 % cream, , Disp: , Rfl:  Medication Side Effects: H/A w/ Wellbutrin , Sexual dysfunction with Cymbalta    Family Medical/ Social History: Changes? no  MENTAL HEALTH  EXAM:  There were no vitals taken for this visit.There is no height or weight on file to calculate BMI.  General Appearance: Casual and Well Groomed  Eye Contact:  Good  Speech:  Clear and Coherent and Normal Rate  Volume:  Normal  Mood:  Euthymic  Affect:  Congruent and and smiling  Thought Process:  Goal Directed and Descriptions of Associations: Circumstantial  Orientation:  Full (Time, Place, and Person)  Thought Content: Logical   Suicidal Thoughts:  No  Homicidal Thoughts:  No  Memory:  WNL  Judgement:  Good  Insight:  Good   Psychomotor Activity:  Normal  Concentration:  Concentration: Good and Attention Span: Good  Recall:  Good  Fund of Knowledge: Good  Language: Good  Assets:  Communication Skills Desire for Improvement Financial Resources/Insurance Housing Leisure Time Physical Health Resilience Social Support Transportation Vocational/Educational  ADL's:  Intact  Cognition: WNL  Prognosis:  Good   Dr. Jordan is following labs.   GeneSight test results are in chart under Labs from 08/05/2020, had moderately reduced folic acid conversion  Patient was administered Spravato  84 mg intranasally today.  The patient experienced the typical dissociation which gradually resolved over the 2-hour period of observation.  There were no complications.  Specifically the patient did not have nausea or vomiting or headache.  Blood pressures remained within normal ranges at the 40-minute and 2-hour follow-up intervals.  By the time the 2-hour observation period was met the patient was alert and oriented and able to exit without assistance.  Patient feels the Spravato  administration is helpful for the treatment resistant depression and would like to continue the treatment.  See nursing note for further details.  ECT-MADRS    Flowsheet Row Clinical Support from 05/22/2024 in [facility] Office Visit from 02/27/2024 in [facility] Office Visit from 10/20/2023 in [facility] Office Visit from 08/31/2023 in [facility]  MADRS Total Score 19 34 17 38   GAD-7    Flowsheet Row Office Visit from 07/25/2022 in [facility] [place] Office Visit from 07/31/2020 in [facility]  Total GAD-7 Score 5 13   PHQ2-9    Flowsheet Row Office Visit from 07/25/2022 in [facility] [place] Office Visit from 03/30/2022 in [facility] [place] [facility]  [place] Video Visit from 02/25/2022 in [facility] [place] Office Visit from 07/31/2020 in [facility]  PHQ-2 Total Score 2 6 2 1   PHQ-9 Total Score 10 23 10 9    DIAGNOSES:    ICD-10-CM   1. Treatment-resistant depression  F32.9     2. Generalized anxiety disorder  F41.1     3. Insomnia due to other mental disorder  F51.05    F99       Receiving Psychotherapy: Yes with Marval Bunde, LCSW  RECOMMENDATIONS:  PDMP was reviewed.  Modafinil  filled 05/20/2024.  Xanax  filled 05/03/2024.  Lunesta  filled 04/25/2024. I provided approximately 65 minutes of face to face time during this encounter, including time spent before and after the visit in records review, medical decision making, counseling pertinent to today's visit, and charting.   We are both happy to see her positive response to the current treatment!  Our plan will be to continue twice weekly Spravato  treatments along with her other medications and reassess in mid January.  Continue Xanax  1 mg, 2 p.o. nightly as needed.  Continue Cymbalta   60 mg, 2 every day.  Continue Spravato  84 mg twice a week. Continue Cerafolin NAC, 1 daily. Continue Lunesta  3 mg, 1 p.o. nightly. Continue hydroxyzine  25 mg, 1 p.o. nightly as needed. Continue  Latuda   80 mg, 1 p.o. q. evening with food. Continue modafinil  200 mg, 1/2-1 p.o. every morning. Continue prazosin  3 mg, 1 p.o. nightly.   Continue therapy with Marval Bunde, LCSW. Return in 2 days.         Verneita Cooks, PA-C

## 2024-05-28 ENCOUNTER — Ambulatory Visit: Admitting: Psychiatry

## 2024-05-28 DIAGNOSIS — F329 Major depressive disorder, single episode, unspecified: Secondary | ICD-10-CM | POA: Diagnosis not present

## 2024-05-28 NOTE — Progress Notes (Signed)
      [facility]or[facility]Therapist Progress Note  Patient ID: Yolanda Wolfe, MRN: [id number],    Date: 05/28/2024  Time Spent: 53 minutes   Treatment Type: Individual Therapy  Reported Symptoms:   Anxiety decreased, depression decreased, motivation improving    Mental Status Exam:  Appearance:   Casual and Neat     Behavior:  Appropriate and Sharing  Motor:  Normal  Speech/Language:   Clear and Coherent  Affect:  Anxious and depressed but improving  Mood:  Anxious and depressed but improving  Thought process:  goal directed  Thought content:    WNL  Sensory/Perceptual disturbances:    WNL  Orientation:  oriented to person, place, time/date, situation, day of week, month of year, year, and stated date of Dec. 9, 2025  Attention:  Good  Concentration:  Good  Memory:  WNL  Fund of knowledge:   Good  Insight:    Good  Judgment:   Good  Impulse Control:  Good   Risk Assessment: Danger to Self:  No Self-injurious Behavior: No Danger to Others: No Duty to Warn:no Physical Aggression / Violence:No  Access to Firearms a concern: No  Gang Involvement:No   Subjective: Patient today reporting anxiety and depression improving some, along with her motivation. Has been treated more recently with Spravato  and showing some improvement after 7 weeks. Feeling noticeably better and is hopeful her improvement will continue. Doesn't dread going to work more recently and feeling more energy except first thing in the morning when I first get up.  Really feeling some better and that is encouraging. Notices that when I have something planned, it's easier to get up, but when I don't have plans it is harder. Some difficulties in follow through with household tasks but is improving gradually. Focusing on my gratitude list helps. Husband is encouraging and helpful. Feeling like I'm staying more on a good track. Less dreading of things and tasks. Less jumping to conclusions. Good  feeling and thoughts re: family. Self doubt decreasing some. Some sleep irregularity and is working this. Feeling Spravato  is helping and hoping it continues.   Interventions: Cognitive Behavioral Therapy, Solution-Oriented/Positive Psychology, and Ego-Supportive  Identify life conflicts and/or situations including from her past and in the present, that support her current symptomology of anxiety and depression. Develop behavioral and cognitive strategies to reduce or eliminate excessive anxiety and depression.  Identify, challenge, and replace negative self talk with more positive, realistic, and empowering self talk. Patient will develop healthier self talk as a means of feeling better about herself and better managing her anxiety and depression. Patient to increase daily social experiences and work on strengthening a new non-avoidant approach and build self-confidence. Patient to work on developing reality based, positive cognitive messages.   Diagnosis:   ICD-10-CM   1. Treatment-resistant depression  F32.9      Plan:  Patient showing better motivation along with her current response to Spravato  medication. Does feel more positive about the future and this has been short-term thus far and hoping this will continue. Trying to see more of the positives and fewer negatives.   Goal review and progress/challenges noted with patient.  Next appt within 3-4 weeks.    Barnie Bunde, LCSW

## 2024-05-28 NOTE — Progress Notes (Signed)
 NURSES NOTE:         Pt arrived for her 13th Spravato  Treatment for treatment resistant depression, the starting dose was 56 mg (2 of the 28 mg nasal sprays), pt tolerated that dose well she will receive 84 mg today which will be her maintenance dose. Pt reports she did well after her first treatment and feels good to increase her dose today. Pt sees Verneita Cooks, [place] and she will follow up with her at each treatment. Explained to pt how the treatments would be scheduled and answered any questions she had today. She was given a practice nasal trainer to try a few times before given the one with medication. She verbalized understanding. Pt's Spravato  is billed through buy and bill medically.  Spravato  medication is stored at treatment center per REMS/FDA guidelines. The medication is required to be locked behind two doors per REMS/FDA protocol. Medication is also disposed of properly after each use per regulations. All documentation for REMS is completed and submitted per FDA/REMS requirements.          Began taking patient's vital signs at 12:45 PM 123/74, pulse 79. SpO2 Sat. 99%. Stable to proceed with treatment. Instructed patient to blow her nose if needed then recline back to a 45 degree angle. Gave patient first dose 28 mg nasal spray, administered in each nostril as directed and observed by nurse, waited 5 more minutes for the second and third dose. After all doses given pt did not complain of any nausea/vomiting, pt did have a drink to help with the taste of Spravato  it gives the metallic taste. Assessed her 40 minute vitals, 1:25 PM, 112/75, pulse 74, SpO2 96%.  Explained she would be monitored for a total time of 120 minutes. Discharge vitals were taken at 2:31 PM 107/71, P 72, SpO2 98%. Verneita came to visit with patient once her thoughts were clearer to discuss how treatment went. Recommend she go home and sleep or just relax on the couch. No driving, no intense activities. Verbalized  understanding. Nurse was with pt a total of 60 minutes for clinical assessment. Pt will continue 84 mg for her treatments. Instructed to call with any issues prior to her next apt on Monday and Wednesday.   (84 mg)  LOT [id number] EXP AUG 2028

## 2024-05-29 ENCOUNTER — Ambulatory Visit: Admitting: Physician Assistant

## 2024-05-29 ENCOUNTER — Ambulatory Visit

## 2024-05-29 ENCOUNTER — Encounter: Payer: Self-pay | Admitting: Physician Assistant

## 2024-05-29 VITALS — BP 130/79 | HR 74

## 2024-05-29 DIAGNOSIS — F411 Generalized anxiety disorder: Secondary | ICD-10-CM

## 2024-05-29 DIAGNOSIS — F329 Major depressive disorder, single episode, unspecified: Secondary | ICD-10-CM | POA: Diagnosis not present

## 2024-05-29 DIAGNOSIS — F5105 Insomnia due to other mental disorder: Secondary | ICD-10-CM

## 2024-05-29 NOTE — Progress Notes (Signed)
 NURSES NOTE:         Pt arrived for her 14th Spravato  Treatment for treatment resistant depression, the starting dose was 56 mg (2 of the 28 mg nasal sprays), pt tolerated that dose well she will receive 84 mg today which will be her maintenance dose. Pt reports she did well after her first treatment and feels good to increase her dose today. Pt sees Verneita Cooks, [place] and she will follow up with her at each treatment. Explained to pt how the treatments would be scheduled and answered any questions she had today. She was given a practice nasal trainer to try a few times before given the one with medication. She verbalized understanding. Pt's Spravato  is billed through buy and bill medically.  Spravato  medication is stored at treatment center per REMS/FDA guidelines. The medication is required to be locked behind two doors per REMS/FDA protocol. Medication is also disposed of properly after each use per regulations. All documentation for REMS is completed and submitted per FDA/REMS requirements.          Began taking patient's vital signs at 3:10 PM 125/77, pulse 68. SpO2 Sat. 99%. Stable to proceed with treatment. Instructed patient to blow her nose if needed then recline back to a 45 degree angle. Gave patient first dose 28 mg nasal spray, administered in each nostril as directed and observed by nurse, waited 5 more minutes for the second and third dose. After all doses given pt did not complain of any nausea/vomiting, pt did have a drink to help with the taste of Spravato  it gives the metallic taste. Assessed her 40 minute vitals, 3:50 PM, 126/75, pulse 66, SpO2 98%.  Explained she would be monitored for a total time of 120 minutes. Discharge vitals were taken at 4:59 PM 130/79, P 74, SpO2 98%. Verneita came to visit with patient once her thoughts were clearer to discuss how treatment went. Recommend she go home and sleep or just relax on the couch. No driving, no intense activities. Verbalized  understanding. Nurse was with pt a total of 60 minutes for clinical assessment. Pt will continue 84 mg for her treatments. Instructed to call with any issues prior to her next apt on Monday.   (84 mg)  LOT [id number] EXP AUG 2028

## 2024-05-29 NOTE — Progress Notes (Signed)
 [facility]  Patient ID: Caitrin Pendergraph,  MRN: [id number]  PCP: Jordan, Betty G, MD  Date of Evaluation: 05/29/2024 Time spent:60 minutes  Chief Complaint:  Chief Complaint   Depression; Other    HISTORY/CURRENT STATUS: HPI  For Spravato  treatment  Mayia is still feeling better! She and her family are happy that she's responding so well now.  Energy and motivation are better, she doesn't dread going to work or anywhere else like she did.  Work is going well.   No extreme sadness.  Sleeps ok with current meds.  ADLs and personal hygiene are normal.   Denies any changes in concentration, making decisions, or remembering things.  Appetite has not changed.  No worsening of anxiety.  No mania, delirium, AH/VH.  No SI/HI.  Individual Medical History/ Review of Systems: Changes? :No     Past medications for mental health diagnoses include: Amitriptylline for sleep? Celexa,  Zoloft , Cymbalta , Wellbutrin  caused migraines to worsen,  Lunesta , Ativan , Belsomra , Temazapam,  trazodone  not effective at low doses but at 100 mg she was too groggy, Mirtazapine  caused extreme wt gain, Sonata  ineffective, Ambien   Topamax  for migraines, somewhat effective  Allergies: Wellbutrin  [bupropion ]  Current Medications:  Current Outpatient Medications:    ALPRAZolam  (XANAX ) 1 MG tablet, TAKE 2 TABLETS (2 MG TOTAL) BY MOUTH AT BEDTIME, Disp: 60 tablet, Rfl: 5   Atogepant  (QULIPTA ) 60 MG TABS, TAKE 1 TABLET BY MOUTH EVERY DAY, Disp: 30 tablet, Rfl: 0   botulinum toxin Type A  (BOTOX ) 200 units injection, Inject 155 units IM into multiple site in the face,neck and head once every 90 days, Disp: 1 each, Rfl: 4   DULoxetine  (CYMBALTA ) 60 MG capsule, Take 2 capsules (120 mg total) by mouth daily., Disp: 180 capsule, Rfl: 1   Esketamine HCl, 84 MG Dose, (SPRAVATO , 84 MG DOSE,) 28 MG/DEVICE SOPK, Place 84 mg into the nose every 3 (three) days., Disp: , Rfl:    estradiol (LYLLANA) 0.0375  MG/24HR, Apply 1 patch twice a week by transdermal route., Disp: , Rfl:    Eszopiclone  3 MG TABS, Take 1 tablet (3 mg total) by mouth at bedtime as needed., Disp: 30 tablet, Rfl: 5   hydrOXYzine  (ATARAX ) 25 MG tablet, TAKE 1 TABLET FOR SLEEP AS NEEDED, Disp: 90 tablet, Rfl: 1   lurasidone  (LATUDA ) 80 MG TABS tablet, Take 1 tablet (80 mg total) by mouth daily with breakfast., Disp: 90 tablet, Rfl: 1   Methylfol-Algae-B12-Acetylcyst (CEREFOLIN NAC) 6-90.314-2-600 MG TABS, Take 1 tablet by mouth daily., Disp: 30 tablet, Rfl: 11   modafinil  (PROVIGIL ) 200 MG tablet, Take 0.5-1 tablets (100-200 mg total) by mouth every morning., Disp: 30 tablet, Rfl: 0   NURTEC 75 MG TBDP, TAKE 1 TABLET (75 MG TOTAL) BY MOUTH DAILY AS NEEDED, Disp: 16 tablet, Rfl: 5   prazosin  (MINIPRESS ) 1 MG capsule, TAKE 3 CAPSULES (3 MG TOTAL) BY MOUTH AT BEDTIME, Disp: 90 capsule, Rfl: 1   progesterone (PROMETRIUM) 100 MG capsule, Take 100 mg by mouth daily., Disp: , Rfl:    Tirzepatide (MOUNJARO [place]), Inject into the skin., Disp: , Rfl:    tretinoin (RETIN-A) 0.1 % cream, , Disp: , Rfl:  Medication Side Effects: H/A w/ Wellbutrin , Sexual dysfunction with Cymbalta    Family Medical/ Social History: Changes? no  MENTAL HEALTH EXAM:  There were no vitals taken for this visit.There is no height or weight on file to calculate BMI.  General Appearance: Casual and Well Groomed  Eye Contact:  Good  Speech:  Clear and Coherent and Normal Rate  Volume:  Normal  Mood:  Euthymic  Affect:  Congruent and and smiling  Thought Process:  Goal Directed and Descriptions of Associations: Circumstantial  Orientation:  Full (Time, Place, and Person)  Thought Content: Logical   Suicidal Thoughts:  No  Homicidal Thoughts:  No  Memory:  WNL  Judgement:  Good  Insight:  Good  Psychomotor Activity:  Normal  Concentration:  Concentration: Good and Attention Span: Good  Recall:  Good  Fund of Knowledge: Good  Language: Good  Assets:   Communication Skills Desire for Improvement Financial Resources/Insurance Housing Leisure Time Resilience Social Support Transportation Vocational/Educational  ADL's:  Intact  Cognition: WNL  Prognosis:  Good   Dr. Jordan is following labs.   GeneSight test results are in chart under Labs from 08/05/2020, had moderately reduced folic acid conversion  Patient was administered Spravato  84 mg intranasally today.  The patient experienced the typical dissociation which gradually resolved over the 2-hour period of observation.  There were no complications.  Specifically the patient did not have nausea or vomiting or headache.  Blood pressures remained within normal ranges at the 40-minute and 2-hour follow-up intervals.  By the time the 2-hour observation period was met the patient was alert and oriented and able to exit without assistance.  Patient feels the Spravato  administration is helpful for the treatment resistant depression and would like to continue the treatment.  See nursing note for further details.   ECT-MADRS    Flowsheet Row Clinical Support from 05/22/2024 in [facility] Office Visit from 02/27/2024 in [facility] Office Visit from 10/20/2023 in [facility] Office Visit from 08/31/2023 in [facility]  MADRS Total Score 19 34 17 38   GAD-7    Flowsheet Row Office Visit from 07/25/2022 in [facility] [place] Office Visit from 07/31/2020 in [facility]  Total GAD-7 Score 5 13   PHQ2-9    Flowsheet Row Office Visit from 07/25/2022 in [facility] [place] Office Visit from 03/30/2022 in [facility] [place] Video Visit from 02/25/2022 in [facility] [place] Office Visit from 07/31/2020 in [facility]  PHQ-2 Total Score 2 6 2 1    PHQ-9 Total Score 10 23 10 9    DIAGNOSES:    ICD-10-CM   1. Treatment-resistant depression  F32.9     2. Generalized anxiety disorder  F41.1     3. Insomnia due to other mental disorder  F51.05    F99       Receiving Psychotherapy: Yes with Marval Bunde, LCSW  RECOMMENDATIONS:  PDMP was reviewed.  Modafinil  filled 05/20/2024.  Xanax  filled 05/03/2024.  Lunesta  filled 05/26/2024. I provided approximately  60 minutes of face to face time during this encounter, including time spent before and after the visit in records review, medical decision making, counseling pertinent to today's visit, and charting.   She's doing well so no changes are needed.   Continue Xanax  1 mg, 2 p.o. nightly as needed. Continue Cymbalta   60 mg, 2 every day.  Continue Spravato  84 mg twice a week. Continue Cerafolin NAC, 1 daily. Continue Lunesta  3 mg, 1 p.o. nightly. Continue hydroxyzine  25 mg, 1 p.o. nightly as needed. Continue  Latuda   80 mg, 1 p.o. q. evening with food. Continue modafinil  200 mg, 1/2-1 p.o. every morning. Continue  prazosin  3 mg, 1 p.o. nightly.   Continue therapy with Marval Bunde, LCSW. Return in 5 days.         Verneita Cooks, PA-C

## 2024-05-30 ENCOUNTER — Other Ambulatory Visit: Payer: Self-pay | Admitting: Neurology

## 2024-05-31 NOTE — Progress Notes (Signed)
 NEUROLOGY FOLLOW UP OFFICE NOTE  Yolanda Wolfe [id number]  Assessment/Plan:   Migraine without aura without status migrainosus, not intractable  Idiopathic intracranial hypertension Tic vs tremor, possibly anxiety-related vs secondary to acetazolamide  (less likely)  Migraine prevention:  No change in management.  Botox , Qulipta  60mg  daily (she would rather avoid injections) Migraine rescue:  Nurtec.  Zofran  for nausea  Has upcoming appointment with [facility]. Limit use of pain relievers to no more than 2 days out of week to prevent risk of rebound or medication-overuse headache. Keep headache diary Follow up 6 months.  Subjective:  Yolanda Wolfe is a 48 year old right-handed female with depression and anxiety who follows up for migraines.   UPDATE: Migraines: Mostly well but had a couple of months where they were increased. Intensity:  mostly mild to moderate Duration:  1 hour with Nurtec when effective  Frequency: September 7 days, October 4 days, November 12 days (she attributes to starting Spravato ); Dec (first 2 weeks) 4  IIH: She had a repeat eye exam at [facility] on 5/29 which demonstrated mild nasal disc elevation but no edema, normal HVF and stable OCT RNFL.  Discontinued acetazolamide .  She has a follow up scheduled.    Tics/tremor: Earlier this year, she noted that her hands and legs will start shaking (left worse than right).  When she eats with utensils, hand may start to shake.  Prior to onset of symptoms, she did report increased anxiety, but it has been improving.  Labs in April revealed unremarkable CMP and TSH 1.30.  They have improved.    Current medications: Frequency of abortive medication: 7 days this month. Current NSAIDS/analgesics:  Excedrin Current triptans:  none Current ergotamine:  none Current anti-emetic:  Zofran  ODT 4mg  Current muscle relaxants:  none Current Antihypertensive medications:   none Current Antidepressant/antipsychotic medications:  Duloxetine  120mg  daily Current Anticonvulsant medications:  none Current anti-CGRP:  Nurtec PRN, Qulipta  60mg  daily Current Antihistamines/Decongestants:  none Other therapy:  Botox  Hormone/birth control:  none Other medications:  Acetazolamide  187.5mg  BID, alprazolam  2mg  at bedtime, Latuda    Caffeine:  Sweet tea.  No coffee Diet:  80 oz water daily.  Opto diet.  Does not skip meals.  Optavia Diet. Exercise:  no Depression:  yes; Anxiety:  yes Other pain:  no Sleep:  Poor.  Lorazepam  helps her stay asleep.  Wakes up tired.  Sleep apnea ruled out.    HISTORY:  Migraines: Headaches since childhood.  They are severe diffuse but primarily bi-temporal pain with associated nausea, photophobia, phonophobia but no associated vomiting, visual disturbance, autonomic symptoms, numbness or weakness.  They have progressively gotten worse over the past year from every 2 days-every 2 weeks to daily.  She reports increased stress and anxiety.  They last hours to 2 days.  Previously would occur every 2 days to every 2 weeks.  No explainable trigger.  No known relieving factors.  She has been taking Excedrin almost daily.     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   Idiopathic intracranial hypertension: Reports tinnitus in right ear.  Audiometric testing reportedly normal.  She started experiencing right sided pulsatile tinnitus around December 2022.  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  Denies visual obscurations or blurred vision.  Referred to ophthalmology.  Exam in July 2023 showed questionable papilledema with  mildly elevated and hyperemic optic discs but sharp margins.  LP on 01/19/2022 demonstrated a borderline elevated opening pressure of 23 cm water.  MRV of head on 02/04/2022 showed wasting at the bilateral transverse sigmoid junctions.  She was started on acetazolamide  250mg  twice daily.  Repeat eye exam at [facility] on 07/18/2022 revealed no optic disc or optic nerve swelling and stable OCT RNFL.     Past medications: Past NSAIDS/analgesics:  Ibuprofen , Tylenol  Past abortive triptans:  rizatriptan , sumatriptan  tab Past abortive ergotamine:  none Past muscle relaxants:  none Past anti-emetic:  none Past antihypertensive medications:  none Past antidepressant medications:  Amitriptyline , sertraline , fluoxetine , Latuda  Past anticonvulsant medications:  topiramate  Past anti-CGRP:  Aimovig  140mg , Ubrelvy  100mg  Past vitamins/Herbal/Supplements:  none Past antihistamines/decongestants:  none Other past therapies:  none     Family history of headache:  No  PAST MEDICAL HISTORY: Past Medical History:  Diagnosis Date   Anxiety    Blood transfusion without reported diagnosis    Depression    Frequent headaches    GERD (gastroesophageal reflux disease)    Heart murmur    Migraine    UTI (lower urinary tract infection)     MEDICATIONS: Current Outpatient Medications on File Prior to Visit  Medication Sig Dispense Refill   ALPRAZolam  (XANAX ) 1 MG tablet TAKE 2 TABLETS (2 MG TOTAL) BY MOUTH AT BEDTIME 60 tablet 5   Atogepant  (QULIPTA ) 60 MG TABS TAKE 1 TABLET BY MOUTH EVERY DAY 30 tablet 0   botulinum toxin Type A  (BOTOX ) 200 units injection Inject 155 units IM into multiple site in the face,neck and head once every 90 days 1 each 4   DULoxetine  (CYMBALTA ) 60 MG capsule Take 2 capsules (120 mg total) by mouth daily. 180 capsule 1   Esketamine HCl, 84 MG Dose, (SPRAVATO , 84 MG DOSE,) 28 MG/DEVICE SOPK Place 84 mg into the nose every 3 (three) days.     estradiol (LYLLANA) 0.0375 MG/24HR Apply 1  patch twice a week by transdermal route.     Eszopiclone  3 MG TABS Take 1 tablet (3 mg total) by mouth at bedtime as needed. 30 tablet 5   hydrOXYzine  (ATARAX ) 25 MG tablet TAKE 1 TABLET FOR SLEEP AS NEEDED 90 tablet 1   lurasidone  (LATUDA ) 80 MG TABS tablet Take 1 tablet (80 mg total) by mouth daily with breakfast. 90 tablet 1   Methylfol-Algae-B12-Acetylcyst (CEREFOLIN NAC) 6-90.314-2-600 MG TABS Take 1 tablet by mouth daily. 30 tablet 11   modafinil  (PROVIGIL ) 200 MG tablet Take 0.5-1 tablets (100-200 mg total) by mouth every morning. 30 tablet 0   NURTEC 75 MG TBDP TAKE 1 TABLET (75 MG TOTAL) BY MOUTH DAILY AS NEEDED 16 tablet 5   prazosin  (MINIPRESS ) 1 MG capsule TAKE 3 CAPSULES (3 MG TOTAL) BY MOUTH AT BEDTIME 90 capsule 1   progesterone (PROMETRIUM) 100 MG capsule Take 100 mg by mouth daily.     Tirzepatide (MOUNJARO [place]) Inject into the skin.     tretinoin (RETIN-A) 0.1 % cream      No current facility-administered medications on file prior to visit.    ALLERGIES: Allergies  Allergen Reactions   Wellbutrin  [Bupropion ] Other (See Comments)    Worsened migraines    FAMILY HISTORY: Family History  Problem Relation Age of Onset   Cancer Mother  neuroendocrine cancer   Cancer Father        skin   Healthy Daughter    Breast cancer Maternal Grandmother 35   Heart disease Maternal Grandfather    Dementia Paternal Grandmother    Alcohol abuse Paternal Grandfather       Objective:  Blood pressure 112/78, pulse (!) 117, height 5' 4 (1.626 m), weight 162 lb 12.8 oz (73.8 kg), SpO2 99%.  General: No acute distress.  Patient appears well-groomed.      Juliene Dunnings, DO  CC: Betty Jordan, MD

## 2024-06-03 ENCOUNTER — Ambulatory Visit: Admitting: Neurology

## 2024-06-03 ENCOUNTER — Ambulatory Visit

## 2024-06-03 ENCOUNTER — Telehealth: Payer: Self-pay | Admitting: Neurology

## 2024-06-03 ENCOUNTER — Ambulatory Visit: Admitting: Physician Assistant

## 2024-06-03 ENCOUNTER — Encounter: Payer: Self-pay | Admitting: Neurology

## 2024-06-03 VITALS — BP 109/69 | HR 86

## 2024-06-03 DIAGNOSIS — F411 Generalized anxiety disorder: Secondary | ICD-10-CM

## 2024-06-03 DIAGNOSIS — F329 Major depressive disorder, single episode, unspecified: Secondary | ICD-10-CM

## 2024-06-03 DIAGNOSIS — G43709 Chronic migraine without aura, not intractable, without status migrainosus: Secondary | ICD-10-CM | POA: Diagnosis not present

## 2024-06-03 DIAGNOSIS — E7212 Methylenetetrahydrofolate reductase deficiency: Secondary | ICD-10-CM

## 2024-06-03 DIAGNOSIS — F5105 Insomnia due to other mental disorder: Secondary | ICD-10-CM

## 2024-06-03 MED ORDER — QULIPTA 60 MG PO TABS: 1.0000 | ORAL_TABLET | Freq: Every day | ORAL | 11 refills | Status: AC

## 2024-06-03 MED ORDER — NURTEC 75 MG PO TBDP: 1.0000 | ORAL_TABLET | Freq: Every day | ORAL | 11 refills | Status: AC | PRN

## 2024-06-03 NOTE — Progress Notes (Signed)
 NURSES NOTE:         Pt arrived for her 15th Spravato  Treatment for treatment resistant depression, the starting dose was 56 mg (2 of the 28 mg nasal sprays), pt tolerated that dose well she will receive 84 mg today which will be her maintenance dose. Pt reports she did well after her first treatment and feels good to increase her dose today. Pt sees Verneita Cooks, [place] and she will follow up with her at each treatment. Explained to pt how the treatments would be scheduled and answered any questions she had today. She was given a practice nasal trainer to try a few times before given the one with medication. She verbalized understanding. Pt's Spravato  is billed through buy and bill medically.  Spravato  medication is stored at treatment center per REMS/FDA guidelines. The medication is required to be locked behind two doors per REMS/FDA protocol. Medication is also disposed of properly after each use per regulations. All documentation for REMS is completed and submitted per FDA/REMS requirements.          Began taking patient's vital signs at 2:15 PM 113/73, pulse 82. SpO2 Sat. 98%. Stable to proceed with treatment. Instructed patient to blow her nose if needed then recline back to a 45 degree angle. Gave patient first dose 28 mg nasal spray, administered in each nostril as directed and observed by nurse, waited 5 more minutes for the second and third dose. After all doses given pt did not complain of any nausea/vomiting, pt did have a drink to help with the taste of Spravato  it gives the metallic taste. Assessed her 40 minute vitals, 2:55 PM, 108/70, pulse 78, SpO2 99%.  Explained she would be monitored for a total time of 120 minutes. Discharge vitals were taken at 4:04 PM 109/69, P 86, SpO2 99%. Verneita came to visit with patient once her thoughts were clearer to discuss how treatment went. Recommend she go home and sleep or just relax on the couch. No driving, no intense activities. Verbalized  understanding. Nurse was with pt a total of 60 minutes for clinical assessment. Pt will continue 84 mg for her treatments. Instructed to call with any issues prior to her next apt on Monday.   (84 mg)  LOT [id number] EXP AUG 2028

## 2024-06-03 NOTE — Progress Notes (Signed)
 Medication Samples have been provided to the patient.  Drug name: Qulipta        Strength: 60 mg        Qty: 5  LOT: [id number]  Exp.Date: 09/27  Dosing instructions: daily  The patient has been instructed regarding the correct time, dose, and frequency of taking this medication, including desired effects and most common side effects.   Yolanda Wolfe 8:55 AM 06/03/2024

## 2024-06-03 NOTE — Telephone Encounter (Signed)
 Pt called and LM with AN. She can't get her Nurtec until January, wants to know if she can have samples

## 2024-06-03 NOTE — Progress Notes (Unsigned)
 [facility]  Patient ID: Yolanda Wolfe,  MRN: [id number]  PCP: Jordan, Betty G, MD  Date of Evaluation: 06/03/2024 Time spent:65 minutes  Chief Complaint:  Chief Complaint   Depression; Other    HISTORY/CURRENT STATUS: HPI  For Spravato  treatment  She's doing really well.  Feels like her mood is 'even' with no dips at all.  The Spravato  is helping!  She is able to enjoy things, had a good weekend and was tired.  Allowed herself to rest yesterday, but states she wasn't depressed like in the past when she stayed in bed on the weekends.  Energy and motivation are good.  The modafinil  is helpful.  Work is going well.   No extreme sadness, tearfulness, or feelings of hopelessness.  Sleeps well with the Lunesta .  ADLs and personal hygiene are normal.  No change in memory.  Appetite has not changed.  Anxiety is controlled.  Looking forward to going to [place] later this week.  No mania, delirium, AH/VH.  No SI/HI.  Individual Medical History/ Review of Systems: Changes? :No     Past medications for mental health diagnoses include: Amitriptylline for sleep? Celexa,  Zoloft , Cymbalta , Wellbutrin  caused migraines to worsen,  Lunesta , Ativan , Belsomra , Temazapam,  trazodone  not effective at low doses but at 100 mg she was too groggy, Mirtazapine  caused extreme wt gain, Sonata  ineffective, Ambien   Topamax  for migraines, somewhat effective  Allergies: Wellbutrin  [bupropion ]  Current Medications:  Current Outpatient Medications:    ALPRAZolam  (XANAX ) 1 MG tablet, TAKE 2 TABLETS (2 MG TOTAL) BY MOUTH AT BEDTIME, Disp: 60 tablet, Rfl: 5   Atogepant  (QULIPTA ) 60 MG TABS, Take 1 tablet (60 mg total) by mouth daily., Disp: 30 tablet, Rfl: 11   botulinum toxin Type A  (BOTOX ) 200 units injection, Inject 155 units IM into multiple site in the face,neck and head once every 90 days, Disp: 1 each, Rfl: 4   DOTTI 0.05 MG/24HR patch, APPLY 1 PATCH TWICE A WEEK TRANSDERMALLY FOR 28 DAYS,  Disp: , Rfl:    DULoxetine  (CYMBALTA ) 60 MG capsule, Take 2 capsules (120 mg total) by mouth daily., Disp: 180 capsule, Rfl: 1   Esketamine HCl, 84 MG Dose, (SPRAVATO , 84 MG DOSE,) 28 MG/DEVICE SOPK, Place 84 mg into the nose every 3 (three) days., Disp: , Rfl:    Eszopiclone  3 MG TABS, Take 1 tablet (3 mg total) by mouth at bedtime as needed., Disp: 30 tablet, Rfl: 5   hydrOXYzine  (ATARAX ) 25 MG tablet, TAKE 1 TABLET FOR SLEEP AS NEEDED, Disp: 90 tablet, Rfl: 1   lurasidone  (LATUDA ) 80 MG TABS tablet, Take 1 tablet (80 mg total) by mouth daily with breakfast., Disp: 90 tablet, Rfl: 1   Methylfol-Algae-B12-Acetylcyst (CEREFOLIN NAC) 6-90.314-2-600 MG TABS, Take 1 tablet by mouth daily., Disp: 30 tablet, Rfl: 11   modafinil  (PROVIGIL ) 200 MG tablet, Take 0.5-1 tablets (100-200 mg total) by mouth every morning., Disp: 30 tablet, Rfl: 0   prazosin  (MINIPRESS ) 1 MG capsule, TAKE 3 CAPSULES (3 MG TOTAL) BY MOUTH AT BEDTIME, Disp: 90 capsule, Rfl: 1   progesterone (PROMETRIUM) 100 MG capsule, Take 100 mg by mouth daily., Disp: , Rfl:    Rimegepant Sulfate (NURTEC) 75 MG TBDP, Take 1 tablet (75 mg total) by mouth daily as needed., Disp: 16 tablet, Rfl: 11   Tirzepatide (MOUNJARO [place]), Inject into the skin., Disp: , Rfl:    tretinoin (RETIN-A) 0.1 % cream, , Disp: , Rfl:  Medication Side Effects: H/A w/ Wellbutrin , Sexual  dysfunction with Cymbalta    Family Medical/ Social History: Changes? no  MENTAL HEALTH EXAM:  There were no vitals taken for this visit.There is no height or weight on file to calculate BMI.  General Appearance: Casual and Well Groomed  Eye Contact:  Good  Speech:  Clear and Coherent and Normal Rate  Volume:  Normal  Mood:  happy  Affect:  Congruent and and smiling  Thought Process:  Goal Directed and Descriptions of Associations: Circumstantial  Orientation:  Full (Time, Place, and Person)  Thought Content: Logical   Suicidal Thoughts:  No  Homicidal Thoughts:  No  Memory:   WNL  Judgement:  Good  Insight:  Good  Psychomotor Activity:  Normal  Concentration:  Concentration: Good and Attention Span: Good  Recall:  Good  Fund of Knowledge: Good  Language: Good  Assets:  Communication Skills Desire for Improvement Financial Resources/Insurance Housing Leisure Time Resilience Social Support Transportation Vocational/Educational  ADL's:  Intact  Cognition: WNL  Prognosis:  Good   Dr. Jordan is following labs.   GeneSight test results are in chart under Labs from 08/05/2020, had moderately reduced folic acid conversion  Patient was administered Spravato  84 mg intranasally today.  The patient experienced more than usual dissociation today but it wasn't frightening and gradually resolved over the 2-hour period of observation.  There were no complications.  Specifically the patient did not have nausea or vomiting or headache.  Blood pressures remained within normal ranges at the 40-minute and 2-hour follow-up intervals.  By the time the 2-hour observation period was met the patient was alert and oriented and able to exit without assistance.  Patient feels the Spravato  administration is helpful for the treatment resistant depression and would like to continue the treatment.  See nursing note for further details.   ECT-MADRS    Flowsheet Row Clinical Support from 05/22/2024 in [facility] Office Visit from 02/27/2024 in [facility] Office Visit from 10/20/2023 in [facility] Office Visit from 08/31/2023 in [facility]  MADRS Total Score 19 34 17 38   GAD-7    Flowsheet Row Office Visit from 07/25/2022 in [facility] [place] Office Visit from 07/31/2020 in [facility]  Total GAD-7 Score 5 13   PHQ2-9    Flowsheet Row Office Visit from 07/25/2022 in [facility] [place] Office  Visit from 03/30/2022 in [facility] [place] Video Visit from 02/25/2022 in [facility] [place] Office Visit from 07/31/2020 in [facility]  PHQ-2 Total Score 2 6 2 1   PHQ-9 Total Score 10 23 10 9    DIAGNOSES:    ICD-10-CM   1. Treatment-resistant depression  F32.9     2. Generalized anxiety disorder  F41.1     3. 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  Lunesta  filled 05/26/2024.  Modafinil  filled 05/20/2024.  Xanax  filled 05/03/2024. I provided approximately  65 minutes of face to face time during this encounter, including time spent before and after the visit in records review, medical decision making, counseling pertinent to   She is doing really well with the current treatment so no changes will be made.  Continue Xanax   1 mg, 2 p.o. nightly as needed. Continue Cymbalta   60 mg, 2 every day.  Continue Spravato  84 mg twice a week. Continue Cerafolin NAC, 1 daily. Continue Lunesta  3 mg, 1 p.o. nightly. Continue hydroxyzine  25 mg, 1 p.o. nightly as needed. Continue  Latuda   80 mg, 1 p.o. q. evening with food. Continue modafinil  200 mg, 1/2-1 p.o. every morning. Continue prazosin  3 mg, 1 p.o. nightly.   Continue therapy with Marval Bunde, LCSW. Return in 1 week. (Will be in [place] later this week.)       Verneita Cooks, PA-C

## 2024-06-04 ENCOUNTER — Encounter: Payer: Self-pay | Admitting: Physician Assistant

## 2024-06-04 NOTE — Telephone Encounter (Signed)
 Patient will stop by to get samples.

## 2024-06-10 ENCOUNTER — Ambulatory Visit (INDEPENDENT_AMBULATORY_CARE_PROVIDER_SITE_OTHER): Admitting: Physician Assistant

## 2024-06-10 ENCOUNTER — Ambulatory Visit

## 2024-06-10 VITALS — BP 120/74 | HR 76

## 2024-06-10 DIAGNOSIS — E7212 Methylenetetrahydrofolate reductase deficiency: Secondary | ICD-10-CM

## 2024-06-10 DIAGNOSIS — F518 Other sleep disorders not due to a substance or known physiological condition: Secondary | ICD-10-CM | POA: Diagnosis not present

## 2024-06-10 DIAGNOSIS — F411 Generalized anxiety disorder: Secondary | ICD-10-CM

## 2024-06-10 DIAGNOSIS — F5105 Insomnia due to other mental disorder: Secondary | ICD-10-CM | POA: Diagnosis not present

## 2024-06-10 DIAGNOSIS — F329 Major depressive disorder, single episode, unspecified: Secondary | ICD-10-CM

## 2024-06-10 DIAGNOSIS — F99 Mental disorder, not otherwise specified: Secondary | ICD-10-CM | POA: Diagnosis not present

## 2024-06-10 NOTE — Telephone Encounter (Signed)
 Medication Samples have been provided to the patient.  Drug name: nurtec       Strength: 75mg         Qty: 2  LOT: [id number]  Exp.Date: 8/28  Dosing instructions: take 1 tablet as needed only one tablet in 24 hours  The patient has been instructed regarding the correct time, dose, and frequency of taking this medication, including desired effects and most common side effects.

## 2024-06-10 NOTE — Progress Notes (Signed)
 NURSES NOTE:         Pt arrived for her 16th Spravato  Treatment for treatment resistant depression, the starting dose was 56 mg (2 of the 28 mg nasal sprays), pt tolerated that dose well she will receive 84 mg today which will be her maintenance dose. Pt reports she did well after her first treatment and feels good to increase her dose today. Pt sees Verneita Cooks, [place] and she will follow up with her at each treatment. Explained to pt how the treatments would be scheduled and answered any questions she had today. She was given a practice nasal trainer to try a few times before given the one with medication. She verbalized understanding. Pt's Spravato  is billed through buy and bill medically.  Spravato  medication is stored at treatment center per REMS/FDA guidelines. The medication is required to be locked behind two doors per REMS/FDA protocol. Medication is also disposed of properly after each use per regulations. All documentation for REMS is completed and submitted per FDA/REMS requirements.          Began taking patient's vital signs at 2:12 PM 104/84, pulse 75. SpO2 Sat. 99%. Stable to proceed with treatment. Instructed patient to blow her nose if needed then recline back to a 45 degree angle. Gave patient first dose 28 mg nasal spray, administered in each nostril as directed and observed by nurse, waited 5 more minutes for the second and third dose. After all doses given pt did not complain of any nausea/vomiting, pt did have a drink to help with the taste of Spravato  it gives the metallic taste. Assessed her 40 minute vitals, 3:00 PM, 124/72, pulse 71, SpO2 99%.  Explained she would be monitored for a total time of 120 minutes. Discharge vitals were taken at 4:00 PM 120/74, P 76, SpO2 99%. Verneita came to visit with patient once her thoughts were clearer to discuss how treatment went. Recommend she go home and sleep or just relax on the couch. No driving, no intense activities. Verbalized  understanding. Nurse was with pt a total of 60 minutes for clinical assessment. Pt will continue 84 mg for her treatments. Instructed to call with any issues prior to her next apt on Monday.   (84 mg)  LOT [id number] EXP AUG 2028

## 2024-06-16 ENCOUNTER — Other Ambulatory Visit: Payer: Self-pay | Admitting: Physician Assistant

## 2024-06-17 ENCOUNTER — Encounter

## 2024-06-17 ENCOUNTER — Encounter: Admitting: Physician Assistant

## 2024-06-18 ENCOUNTER — Other Ambulatory Visit: Payer: Self-pay | Admitting: Internal Medicine

## 2024-06-18 ENCOUNTER — Ambulatory Visit: Admitting: Psychiatry

## 2024-06-18 ENCOUNTER — Encounter: Payer: Self-pay | Admitting: Physician Assistant

## 2024-06-18 ENCOUNTER — Other Ambulatory Visit: Payer: Self-pay | Admitting: Physician Assistant

## 2024-06-18 NOTE — Progress Notes (Signed)
 "     [facility]  Patient ID: Yolanda Wolfe,  MRN: [id number]  PCP: Jordan, Betty G, MD  Date of Evaluation: 06/10/2024 Time spent:60 minutes  Chief Complaint:  Chief Complaint   Depression; Other     HISTORY/CURRENT STATUS: HPI  For Spravato  treatment   Went to [place] [employer] [place] with her dtr last week and had a really good time. Because she was gone, she only had one Spravato  treatment. She did not experience and major depression although she did get a little sad closer to the time of this treatment.  Overall, the meds are effective now.  Energy and motivation are good.  Work is going well.   No extreme sadness, tearfulness, or feelings of hopelessness.  ADLs and personal hygiene are normal.   Denies any changes in concentration, making decisions, or remembering things.  Appetite has not changed.  Weight is stable.  Anxiety isn't much of a problem.  She usually only takes the Xanax  in the evening to help her relax so she can sleep.  Has been having more weird dreams but not nightmares. The Prazosin  has helped that.  No mania, delirium, AH/VH.  No SI/HI.  Individual Medical History/ Review of Systems: Changes? :No     Past medications for mental health diagnoses include: Amitriptylline for sleep? Celexa,  Zoloft , Cymbalta , Wellbutrin  caused migraines to worsen,  Lunesta , Ativan , Belsomra , Temazapam,  trazodone  not effective at low doses but at 100 mg she was too groggy, Mirtazapine  caused extreme wt gain, Sonata  ineffective, Ambien   Topamax  for migraines, somewhat effective  Allergies: Wellbutrin  [bupropion ]  Current Medications:  Current Outpatient Medications:    ALPRAZolam  (XANAX ) 1 MG tablet, TAKE 2 TABLETS (2 MG TOTAL) BY MOUTH AT BEDTIME, Disp: 60 tablet, Rfl: 5   Atogepant  (QULIPTA ) 60 MG TABS, Take 1 tablet (60 mg total) by mouth daily., Disp: 30 tablet, Rfl: 11   botulinum toxin Type A  (BOTOX ) 200 units injection, Inject 155 units IM into multiple site in the  face,neck and head once every 90 days, Disp: 1 each, Rfl: 4   DOTTI 0.05 MG/24HR patch, APPLY 1 PATCH TWICE A WEEK TRANSDERMALLY FOR 28 DAYS, Disp: , Rfl:    DULoxetine  (CYMBALTA ) 60 MG capsule, Take 2 capsules (120 mg total) by mouth daily., Disp: 180 capsule, Rfl: 1   Esketamine HCl, 84 MG Dose, (SPRAVATO , 84 MG DOSE,) 28 MG/DEVICE SOPK, Place 84 mg into the nose every 3 (three) days., Disp: , Rfl:    Eszopiclone  3 MG TABS, Take 1 tablet (3 mg total) by mouth at bedtime as needed., Disp: 30 tablet, Rfl: 5   hydrOXYzine  (ATARAX ) 25 MG tablet, TAKE 1 TABLET FOR SLEEP AS NEEDED, Disp: 90 tablet, Rfl: 1   lurasidone  (LATUDA ) 80 MG TABS tablet, Take 1 tablet (80 mg total) by mouth daily with breakfast., Disp: 90 tablet, Rfl: 1   Methylfol-Algae-B12-Acetylcyst (CEREFOLIN NAC) 6-90.314-2-600 MG TABS, Take 1 tablet by mouth daily., Disp: 30 tablet, Rfl: 11   modafinil  (PROVIGIL ) 200 MG tablet, Take 0.5-1 tablets (100-200 mg total) by mouth every morning., Disp: 30 tablet, Rfl: 0   prazosin  (MINIPRESS ) 1 MG capsule, TAKE 3 CAPSULES (3 MG TOTAL) BY MOUTH AT BEDTIME, Disp: 90 capsule, Rfl: 1   progesterone (PROMETRIUM) 100 MG capsule, Take 100 mg by mouth daily., Disp: , Rfl:    Rimegepant Sulfate (NURTEC) 75 MG TBDP, Take 1 tablet (75 mg total) by mouth daily as needed., Disp: 16 tablet, Rfl: 11   Tirzepatide (MOUNJARO  [place]), Inject into the skin., Disp: , Rfl:    tretinoin (RETIN-A) 0.1 % cream, , Disp: , Rfl:  Medication Side Effects: H/A w/ Wellbutrin , Sexual dysfunction with Cymbalta    Family Medical/ Social History: Changes? no  MENTAL HEALTH EXAM:  There were no vitals taken for this visit.There is no height or weight on file to calculate BMI.  General Appearance: Casual and Well Groomed  Eye Contact:  Good  Speech:  Clear and Coherent and Normal Rate  Volume:  Normal  Mood:  Euthymic  Affect:  Congruent  Thought Process:  Goal Directed and Descriptions of Associations: Circumstantial   Orientation:  Full (Time, Place, and Person)  Thought Content: Logical   Suicidal Thoughts:  No  Homicidal Thoughts:  No  Memory:  WNL  Judgement:  Good  Insight:  Good  Psychomotor Activity:  Normal  Concentration:  Concentration: Good and Attention Span: Good  Recall:  Good  Fund of Knowledge: Good  Language: Good  Assets:  Communication Skills Desire for Improvement Financial Resources/Insurance Housing Leisure Time Resilience Social Support Transportation Vocational/Educational  ADL's:  Intact  Cognition: WNL  Prognosis:  Good   Dr. Jordan is following labs.   GeneSight test results are in chart under Labs from 08/05/2020, had moderately reduced folic acid conversion  Patient was administered Spravato  84 mg intranasally today.  The patient experienced the typical dissociation which gradually resolved over the 2-hour period of observation.  There were no complications.  Specifically the patient did not have nausea or vomiting or headache.  Blood pressures remained within normal ranges at the 40-minute and 2-hour follow-up intervals.  By the time the 2-hour observation period was met the patient was alert and oriented and able to exit without assistance.  Patient feels the Spravato  administration is helpful for the treatment resistant depression and would like to continue the treatment.  See nursing note for further details.   ECT-MADRS    Flowsheet Row Clinical Support from 05/22/2024 in [facility] Office Visit from 02/27/2024 in [facility] Office Visit from 10/20/2023 in [facility] Office Visit from 08/31/2023 in [facility]  MADRS Total Score 19 34 17 38   GAD-7    Flowsheet Row Office Visit from 07/25/2022 in [facility] [place] Office Visit from 07/31/2020 in [facility]  Total GAD-7 Score 5 13   PHQ2-9     Flowsheet Row Office Visit from 07/25/2022 in [facility] [place] Office Visit from 03/30/2022 in [facility] [place] Video Visit from 02/25/2022 in [facility] [place] Office Visit from 07/31/2020 in [facility]  PHQ-2 Total Score 2 6 2 1   PHQ-9 Total Score 10 23 10 9    DIAGNOSES:    ICD-10-CM   1. Treatment-resistant depression  F32.9     2. Generalized anxiety disorder  F41.1     3. 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 Abnormal dreams  F51.8       Receiving Psychotherapy: Yes with Marval Bunde, LCSW  RECOMMENDATIONS:  PDMP was reviewed.  Lunesta  filled 05/26/2024.  Modafinil  filled 05/20/2024.  Xanax  filled 05/03/2024. I provided approximately  60 minutes of face to face time during this encounter, including time spent before and after the visit  in records review, medical decision making, counseling pertinent to   She is doing really well! No changes need to be made.   Watch for worsening off abnl dreams.  If they worsen, will increase Prazosin . Neither of us  feel that it's necessary now.   Continue Xanax  1 mg, 2 p.o. nightly as needed. Continue Cymbalta   60 mg, 2 every day.  Continue Spravato  84 mg twice a week. Continue Cerafolin NAC, 1 daily. Continue Lunesta  3 mg, 1 p.o. nightly. Continue hydroxyzine  25 mg, 1 p.o. nightly as needed. Continue  Latuda   80 mg, 1 p.o. q. evening with food. Continue modafinil  200 mg, 1/2-1 p.o. every morning. Continue prazosin  3 mg, 1 p.o. nightly.   Continue therapy with Marval Bunde, LCSW. Return in 1 week.  (Only one treatment this week d/t Christmas holidays.)  Verneita Cooks, PA-C  "

## 2024-06-19 ENCOUNTER — Ambulatory Visit: Admitting: Psychiatry

## 2024-06-19 ENCOUNTER — Ambulatory Visit

## 2024-06-19 VITALS — BP 109/73 | HR 81

## 2024-06-19 DIAGNOSIS — F329 Major depressive disorder, single episode, unspecified: Secondary | ICD-10-CM

## 2024-06-19 NOTE — Progress Notes (Signed)
 NURSES NOTE:         Pt arrived for her 17 th Spravato  Treatment for treatment resistant depression, the starting dose was 56 mg (2 of the 28 mg nasal sprays), pt tolerated that dose well she will receive 84 mg today which will be her maintenance dose. Pt reports she did well after her first treatment and feels good to increase her dose today. Pt sees Verneita Cooks, [place] and she will follow up with her at each treatment. Explained to pt how the treatments would be scheduled and answered any questions she had today. She was given a practice nasal trainer to try a few times before given the one with medication. She verbalized understanding. Pt's Spravato  is billed through buy and bill medically.  Spravato  medication is stored at treatment center per REMS/FDA guidelines. The medication is required to be locked behind two doors per REMS/FDA protocol. Medication is also disposed of properly after each use per regulations. All documentation for REMS is completed and submitted per FDA/REMS requirements.          Began taking patient's vital signs at 2:14 PM 111/66, pulse 66. SpO2 Sat. 98%. Stable to proceed with treatment. Instructed patient to blow her nose if needed then recline back to a 45 degree angle. Gave patient first dose 28 mg nasal spray, administered in each nostril as directed and observed by nurse, waited 5 more minutes for the second and third dose. After all doses given pt did not complain of any nausea/vomiting, pt did have a drink to help with the taste of Spravato  it gives the metallic taste. Assessed her 40 minute vitals, 3:00 PM, 102/71, pulse 75, SpO2 98%.  Explained she would be monitored for a total time of 120 minutes. Discharge vitals were taken at 4:28 PM 109/73, P 81, SpO2 98%. Dr. Geoffry came to visit with patient once her thoughts were clearer to discuss how treatment went. Recommend she go home and sleep or just relax on the couch. No driving, no intense activities. Verbalized  understanding. Nurse was with pt a total of 60 minutes for clinical assessment. Pt will continue 84 mg for her treatments. Instructed to call with any issues prior to her next apt on Monday.   (84 mg)  LOT [id number] EXP AUG 2028

## 2024-06-19 NOTE — Progress Notes (Signed)
 NC1

## 2024-06-24 ENCOUNTER — Encounter: Payer: Self-pay | Admitting: Physician Assistant

## 2024-06-24 ENCOUNTER — Ambulatory Visit (INDEPENDENT_AMBULATORY_CARE_PROVIDER_SITE_OTHER): Admitting: Physician Assistant

## 2024-06-24 ENCOUNTER — Ambulatory Visit

## 2024-06-24 VITALS — BP 125/77 | HR 74

## 2024-06-24 DIAGNOSIS — F329 Major depressive disorder, single episode, unspecified: Secondary | ICD-10-CM

## 2024-06-24 DIAGNOSIS — F411 Generalized anxiety disorder: Secondary | ICD-10-CM | POA: Diagnosis not present

## 2024-06-24 DIAGNOSIS — F515 Nightmare disorder: Secondary | ICD-10-CM

## 2024-06-24 DIAGNOSIS — F99 Mental disorder, not otherwise specified: Secondary | ICD-10-CM

## 2024-06-24 MED ORDER — PRAZOSIN HCL 2 MG PO CAPS: 4.0000 mg | ORAL_CAPSULE | Freq: Every day | ORAL | 1 refills | Status: DC

## 2024-06-24 NOTE — Progress Notes (Signed)
 NURSES NOTE:         Pt arrived for her 18 th Spravato  Treatment for treatment resistant depression, the starting dose was 56 mg (2 of the 28 mg nasal sprays), pt tolerated that dose well she will receive 84 mg today which will be her maintenance dose. Pt reports she did well after her first treatment and feels good to increase her dose today. Pt sees Verneita Cooks, [place] and she will follow up with her at each treatment. Explained to pt how the treatments would be scheduled and answered any questions she had today. She was given a practice nasal trainer to try a few times before given the one with medication. She verbalized understanding. Pt's Spravato  is billed through buy and bill medically.  Spravato  medication is stored at treatment center per REMS/FDA guidelines. The medication is required to be locked behind two doors per REMS/FDA protocol. Medication is also disposed of properly after each use per regulations. All documentation for REMS is completed and submitted per FDA/REMS requirements.          Began taking patient's vital signs at 3:03 PM 119/79, pulse 82. SpO2 Sat. 98%. Stable to proceed with treatment. Instructed patient to blow her nose if needed then recline back to a 45 degree angle. Gave patient first dose 28 mg nasal spray, administered in each nostril as directed and observed by nurse, waited 5 more minutes for the second and third dose. After all doses given pt did not complain of any nausea/vomiting, pt did have a drink to help with the taste of Spravato  it gives the metallic taste. Assessed her 40 minute vitals, 3:43 PM, 116/73, pulse 70, SpO2 96%.  Explained she would be monitored for a total time of 120 minutes. Discharge vitals were taken at 5:03 PM 125/77, P 74, SpO2 98%. Verneita came to visit with patient once her thoughts were clearer to discuss how treatment went. Recommend she go home and sleep or just relax on the couch. No driving, no intense activities. Verbalized  understanding. Nurse was with pt a total of 60 minutes for clinical assessment. Pt will continue 84 mg for her treatments. Instructed to call with any issues prior to her next apt on Wednesday.   (84 mg)  LOT [id number] EXP AUG 2028

## 2024-06-24 NOTE — Progress Notes (Signed)
 "     [facility]  Patient ID: Yolanda Wolfe,  MRN: [id number]  PCP: Jordan, Betty G, MD  Date of Evaluation: 06/24/2024 Time spent:60 minutes  Chief Complaint:  Chief Complaint   Depression; Other     HISTORY/CURRENT STATUS: HPI  For Spravato  treatment  Nightmares are worsening over the past few weeks.  The Prazosin  has been helpful but not as much lately.    Depression sx are still very controlled. She continues to have more energy and motivation.  Doesn't dread things as much or want to stay in bed a lot.  Work is going ok.  No extreme sadness, tearfulness, or feelings of hopelessness.   ADLs and personal hygiene are normal.   Denies any changes in concentration, making decisions, or remembering things.  Appetite has not changed. Anxiety is controlled.  No mania, delirium, AH/VH.  No SI/HI.  Individual Medical History/ Review of Systems: Changes? :No     Past medications for mental health diagnoses include: Amitriptylline for sleep? Celexa,  Zoloft , Cymbalta , Wellbutrin  caused migraines to worsen,  Lunesta , Ativan , Belsomra , Temazapam,  trazodone  not effective at low doses but at 100 mg she was too groggy, Mirtazapine  caused extreme wt gain, Sonata  ineffective, Ambien   Topamax  for migraines, somewhat effective  Allergies: Wellbutrin  [bupropion ]  Current Medications:  Current Outpatient Medications:    ALPRAZolam  (XANAX ) 1 MG tablet, TAKE 2 TABLETS (2 MG TOTAL) BY MOUTH AT BEDTIME, Disp: 60 tablet, Rfl: 5   Atogepant  (QULIPTA ) 60 MG TABS, Take 1 tablet (60 mg total) by mouth daily., Disp: 30 tablet, Rfl: 11   botulinum toxin Type A  (BOTOX ) 200 units injection, Inject 155 units IM into multiple site in the face,neck and head once every 90 days, Disp: 1 each, Rfl: 4   DOTTI 0.05 MG/24HR patch, APPLY 1 PATCH TWICE A WEEK TRANSDERMALLY FOR 28 DAYS, Disp: , Rfl:    DULoxetine  (CYMBALTA ) 60 MG capsule, Take 2 capsules (120 mg total) by mouth daily., Disp: 180  capsule, Rfl: 1   Esketamine HCl, 84 MG Dose, (SPRAVATO , 84 MG DOSE,) 28 MG/DEVICE SOPK, Place 84 mg into the nose every 3 (three) days., Disp: , Rfl:    Eszopiclone  3 MG TABS, Take 1 tablet (3 mg total) by mouth at bedtime as needed., Disp: 30 tablet, Rfl: 5   hydrOXYzine  (ATARAX ) 25 MG tablet, TAKE 1 TABLET FOR SLEEP AS NEEDED, Disp: 90 tablet, Rfl: 1   lurasidone  (LATUDA ) 80 MG TABS tablet, Take 1 tablet (80 mg total) by mouth daily with breakfast., Disp: 90 tablet, Rfl: 1   Methylfol-Algae-B12-Acetylcyst (CEREFOLIN NAC) 6-90.314-2-600 MG TABS, Take 1 tablet by mouth daily., Disp: 30 tablet, Rfl: 11   modafinil  (PROVIGIL ) 200 MG tablet, TAKE 0.5-1 TABLETS (100-200 MG TOTAL) BY MOUTH EVERY MORNING., Disp: 30 tablet, Rfl: 5   prazosin  (MINIPRESS ) 2 MG capsule, Take 2 capsules (4 mg total) by mouth at bedtime., Disp: 60 capsule, Rfl: 1   progesterone (PROMETRIUM) 100 MG capsule, Take 100 mg by mouth daily., Disp: , Rfl:    Rimegepant Sulfate (NURTEC) 75 MG TBDP, Take 1 tablet (75 mg total) by mouth daily as needed., Disp: 16 tablet, Rfl: 11   Tirzepatide (MOUNJARO [place]), Inject into the skin., Disp: , Rfl:    tretinoin (RETIN-A) 0.1 % cream, , Disp: , Rfl:  Medication Side Effects: H/A w/ Wellbutrin , Sexual dysfunction with Cymbalta    Family Medical/ Social History: Changes? no  MENTAL HEALTH EXAM:  Blood pressure 125/77, pulse 74, SpO2 98%.There  is no height or weight on file to calculate BMI.  General Appearance: Casual and Well Groomed  Eye Contact:  Good  Speech:  Clear and Coherent and Normal Rate  Volume:  Normal  Mood:  Euthymic  Affect:  Congruent  Thought Process:  Goal Directed and Descriptions of Associations: Circumstantial  Orientation:  Full (Time, Place, and Person)  Thought Content: Logical   Suicidal Thoughts:  No  Homicidal Thoughts:  No  Memory:  WNL  Judgement:  Good  Insight:  Good  Psychomotor Activity:  Normal  Concentration:  Concentration: Good and Attention  Span: Good  Recall:  Good  Fund of Knowledge: Good  Language: Good  Assets:  Communication Skills Desire for Improvement Financial Resources/Insurance Housing Resilience Social Support Transportation Vocational/Educational  ADL's:  Intact  Cognition: WNL  Prognosis:  Good   Dr. Jordan is following labs.   GeneSight test results are in chart under Labs from 08/05/2020, had moderately reduced folic acid conversion  Patient was administered Spravato  84 mg intranasally today.  The patient experienced the typical dissociation which gradually resolved over the 2-hour period of observation.  There were no complications.  Specifically the patient did not have nausea or vomiting or headache.  Blood pressures remained within normal ranges at the 40-minute and 2-hour follow-up intervals.  By the time the 2-hour observation period was met the patient was alert and oriented and able to exit without assistance.  Patient feels the Spravato  administration is helpful for the treatment resistant depression and would like to continue the treatment.  See nursing note for further details.   ECT-MADRS    Flowsheet Row Clinical Support from 05/22/2024 in [facility] Office Visit from 02/27/2024 in [facility] Office Visit from 10/20/2023 in [facility] Office Visit from 08/31/2023 in [facility]  MADRS Total Score 19 34 17 38   GAD-7    Flowsheet Row Office Visit from 07/25/2022 in [facility] [place] Office Visit from 07/31/2020 in [facility]  Total GAD-7 Score 5 13   PHQ2-9    Flowsheet Row Office Visit from 07/25/2022 in [facility] [place] Office Visit from 03/30/2022 in [facility] [place] Video Visit from 02/25/2022 in [facility] [place] Office Visit from 07/31/2020 in  [facility]  PHQ-2 Total Score 2 6 2 1   PHQ-9 Total Score 10 23 10 9    DIAGNOSES:    ICD-10-CM   1. Treatment-resistant depression  F32.9     2. Generalized anxiety disorder  F41.1     3. Nightmares  F51.5     4. Insomnia due to other mental disorder  F51.05    F99       Receiving Psychotherapy: Yes with Marval Bunde, LCSW  RECOMMENDATIONS:  PDMP was reviewed.  Lunesta  filled 05/26/2024.  Modafinil  filled 05/20/2024.  Xanax  filled 05/03/2024. I provided approximately 60 minutes of face to face time during this encounter, including time spent before and after the visit in records review, medical decision making, counseling pertinent to today's visit, and charting.   Yolanda Wolfe is still responding to the Spravato .  We agree to continue twice weekly treatments and reassess around late winter or early spring.  We may be able to decrease to once a week tx then.   Will increase Prazosin  for nightmares.   Continue Xanax  1 mg, 2 p.o. nightly  as needed. Continue Cymbalta   60 mg, 2 every day.  Continue Spravato  84 mg twice a week. Continue Cerafolin NAC, 1 daily. Continue Lunesta  3 mg, 1 p.o. nightly. Continue hydroxyzine  25 mg, 1 p.o. nightly as needed. Continue  Latuda   80 mg, 1 p.o. q. evening with food. Continue modafinil  200 mg, 1/2-1 p.o. every morning. Increase prazosin  to 4 mg at bedtime.  Continue therapy with Marval Bunde, LCSW. Return 2 days.   Verneita Cooks, PA-C  "

## 2024-06-25 ENCOUNTER — Ambulatory Visit (INDEPENDENT_AMBULATORY_CARE_PROVIDER_SITE_OTHER): Admitting: Psychiatry

## 2024-06-25 ENCOUNTER — Encounter: Payer: Self-pay | Admitting: Physician Assistant

## 2024-06-25 DIAGNOSIS — F329 Major depressive disorder, single episode, unspecified: Secondary | ICD-10-CM | POA: Diagnosis not present

## 2024-06-25 NOTE — Progress Notes (Signed)
"   °      [facility]or[facility]rap[facility] Progress Note  Patient ID: Yolanda Wolfe, MRN: [id number],    Date: 06/25/2024  Time Spent: 45 minutes   Treatment Type: Individual Therapy  Reported Symptoms: anxiety decreased, depression decreased, motivation improving, some overthinking    Mental Status Exam:  Appearance:   Casual and Neat     Behavior:  Appropriate, Sharing, and Motivated  Motor:  Normal  Speech/Language:   Clear and Coherent  Affect:  Some depression and anxiety  Mood:  anxious and depressed  Thought process:  goal directed  Thought content:    Rumination  Sensory/Perceptual disturbances:    WNL  Orientation:  oriented to person, place, time/date, situation, day of week, month of year, year, and stated date of Jan. 6, 2026  Attention:  Good  Concentration:  Good  Memory:  WNL  Fund of knowledge:   Good  Insight:    Good  Judgment:   Good  Impulse Control:  Good   Risk Assessment: Danger to Self:  No Self-injurious Behavior: No Danger to Others: No Duty to Warn:no Physical Aggression / Violence:No  Access to Firearms a concern: No  Gang Involvement:No   Subjective:  Patient in session today reporting some improvement in her motivation, anxiety, and depression and continues to work with her goal-directed behaviors. Continues to be treated with Spravato , twice weekly, with success. To continue Mon and Wed treatments and will eventually look at scaling it back a while. Less energy at times but is motivated to continue. Less dreading of tasks. Less self-doubting. Less jumping to conclusions. Some sleep irregularity. Is wanting to get better and her motivation is good. Feeling more optimistic, proactive at times, and more able to follow through.But some sleep issues which she will address with her med provider. Was very tentative about her improvement but now is not so tentative an trusting her progress more. Not as much jumping to conclusions. Having more  hope and perseverance. Less dreading of tasks and responsibilities. Sleep can be irregular and is working on this with provider.    Interventions: Cognitive Behavioral Therapy, Solution-Oriented/Positive Psychology, and Ego-Supportive Identify life conflicts and/or situations including from her past and in the present, that support her current symptomology of anxiety and depression. Develop behavioral and cognitive strategies to reduce or eliminate excessive anxiety and depression.  Identify, challenge, and replace negative self talk with more positive, realistic, and empowering self talk. Patient will develop healthier self talk as a means of feeling better about herself and better managing her anxiety and depression. Patient to increase daily social experiences and work on strengthening a new non-avoidant approach and build self-confidence. Patient to work on developing reality based, positive cognitive messages.   Diagnosis:   ICD-10-CM   1. Treatment-resistant depression  F32.9      Plan:  Patient reporting and showing good response to the Spravato  medication. Not dreading things as much, especially things out of my control. Reports more hope and positive feelings about the future. Looks for more positives and less negatives. Feeling better about herself.   Goal review and progress/challenges noted with patient.  Next appointment within 3 to 4 weeks.   Barnie Bunde, LCSW                   "

## 2024-06-26 ENCOUNTER — Encounter: Payer: Self-pay | Admitting: Physician Assistant

## 2024-06-26 ENCOUNTER — Ambulatory Visit: Admitting: Physician Assistant

## 2024-06-26 ENCOUNTER — Other Ambulatory Visit: Payer: Self-pay | Admitting: Physician Assistant

## 2024-06-26 ENCOUNTER — Ambulatory Visit

## 2024-06-26 VITALS — BP 111/80 | HR 81

## 2024-06-26 DIAGNOSIS — F5105 Insomnia due to other mental disorder: Secondary | ICD-10-CM

## 2024-06-26 DIAGNOSIS — F329 Major depressive disorder, single episode, unspecified: Secondary | ICD-10-CM

## 2024-06-26 DIAGNOSIS — F411 Generalized anxiety disorder: Secondary | ICD-10-CM

## 2024-06-26 DIAGNOSIS — F515 Nightmare disorder: Secondary | ICD-10-CM

## 2024-06-26 NOTE — Progress Notes (Signed)
 "     [facility]  Patient ID: Yolanda Wolfe,  MRN: [id number]  PCP: Jordan, Betty G, MD  Date of Evaluation: 06/26/2024 Time spent:60 minutes  Chief Complaint:  Chief Complaint   Depression; Other    HISTORY/CURRENT STATUS: HPI  For Spravato  treatment  Nightmares have been better the past 2 nights, since we increased the Prazosin . No dizziness or neg SE. Feels the Spravato  is still effective. She is able to enjoy things.  Energy and motivation are good.  Work is going well.   No extreme sadness, tearfulness, or feelings of hopelessness.   ADLs and personal hygiene are normal.   No changes in concentration, making decisions, or remembering things.  Appetite has not changed.  Anxiety is controlled.  No mania, delirium, AH/VH.  No SI/HI.  Individual Medical History/ Review of Systems: Changes? :No     Past medications for mental health diagnoses include: Amitriptylline for sleep? Celexa,  Zoloft , Cymbalta , Wellbutrin  caused migraines to worsen,  Lunesta , Ativan , Belsomra , Temazapam,  trazodone  not effective at low doses but at 100 mg she was too groggy, Mirtazapine  caused extreme wt gain, Sonata  ineffective, Ambien   Topamax  for migraines, somewhat effective  Allergies: Wellbutrin  [bupropion ]  Current Medications:  Current Outpatient Medications:    ALPRAZolam  (XANAX ) 1 MG tablet, TAKE 2 TABLETS (2 MG TOTAL) BY MOUTH AT BEDTIME, Disp: 60 tablet, Rfl: 5   Atogepant  (QULIPTA ) 60 MG TABS, Take 1 tablet (60 mg total) by mouth daily., Disp: 30 tablet, Rfl: 11   botulinum toxin Type A  (BOTOX ) 200 units injection, Inject 155 units IM into multiple site in the face,neck and head once every 90 days, Disp: 1 each, Rfl: 4   DOTTI 0.05 MG/24HR patch, APPLY 1 PATCH TWICE A WEEK TRANSDERMALLY FOR 28 DAYS, Disp: , Rfl:    DULoxetine  (CYMBALTA ) 60 MG capsule, Take 2 capsules (120 mg total) by mouth daily., Disp: 180 capsule, Rfl: 1   Esketamine HCl, 84 MG Dose, (SPRAVATO , 84 MG  DOSE,) 28 MG/DEVICE SOPK, Place 84 mg into the nose every 3 (three) days., Disp: , Rfl:    hydrOXYzine  (ATARAX ) 25 MG tablet, TAKE 1 TABLET FOR SLEEP AS NEEDED, Disp: 90 tablet, Rfl: 1   lurasidone  (LATUDA ) 80 MG TABS tablet, Take 1 tablet (80 mg total) by mouth daily with breakfast., Disp: 90 tablet, Rfl: 1   Methylfol-Algae-B12-Acetylcyst (CEREFOLIN NAC) 6-90.314-2-600 MG TABS, Take 1 tablet by mouth daily., Disp: 30 tablet, Rfl: 11   modafinil  (PROVIGIL ) 200 MG tablet, TAKE 0.5-1 TABLETS (100-200 MG TOTAL) BY MOUTH EVERY MORNING., Disp: 30 tablet, Rfl: 5   prazosin  (MINIPRESS ) 2 MG capsule, Take 2 capsules (4 mg total) by mouth at bedtime., Disp: 60 capsule, Rfl: 1   progesterone (PROMETRIUM) 100 MG capsule, Take 100 mg by mouth daily., Disp: , Rfl:    Rimegepant Sulfate (NURTEC) 75 MG TBDP, Take 1 tablet (75 mg total) by mouth daily as needed., Disp: 16 tablet, Rfl: 11   Tirzepatide (MOUNJARO [place]), Inject into the skin., Disp: , Rfl:    tretinoin (RETIN-A) 0.1 % cream, , Disp: , Rfl:    Eszopiclone  3 MG TABS, TAKE 1 TABLET (3 MG TOTAL) BY MOUTH AT BEDTIME AS NEEDED., Disp: 30 tablet, Rfl: 5 Medication Side Effects: H/A w/ Wellbutrin , Sexual dysfunction with Cymbalta    Family Medical/ Social History: Changes? no  MENTAL HEALTH EXAM:  There were no vitals taken for this visit.There is no height or weight on file to calculate BMI.  General Appearance:  Casual and Well Groomed  Eye Contact:  Good  Speech:  Clear and Coherent and Normal Rate  Volume:  Normal  Mood:  Euthymic  Affect:  Congruent  Thought Process:  Goal Directed and Descriptions of Associations: Circumstantial  Orientation:  Full (Time, Place, and Person)  Thought Content: Logical   Suicidal Thoughts:  No  Homicidal Thoughts:  No  Memory:  WNL  Judgement:  Good  Insight:  Good  Psychomotor Activity:  Normal  Concentration:  Concentration: Good and Attention Span: Good  Recall:  Good  Fund of Knowledge: Good  Language:  Good  Assets:  Communication Skills Desire for Improvement Financial Resources/Insurance Housing Leisure Time Resilience Social Support Transportation Vocational/Educational  ADL's:  Intact  Cognition: WNL  Prognosis:  Good   Dr. Jordan is following labs.   GeneSight test results are in chart under Labs from 08/05/2020, had moderately reduced folic acid conversion  Patient was administered Spravato  84 mg intranasally today.  The patient experienced the typical dissociation which gradually resolved over the 2-hour period of observation.  There were no complications.  Specifically the patient did not have nausea or vomiting or headache.  Pulse ox and lood pressures remained within normal ranges at the 40-minute and 2-hour follow-up intervals.  By the time the 2-hour observation period was met the patient was alert and oriented and able to exit without assistance.  Patient feels the Spravato  administration is helpful for the treatment resistant depression and would like to continue the treatment.  See nursing note for further details.   ECT-MADRS    Flowsheet Row Clinical Support from 05/22/2024 in [facility] Office Visit from 02/27/2024 in [facility] Office Visit from 10/20/2023 in [facility] Office Visit from 08/31/2023 in [facility]  MADRS Total Score 19 34 17 38   GAD-7    Flowsheet Row Office Visit from 07/25/2022 in [facility] [place] Office Visit from 07/31/2020 in [facility]  Total GAD-7 Score 5 13   PHQ2-9    Flowsheet Row Office Visit from 07/25/2022 in [facility] [place] Office Visit from 03/30/2022 in [facility] [place] Video Visit from 02/25/2022 in [facility] [place] Office Visit from 07/31/2020 in [facility]   PHQ-2 Total Score 2 6 2 1   PHQ-9 Total Score 10 23 10 9    DIAGNOSES:    ICD-10-CM   1. Treatment-resistant depression  F32.9     2. Generalized anxiety disorder  F41.1     3. Nightmares  F51.5     4. Insomnia due to other mental disorder  F51.05    F99       Receiving Psychotherapy: Yes with Marval Bunde, LCSW  RECOMMENDATIONS:  PDMP was reviewed.  Lunesta  filled 05/26/2024.  Modafinil  filled 05/20/2024.  Xanax  filled 05/03/2024. I provided approximately  60 minutes of face to face time during this encounter, including time spent before and after the visit in records review, medical decision making, counseling pertinent to today's visit, and charting.   Nightmares are better with the higher dose of Prazosin  so will continue the same.   She's still responding to the twice weekly Spravato . We will reassess in another 6 weeks or so, (after the worst part of winter) and if she'd doing well, consider decreasing frequency of treatments.   Continue Xanax  1 mg, 2 p.o. nightly as  needed. Continue Cymbalta   60 mg, 2 every day.  Continue Spravato  84 mg twice a week. Continue Cerafolin NAC, 1 daily. Continue Lunesta  3 mg, 1 p.o. nightly. Continue hydroxyzine  25 mg, 1 p.o. nightly as needed. Continue  Latuda   80 mg, 1 p.o. q. evening with food. Continue modafinil  200 mg, 1/2-1 p.o. every morning. Continue prazosin   4 mg at bedtime.  Continue therapy with Marval Bunde, LCSW. Return in 5 days.  Verneita Cooks, PA-C  "

## 2024-06-26 NOTE — Telephone Encounter (Signed)
 Pt receiving Spravato  today Please send

## 2024-06-26 NOTE — Progress Notes (Signed)
 NURSES NOTE:         Pt arrived for her 19th Spravato  Treatment for treatment resistant depression, the starting dose was 56 mg (2 of the 28 mg nasal sprays), pt tolerated that dose well she will receive 84 mg today which will be her maintenance dose. Pt reports she did well after her first treatment and feels good to increase her dose today. Pt sees Verneita Cooks, [place] and she will follow up with her at each treatment. Explained to pt how the treatments would be scheduled and answered any questions she had today. She was given a practice nasal trainer to try a few times before given the one with medication. She verbalized understanding. Pt's Spravato  is billed through buy and bill medically.  Spravato  medication is stored at treatment center per REMS/FDA guidelines. The medication is required to be locked behind two doors per REMS/FDA protocol. Medication is also disposed of properly after each use per regulations. All documentation for REMS is completed and submitted per FDA/REMS requirements.          Began taking patient's vital signs at 3:11 PM 132/80, pulse 76. SpO2 Sat. 96%. Stable to proceed with treatment. Instructed patient to blow her nose if needed then recline back to a 45 degree angle. Gave patient first dose 28 mg nasal spray, administered in each nostril as directed and observed by nurse, waited 5 more minutes for the second and third dose. After all doses given pt did not complain of any nausea/vomiting, pt did have a drink to help with the taste of Spravato  it gives the metallic taste. Assessed her 40 minute vitals, 3:56 PM, 118/76, pulse 80, SpO2 98%. Pt reports feeling sleepy with her treatment today. Explained she would be monitored for a total time of 120 minutes. Discharge vitals were taken at 5:08 PM 111/80, P 81, SpO2 95%. Verneita came to visit with patient once her thoughts were clearer to discuss how treatment went. Recommend she go home and sleep or just relax on the couch. No  driving, no intense activities. Verbalized understanding. Nurse was with pt a total of 60 minutes for clinical assessment. Pt will continue 84 mg for her treatments. Instructed to call with any issues prior to her next apts on Monday and Wednesday.   (84 mg)  LOT [id number] EXP AUG 2028

## 2024-06-27 ENCOUNTER — Encounter: Payer: Self-pay | Admitting: Physician Assistant

## 2024-06-28 ENCOUNTER — Encounter: Payer: Self-pay | Admitting: Family Medicine

## 2024-06-28 ENCOUNTER — Ambulatory Visit: Admitting: Family Medicine

## 2024-06-28 VITALS — BP 98/72 | HR 86 | Temp 99.0°F | Ht 64.0 in | Wt 166.2 lb

## 2024-06-28 DIAGNOSIS — R300 Dysuria: Secondary | ICD-10-CM | POA: Diagnosis not present

## 2024-06-28 DIAGNOSIS — R8290 Unspecified abnormal findings in urine: Secondary | ICD-10-CM

## 2024-06-28 LAB — POC URINALSYSI DIPSTICK (AUTOMATED)
Bilirubin, UA: POSITIVE — AB
Blood, UA: POSITIVE
Glucose, UA: NEGATIVE
Ketones, UA: NEGATIVE
Nitrite, UA: NEGATIVE
Protein, UA: POSITIVE — AB
Spec Grav, UA: 1.015
Urobilinogen, UA: 0.2 U/dL
pH, UA: 6.5

## 2024-06-28 MED ORDER — NITROFURANTOIN MONOHYD MACRO 100 MG PO CAPS: 100.0000 mg | ORAL_CAPSULE | Freq: Two times a day (BID) | ORAL | 0 refills | Status: AC

## 2024-06-28 NOTE — Progress Notes (Signed)
 "  Established Patient Office Visit   Subjective  Patient ID: Yolanda Wolfe, female    DOB: 04-28-1976  Age: 49 y.o. MRN: [id number]  Chief Complaint  Patient presents with   Acute Visit    Patient came in today for a Burning with urination, and odor started 4 days ago    Patient is a 49 year old female follow-up with Dr. Jordan and seen for acute concern.  Patient endorses dysuria, frequency, and odor x 4 days.  Patient states feels like typical UTI but milder.  Denies nausea, vomiting, constipation, fever, chills, back pain, suprapubic pain/pressure, discharge, changes in soaps, lotions, detergents.  Drinking 6 glasses of water per day.  No recent ABX use or abx allergies.    Patient Active Problem List   Diagnosis Date Noted   Vitamin D  deficiency, unspecified 07/28/2022   CSF abnormal 01/19/2022   Tinnitus of left ear 01/18/2021   Headache, unspecified headache type 12/10/2019   Varicose veins of both lower extremities with pain 12/10/2019   Nephrolithiasis 10/31/2017   Allergic rhinitis 08/16/2017   Hyperlipidemia, mixed 08/16/2017   Severe obesity (BMI 35.0-39.9) with comorbidity (HCC) 08/16/2017   Major depressive disorder, recurrent episode 05/07/2015   Weight gain 06/19/2014   Depression 06/19/2014   Routine general medical examination at a health care facility 06/19/2014   Insomnia 06/19/2014   Past Medical History:  Diagnosis Date   Anxiety    Blood transfusion without reported diagnosis    Depression    Frequent headaches    GERD (gastroesophageal reflux disease)    Heart murmur    Migraine    UTI (lower urinary tract infection)    Past Surgical History:  Procedure Laterality Date   ABDOMINAL HYSTERECTOMY  2009   Emergency right after C-Section   CESAREAN SECTION     Social History[1] Family History  Problem Relation Age of Onset   Cancer Mother        neuroendocrine cancer   Cancer Father        skin   Healthy Daughter    Breast cancer  Maternal Grandmother 35   Heart disease Maternal Grandfather    Dementia Paternal Grandmother    Alcohol abuse Paternal Grandfather    Allergies[2]  ROS Negative unless stated above    Objective:     BP 98/72 (BP Location: Left Arm, Patient Position: Sitting, Cuff Size: Large)   Pulse 86   Temp 99 F (37.2 C) (Oral)   Ht 5' 4 (1.626 m)   Wt 166 lb 3.2 oz (75.4 kg)   LMP  (LMP Unknown)   SpO2 99%   BMI 28.53 kg/m  BP Readings from Last 3 Encounters:  06/28/24 98/72  06/03/24 112/78  05/02/24 106/74   Wt Readings from Last 3 Encounters:  06/28/24 166 lb 3.2 oz (75.4 kg)  06/03/24 162 lb 12.8 oz (73.8 kg)  05/02/24 164 lb 12.8 oz (74.8 kg)      Physical Exam Constitutional:      General: She is not in acute distress.    Appearance: Normal appearance.  HENT:     Head: Normocephalic and atraumatic.     Nose: Nose normal.     Mouth/Throat:     Mouth: Mucous membranes are moist.  Cardiovascular:     Rate and Rhythm: Normal rate and regular rhythm.     Heart sounds: Normal heart sounds. No murmur heard.    No gallop.  Pulmonary:     Effort: Pulmonary effort is normal.  No respiratory distress.     Breath sounds: Normal breath sounds. No wheezing, rhonchi or rales.  Abdominal:     General: Abdomen is flat. Bowel sounds are normal.     Palpations: Abdomen is soft.     Tenderness: There is no abdominal tenderness. There is no right CVA tenderness, left CVA tenderness, guarding or rebound.  Skin:    General: Skin is warm and dry.  Neurological:     Mental Status: She is alert and oriented to person, place, and time.        07/25/2022    3:27 PM 03/30/2022    3:03 PM 02/25/2022    1:57 PM  Depression screen PHQ 2/9  Decreased Interest 1 3 1   Down, Depressed, Hopeless 1 3 1   PHQ - 2 Score 2 6 2   Altered sleeping 1 3 1   Tired, decreased energy 3 3 3   Change in appetite 3 3 2   Feeling bad or failure about yourself  1 3 1   Trouble concentrating 0 2 1  Moving  slowly or fidgety/restless 0 2 0  Suicidal thoughts 0 1 0  PHQ-9 Score 10  23  10    Difficult doing work/chores Very difficult Extremely dIfficult Very difficult     Data saved with a previous flowsheet row definition      07/25/2022    3:27 PM 07/31/2020   12:02 PM 05/07/2015   10:45 AM  GAD 7 : Generalized Anxiety Score  Nervous, Anxious, on Edge 1    Control/stop worrying 1    Worry too much - different things 1    Trouble relaxing 1    Restless 0    Easily annoyed or irritable 1    Afraid - awful might happen 0    Total GAD 7 Score 5    Anxiety Difficulty Somewhat difficult       Information is confidential and restricted. Go to Review Flowsheets to unlock data.     Results for orders placed or performed in visit on 06/28/24  POCT Urinalysis Dipstick (Automated)  Result Value Ref Range   Color, UA yellow    Clarity, UA Cloudy    Glucose, UA Negative Negative   Bilirubin, UA Positive (A)    Ketones, UA Negative    Spec Grav, UA 1.015 1.010 - 1.025   Blood, UA Positive    pH, UA 6.5 5.0 - 8.0   Protein, UA Positive (A) Negative   Urobilinogen, UA 0.2 0.2 or 1.0 E.U./dL   Nitrite, UA neg    Leukocytes, UA Large (3+) (A) Negative      Assessment & Plan:   Dysuria -     POCT Urinalysis Dipstick (Automated) -     Urine Culture -     Nitrofurantoin  Monohyd Macro; Take 1 capsule (100 mg total) by mouth 2 (two) times daily for 7 days.  Dispense: 14 capsule; Refill: 0  Abnormal urine odor -     POCT Urinalysis Dipstick (Automated)   Acute urinary sx concerning for UTI.  POC UA with RBCs, 3+ leuks, no nitrites.  Obtain Ucx.  Start abx while awaiting results.  Increase intake of fluids.  Further recs if needed based on results.  Given strict precautions.  Return if symptoms worsen or fail to improve.   Clotilda JONELLE Single, MD    [1]  Social History Tobacco Use   Smoking status: Never   Smokeless tobacco: Never  Vaping Use   Vaping status: Never Used  Substance  Use Topics   Alcohol use: Yes    Alcohol/week: 1.0 standard drink of alcohol    Types: 1 Glasses of wine per week    Comment: maybe 1-2 monthly   Drug use: No  [2]  Allergies Allergen Reactions   Wellbutrin  [Bupropion ] Other (See Comments)    Worsened migraines   "

## 2024-06-30 ENCOUNTER — Ambulatory Visit: Payer: Self-pay | Admitting: Family Medicine

## 2024-06-30 LAB — URINE CULTURE
MICRO NUMBER:: 17449163
SPECIMEN QUALITY:: ADEQUATE

## 2024-07-01 ENCOUNTER — Telehealth: Payer: Self-pay

## 2024-07-01 ENCOUNTER — Ambulatory Visit: Admitting: Physician Assistant

## 2024-07-01 ENCOUNTER — Encounter: Payer: Self-pay | Admitting: Physician Assistant

## 2024-07-01 ENCOUNTER — Ambulatory Visit

## 2024-07-01 VITALS — BP 120/74 | HR 83

## 2024-07-01 DIAGNOSIS — F329 Major depressive disorder, single episode, unspecified: Secondary | ICD-10-CM

## 2024-07-01 DIAGNOSIS — F411 Generalized anxiety disorder: Secondary | ICD-10-CM

## 2024-07-01 DIAGNOSIS — F515 Nightmare disorder: Secondary | ICD-10-CM

## 2024-07-01 DIAGNOSIS — F5105 Insomnia due to other mental disorder: Secondary | ICD-10-CM

## 2024-07-01 NOTE — Telephone Encounter (Signed)
 Yolanda Wolfe asked me to call pharmacy about her Lunesta . Needs a PA, said they sent fax. Insurance will only cover 45 every 75 days (?) and she has capped out.

## 2024-07-01 NOTE — Progress Notes (Signed)
 "     [facility]  Patient ID: Yolanda Wolfe,  MRN: [id number]  PCP: Jordan, Betty G, MD  Date of Evaluation: 07/01/2024 Time spent:65 minutes  Chief Complaint:  Chief Complaint   Depression; Other     HISTORY/CURRENT STATUS: HPI  For Spravato  treatment  Hasn't been sleeping as well.  Out of Lunesta  for a week. For some reason the pharmacy won't dispense it. Even with it, she doesn't feel well rested when she gets up.  Still extremely hard for her to get out of bed and go to work.  Has an appt in the next few months w/ a sleep med provider. Nightmares are better.   Depression sx are controlled.  Energy and motivation are good most of the time.  No extreme sadness, tearfulness, or feelings of hopelessness.  ADLs and personal hygiene are normal.   No changes in memory.  Appetite has not changed.  Anxiety is controlled.   No mania, delirium, AH/VH.  No SI/HI.  Individual Medical History/ Review of Systems: Changes? :No     Past medications for mental health diagnoses include: Amitriptylline for sleep? Celexa,  Zoloft , Cymbalta , Wellbutrin  caused migraines to worsen,  Lunesta , Ativan , Belsomra , Temazapam,  trazodone  not effective at low doses but at 100 mg she was too groggy, Mirtazapine  caused extreme wt gain, Sonata  ineffective, Ambien   Topamax  for migraines, somewhat effective  Allergies: Wellbutrin  [bupropion ]  Current Medications:  Current Outpatient Medications:    ALPRAZolam  (XANAX ) 1 MG tablet, TAKE 2 TABLETS (2 MG TOTAL) BY MOUTH AT BEDTIME, Disp: 60 tablet, Rfl: 5   Atogepant  (QULIPTA ) 60 MG TABS, Take 1 tablet (60 mg total) by mouth daily., Disp: 30 tablet, Rfl: 11   botulinum toxin Type A  (BOTOX ) 200 units injection, Inject 155 units IM into multiple site in the face,neck and head once every 90 days, Disp: 1 each, Rfl: 4   DOTTI 0.05 MG/24HR patch, APPLY 1 PATCH TWICE A WEEK TRANSDERMALLY FOR 28 DAYS, Disp: , Rfl:    DULoxetine  (CYMBALTA ) 60 MG capsule,  Take 2 capsules (120 mg total) by mouth daily., Disp: 180 capsule, Rfl: 1   Esketamine HCl, 84 MG Dose, (SPRAVATO , 84 MG DOSE,) 28 MG/DEVICE SOPK, Place 84 mg into the nose every 3 (three) days., Disp: , Rfl:    Eszopiclone  3 MG TABS, TAKE 1 TABLET (3 MG TOTAL) BY MOUTH AT BEDTIME AS NEEDED., Disp: 30 tablet, Rfl: 5   hydrOXYzine  (ATARAX ) 25 MG tablet, TAKE 1 TABLET FOR SLEEP AS NEEDED, Disp: 90 tablet, Rfl: 1   lurasidone  (LATUDA ) 80 MG TABS tablet, Take 1 tablet (80 mg total) by mouth daily with breakfast., Disp: 90 tablet, Rfl: 1   Methylfol-Algae-B12-Acetylcyst (CEREFOLIN NAC) 6-90.314-2-600 MG TABS, Take 1 tablet by mouth daily., Disp: 30 tablet, Rfl: 11   modafinil  (PROVIGIL ) 200 MG tablet, TAKE 0.5-1 TABLETS (100-200 MG TOTAL) BY MOUTH EVERY MORNING., Disp: 30 tablet, Rfl: 5   prazosin  (MINIPRESS ) 2 MG capsule, Take 2 capsules (4 mg total) by mouth at bedtime., Disp: 60 capsule, Rfl: 1   progesterone (PROMETRIUM) 100 MG capsule, Take 100 mg by mouth daily., Disp: , Rfl:    Rimegepant Sulfate (NURTEC) 75 MG TBDP, Take 1 tablet (75 mg total) by mouth daily as needed., Disp: 16 tablet, Rfl: 11   Tirzepatide (MOUNJARO [place]), Inject into the skin., Disp: , Rfl:    tretinoin (RETIN-A) 0.1 % cream, , Disp: , Rfl:    nitrofurantoin , macrocrystal-monohydrate, (MACROBID ) 100 MG capsule, Take 1  capsule (100 mg total) by mouth 2 (two) times daily for 7 days., Disp: 14 capsule, Rfl: 0 Medication Side Effects: H/A w/ Wellbutrin , Sexual dysfunction with Cymbalta    Family Medical/ Social History: Changes? no  MENTAL HEALTH EXAM:  There were no vitals taken for this visit.There is no height or weight on file to calculate BMI.  General Appearance: Casual and Well Groomed  Eye Contact:  Good  Speech:  Clear and Coherent and Normal Rate  Volume:  Normal  Mood:  Euthymic  Affect:  Congruent  Thought Process:  Goal Directed and Descriptions of Associations: Circumstantial  Orientation:  Full (Time, Place,  and Person)  Thought Content: Logical   Suicidal Thoughts:  No  Homicidal Thoughts:  No  Memory:  WNL  Judgement:  Good  Insight:  Good  Psychomotor Activity:  Normal  Concentration:  Concentration: Good and Attention Span: Good  Recall:  Good  Fund of Knowledge: Good  Language: Good  Assets:  Communication Skills Desire for Improvement Financial Resources/Insurance Housing Resilience Social Support Transportation Vocational/Educational  ADL's:  Intact  Cognition: WNL  Prognosis:  Good   Dr. Jordan is following labs.   GeneSight test results are in chart under Labs from 08/05/2020, had moderately reduced folic acid conversion  Patient was administered Spravato  84 mg intranasally today.  The patient experienced the typical dissociation which gradually resolved over the 2-hour period of observation.  There were no complications.  Specifically the patient did not have nausea or vomiting or headache.  Pulse ox and lood pressures remained within normal ranges at the 40-minute and 2-hour follow-up intervals.  By the time the 2-hour observation period was met the patient was alert and oriented and able to exit without assistance.  Patient feels the Spravato  administration is helpful for the treatment resistant depression and would like to continue the treatment.  See nursing note for further details.   ECT-MADRS    Flowsheet Row Clinical Support from 05/22/2024 in [facility] Office Visit from 02/27/2024 in [facility] Office Visit from 10/20/2023 in [facility] Office Visit from 08/31/2023 in [facility]  MADRS Total Score 19 34 17 38   GAD-7    Flowsheet Row Office Visit from 07/25/2022 in [facility] [place] Office Visit from 07/31/2020 in [facility]  Total GAD-7 Score 5 13   PHQ2-9    Flowsheet Row Office Visit from  07/25/2022 in [facility] [place] Office Visit from 03/30/2022 in [facility] [place] Video Visit from 02/25/2022 in [facility] [place] Office Visit from 07/31/2020 in [facility]  PHQ-2 Total Score 2 6 2 1   PHQ-9 Total Score 10 23 10 9    DIAGNOSES:    ICD-10-CM   1. Treatment-resistant depression  F32.9     2. Generalized anxiety disorder  F41.1     3. Insomnia due to other mental disorder  F51.05    F99     4. Nightmares  F51.5       Receiving Psychotherapy: Yes with Marval Bunde, LCSW  RECOMMENDATIONS:  PDMP was reviewed.  Lunesta  filled 05/26/2024.  Modafinil  filled 06/26/2024.  Xanax  filled 06/26/2024. I provided approximately  65 minutes of face to face time during this encounter, including time spent before and after the visit in records review, medical decision making, counseling pertinent to today's visit, and charting.  Will call her pharmacy to have them dispense the Lunesta . She will see sleep medicine for eval in March I think. In the meantime, no other changes.    Her mood has improved on the Spravato  so will continue twice weekly treatments.   Continue Xanax  1 mg, 2 p.o. nightly as needed. Continue Cymbalta   60 mg, 2 every day.  Continue Spravato  84 mg twice a week. Continue Cerafolin NAC, 1 daily. Continue Lunesta  3 mg, 1 p.o. nightly. Continue hydroxyzine  25 mg, 1 p.o. nightly as needed. Continue  Latuda   80 mg, 1 p.o. q. evening with food. Continue modafinil  200 mg, 1/2-1 p.o. every morning. Continue prazosin   4 mg at bedtime.  Continue therapy with Marval Bunde, LCSW. Return in 2 days.  Verneita Cooks, PA-C  "

## 2024-07-02 NOTE — Telephone Encounter (Signed)
Patient notified of PA approval

## 2024-07-02 NOTE — Telephone Encounter (Signed)
 PA approved for Eszopiclone  3 mg tablets 07/02/24-07/02/25 [employer]

## 2024-07-02 NOTE — Telephone Encounter (Signed)
 Noted

## 2024-07-02 NOTE — Telephone Encounter (Signed)
 Noted thanks did not receive a PA request but will initiate one.

## 2024-07-03 ENCOUNTER — Ambulatory Visit: Admitting: Physician Assistant

## 2024-07-03 ENCOUNTER — Other Ambulatory Visit: Payer: Self-pay

## 2024-07-03 ENCOUNTER — Ambulatory Visit

## 2024-07-03 ENCOUNTER — Encounter: Payer: Self-pay | Admitting: Physician Assistant

## 2024-07-03 VITALS — BP 117/77 | HR 74

## 2024-07-03 DIAGNOSIS — F329 Major depressive disorder, single episode, unspecified: Secondary | ICD-10-CM | POA: Diagnosis not present

## 2024-07-03 DIAGNOSIS — F411 Generalized anxiety disorder: Secondary | ICD-10-CM

## 2024-07-03 DIAGNOSIS — F515 Nightmare disorder: Secondary | ICD-10-CM

## 2024-07-03 DIAGNOSIS — F5105 Insomnia due to other mental disorder: Secondary | ICD-10-CM

## 2024-07-03 MED ORDER — CEREFOLIN NAC 6-90.314-2-600 MG PO TABS: 1.0000 | ORAL_TABLET | Freq: Every day | ORAL | 11 refills | Status: AC

## 2024-07-03 NOTE — Progress Notes (Signed)
 NURSES NOTE:         Pt arrived for her 20th Spravato  Treatment for treatment resistant depression, the starting dose was 56 mg (2 of the 28 mg nasal sprays), pt tolerated that dose well she will receive 84 mg today which will be her maintenance dose. Pt reports she did well after her first treatment and feels good to increase her dose today. Pt sees Verneita Cooks, [place] and she will follow up with her at each treatment. Explained to pt how the treatments would be scheduled and answered any questions she had today. She was given a practice nasal trainer to try a few times before given the one with medication. She verbalized understanding. Pt's Spravato  is billed through buy and bill medically.  Spravato  medication is stored at treatment center per REMS/FDA guidelines. The medication is required to be locked behind two doors per REMS/FDA protocol. Medication is also disposed of properly after each use per regulations. All documentation for REMS is completed and submitted per FDA/REMS requirements.          Began taking patient's vital signs at 3:13 PM 128/81, pulse 73. SpO2 Sat. 99%. Stable to proceed with treatment. Instructed patient to blow her nose if needed then recline back to a 45 degree angle. Gave patient first dose 28 mg nasal spray, administered in each nostril as directed and observed by nurse, waited 5 more minutes for the second and third dose. After all doses given pt did not complain of any nausea/vomiting, pt did have a drink to help with the taste of Spravato  it gives the metallic taste. Assessed her 40 minute vitals, 3:53 PM, 116/73, pulse 72, SpO2 97%. Explained she would be monitored for a total time of 120 minutes. Discharge vitals were taken at 5:06 PM 120/74, P 83, SpO2 98%. Verneita came to visit with patient once her thoughts were clearer to discuss how treatment went. Recommend she go home and sleep or just relax on the couch. No driving, no intense activities. Verbalized  understanding. Nurse was with pt a total of 60 minutes for clinical assessment. Pt will continue 84 mg for her treatments. Instructed to call with any issues prior to her next apts on Monday and Wednesday.   (84 mg)  LOT [id number] EXP MARCH 2028

## 2024-07-03 NOTE — Progress Notes (Signed)
 "     [facility]  Patient ID: Yolanda Wolfe,  MRN: [id number]  PCP: Jordan, Betty G, MD  Date of Evaluation: 07/03/2024 Time spent:65 minutes  Chief Complaint:  Chief Complaint   Depression; Other    HISTORY/CURRENT STATUS: HPI  For Spravato  treatment  Doing well.  Feels like the current treatment is still effective.  Energy and motivation are pretty good.  The Modafinil  has been helpful with that.  Work is going well.   No extreme sadness, tearfulness, or feelings of hopelessness.  Sleeps well with the current' meds.  If she doesn't take the Sonata , she doesn't sleep well at all.  Nightmares are better with the increased Prazosin .  ADLs and personal hygiene are normal.   Denies any changes in concentration, making decisions, or remembering things.  Appetite has not changed.  Weight is stable.  Anxiety is well controlled.  No mania, delirium, AH/VH.  No SI/HI.  Individual Medical History/ Review of Systems: Changes? :No     Past medications for mental health diagnoses include: Amitriptylline for sleep? Celexa,  Zoloft , Cymbalta , Wellbutrin  caused migraines to worsen,  Lunesta , Ativan , Belsomra , Temazapam,  trazodone  not effective at low doses but at 100 mg she was too groggy, Mirtazapine  caused extreme wt gain, Sonata  ineffective, Ambien   Topamax  for migraines, somewhat effective  Allergies: Wellbutrin  [bupropion ]  Current Medications:  Current Outpatient Medications:    ALPRAZolam  (XANAX ) 1 MG tablet, TAKE 2 TABLETS (2 MG TOTAL) BY MOUTH AT BEDTIME, Disp: 60 tablet, Rfl: 5   Atogepant  (QULIPTA ) 60 MG TABS, Take 1 tablet (60 mg total) by mouth daily., Disp: 30 tablet, Rfl: 11   botulinum toxin Type A  (BOTOX ) 200 units injection, Inject 155 units IM into multiple site in the face,neck and head once every 90 days, Disp: 1 each, Rfl: 4   DOTTI 0.05 MG/24HR patch, APPLY 1 PATCH TWICE A WEEK TRANSDERMALLY FOR 28 DAYS, Disp: , Rfl:    DULoxetine  (CYMBALTA ) 60 MG  capsule, Take 2 capsules (120 mg total) by mouth daily., Disp: 180 capsule, Rfl: 1   Esketamine HCl, 84 MG Dose, (SPRAVATO , 84 MG DOSE,) 28 MG/DEVICE SOPK, Place 84 mg into the nose every 3 (three) days., Disp: , Rfl:    Eszopiclone  3 MG TABS, TAKE 1 TABLET (3 MG TOTAL) BY MOUTH AT BEDTIME AS NEEDED., Disp: 30 tablet, Rfl: 5   hydrOXYzine  (ATARAX ) 25 MG tablet, TAKE 1 TABLET FOR SLEEP AS NEEDED, Disp: 90 tablet, Rfl: 1   lurasidone  (LATUDA ) 80 MG TABS tablet, Take 1 tablet (80 mg total) by mouth daily with breakfast., Disp: 90 tablet, Rfl: 1   modafinil  (PROVIGIL ) 200 MG tablet, TAKE 0.5-1 TABLETS (100-200 MG TOTAL) BY MOUTH EVERY MORNING., Disp: 30 tablet, Rfl: 5   prazosin  (MINIPRESS ) 2 MG capsule, Take 2 capsules (4 mg total) by mouth at bedtime., Disp: 60 capsule, Rfl: 1   progesterone (PROMETRIUM) 100 MG capsule, Take 100 mg by mouth daily., Disp: , Rfl:    Rimegepant Sulfate (NURTEC) 75 MG TBDP, Take 1 tablet (75 mg total) by mouth daily as needed., Disp: 16 tablet, Rfl: 11   Tirzepatide (MOUNJARO [place]), Inject into the skin., Disp: , Rfl:    tretinoin (RETIN-A) 0.1 % cream, , Disp: , Rfl:    Methylfol-Algae-B12-Acetylcyst (CEREFOLIN NAC) 6-90.314-2-600 MG TABS, Take 1 tablet by mouth daily., Disp: 30 tablet, Rfl: 11 Medication Side Effects: H/A w/ Wellbutrin , Sexual dysfunction with Cymbalta    Family Medical/ Social History: Changes? no  MENTAL HEALTH  EXAM:  There were no vitals taken for this visit.There is no height or weight on file to calculate BMI.  General Appearance: Casual and Well Groomed  Eye Contact:  Good  Speech:  Clear and Coherent and Normal Rate  Volume:  Normal  Mood:  Euthymic  Affect:  Congruent  Thought Process:  Goal Directed and Descriptions of Associations: Circumstantial  Orientation:  Full (Time, Place, and Person)  Thought Content: Logical   Suicidal Thoughts:  No  Homicidal Thoughts:  No  Memory:  WNL  Judgement:  Good  Insight:  Good  Psychomotor  Activity:  Normal  Concentration:  Concentration: Good and Attention Span: Good  Recall:  Good  Fund of Knowledge: Good  Language: Good  Assets:  Communication Skills Desire for Improvement Financial Resources/Insurance Housing Resilience Transportation Vocational/Educational  ADL's:  Intact  Cognition: WNL  Prognosis:  Good   Dr. Jordan is following labs.   GeneSight test results are in chart under Labs from 08/05/2020, had moderately reduced folic acid conversion  Patient was administered Spravato  84 mg intranasally today.  The patient experienced the typical dissociation which gradually resolved over the 2-hour period of observation.  There were no complications.  Specifically the patient did not have nausea or vomiting or headache.  Pulse ox and blood pressures remained within normal ranges at the 40-minute and 2-hour follow-up intervals.  By the time the 2-hour observation period was met the patient was alert and oriented and able to exit without assistance.  Patient feels the Spravato  administration is helpful for the treatment resistant depression and would like to continue the treatment.  See nursing note for further details.   ECT-MADRS    Flowsheet Row Clinical Support from 05/22/2024 in [facility] Office Visit from 02/27/2024 in [facility] Office Visit from 10/20/2023 in [facility] Office Visit from 08/31/2023 in [facility]  MADRS Total Score 19 34 17 38   GAD-7    Flowsheet Row Office Visit from 07/25/2022 in [facility] [place] Office Visit from 07/31/2020 in [facility]  Total GAD-7 Score 5 13   PHQ2-9    Flowsheet Row Office Visit from 07/25/2022 in [facility] [place] Office Visit from 03/30/2022 in [facility] [place] Video Visit from 02/25/2022 in [facility] [place] Office Visit from 07/31/2020 in [facility]  PHQ-2 Total Score 2 6 2 1   PHQ-9 Total Score 10 23 10 9    DIAGNOSES:    ICD-10-CM   1. Treatment-resistant depression  F32.9     2. Generalized anxiety disorder  F41.1     3. Insomnia due to other mental disorder  F51.05    F99     4. Nightmares  F51.5       Receiving Psychotherapy: Yes with Marval Bunde, LCSW  RECOMMENDATIONS:  PDMP was reviewed.  Lunesta  filled 07/02/2024.  Modafinil  filled 06/26/2024.  Xanax  filled 06/26/2024. I provided approximately 65 minutes of face to face time during this encounter, including time spent before and after the visit in records review, medical decision making, counseling pertinent to today's visit, and charting.   She's responding well to the Spravato  and other meds.  No changes are needed.   Continue Xanax  1 mg, 2 p.o. nightly as needed. Continue Cymbalta   60 mg, 2 every day.  Continue Spravato  84 mg twice a week.  Continue Cerafolin NAC, 1 daily. Continue Lunesta  3 mg, 1 p.o. nightly. Continue hydroxyzine  25 mg, 1 p.o. nightly as needed. Continue  Latuda   80 mg, 1 p.o. q. evening with food. Continue modafinil  200 mg, 1/2-1 p.o. every morning. Continue prazosin   4 mg at bedtime.  Continue therapy with Marval Bunde, LCSW. Return in 5 days.  Verneita Cooks, PA-C  "

## 2024-07-03 NOTE — Progress Notes (Signed)
 NURSES NOTE:         Pt arrived for  #21 Spravato  Treatment for treatment resistant depression, the starting dose was 56 mg (2 of the 28 mg nasal sprays), pt tolerated that dose well she will receive 84 mg today which will be her maintenance dose. Pt reports she did well after her first treatment and feels good to increase her dose today. Pt sees Verneita Cooks, [place] and she will follow up with her at each treatment. Explained to pt how the treatments would be scheduled and answered any questions she had today. She was given a practice nasal trainer to try a few times before given the one with medication. She verbalized understanding. Pt's Spravato  is billed through buy and bill medically.  Spravato  medication is stored at treatment center per REMS/FDA guidelines. The medication is required to be locked behind two doors per REMS/FDA protocol. Medication is also disposed of properly after each use per regulations. All documentation for REMS is completed and submitted per FDA/REMS requirements.          Began taking patient's vital signs at 3:05 PM 117/75, pulse 74. SpO2 Sat. 98%. Stable to proceed with treatment. Instructed patient to blow her nose if needed then recline back to a 45 degree angle. Gave patient first dose 28 mg nasal spray, administered in each nostril as directed and observed by nurse, waited 5 more minutes for the second and third dose. After all doses given pt did not complain of any nausea/vomiting, pt did have a drink to help with the taste of Spravato  it gives the metallic taste. Assessed her 40 minute vitals, 3:45 PM, 122/72, pulse 70, SpO2 99%. Explained she would be monitored for a total time of 120 minutes. Discharge vitals were taken at 5:11 PM 117/77, P 74, SpO2 98%. Verneita came to visit with patient once her thoughts were clearer to discuss how treatment went. Recommend she go home and sleep or just relax on the couch. No driving, no intense activities. Verbalized understanding.  Nurse was with pt a total of 60 minutes for clinical assessment. Pt will continue 84 mg for her treatments. Instructed to call with any issues prior to her next apts on Monday and Wednesday.   (84 mg)  LOT [id number] EXP MARCH 2028

## 2024-07-07 ENCOUNTER — Encounter: Payer: Self-pay | Admitting: Physician Assistant

## 2024-07-08 ENCOUNTER — Ambulatory Visit: Admitting: Physician Assistant

## 2024-07-08 ENCOUNTER — Encounter: Payer: Self-pay | Admitting: Physician Assistant

## 2024-07-08 ENCOUNTER — Ambulatory Visit

## 2024-07-08 VITALS — BP 118/75 | HR 76

## 2024-07-08 DIAGNOSIS — F411 Generalized anxiety disorder: Secondary | ICD-10-CM

## 2024-07-08 DIAGNOSIS — F329 Major depressive disorder, single episode, unspecified: Secondary | ICD-10-CM

## 2024-07-08 DIAGNOSIS — F518 Other sleep disorders not due to a substance or known physiological condition: Secondary | ICD-10-CM

## 2024-07-08 DIAGNOSIS — F99 Mental disorder, not otherwise specified: Secondary | ICD-10-CM | POA: Diagnosis not present

## 2024-07-08 DIAGNOSIS — F5105 Insomnia due to other mental disorder: Secondary | ICD-10-CM

## 2024-07-08 NOTE — Progress Notes (Signed)
 NURSES NOTE:         Pt arrived for  #22 Spravato  Treatment for treatment resistant depression, the starting dose was 56 mg (2 of the 28 mg nasal sprays), pt tolerated that dose well she will receive 84 mg today which will be her maintenance dose. Pt reports she did well after her first treatment and feels good to increase her dose today. Pt sees Verneita Cooks, [place] and she will follow up with her at each treatment. Explained to pt how the treatments would be scheduled and answered any questions she had today. She was given a practice nasal trainer to try a few times before given the one with medication. She verbalized understanding. Pt's Spravato  is billed through buy and bill medically.  Spravato  medication is stored at treatment center per REMS/FDA guidelines. The medication is required to be locked behind two doors per REMS/FDA protocol. Medication is also disposed of properly after each use per regulations. All documentation for REMS is completed and submitted per FDA/REMS requirements.          Began taking patient's vital signs at 3:15 PM 130/80, pulse 75. SpO2 Sat. 99%. Stable to proceed with treatment. Instructed patient to blow her nose if needed then recline back to a 45 degree angle. Gave patient first dose 28 mg nasal spray, administered in each nostril as directed and observed by nurse, waited 5 more minutes for the second and third dose. After all doses given pt did not complain of any nausea/vomiting, pt did have a drink to help with the taste of Spravato  it gives the metallic taste. Assessed her 40 minute vitals, 3:58 PM, 120/73, pulse 73, SpO2 98%. Explained she would be monitored for a total time of 120 minutes. Discharge vitals were taken at 5:06 PM 118/75, P 76, SpO2 98%. Verneita came to visit with patient once her thoughts were clearer to discuss how treatment went. Recommend she go home and sleep or just relax on the couch. No driving, no intense activities. Verbalized understanding.  Nurse was with pt a total of 60 minutes for clinical assessment. Pt will continue 84 mg for her treatments. Instructed to call with any issues prior to her next apts on Monday and Wednesday.   (84 mg)  LOT [id number] EXP MARCH 2028

## 2024-07-08 NOTE — Progress Notes (Signed)
 "     [facility]  Patient ID: Melvin Marmo,  MRN: [id number]  PCP: Jordan, Betty G, MD  Date of Evaluation: 07/08/2024 Time spent:60 minutes  Chief Complaint:  Chief Complaint   Depression; Other     HISTORY/CURRENT STATUS: HPI  For Spravato  treatment  Lindee is still doing well.  She feels like the Spravato  is still effective.  She has had a little anxiety and depression with going back to school last week.  She is a professor at [employer].  1 day last week she was 2 hours late going in for office hours.  That is the only time it happened though.  She was not dreading it too much today.  She has been a little more active doing things around the house.  Appetite is normal.  Personal hygiene is normal.  No feelings of hopelessness.  She sleeps pretty well.  Still has abnormal dreams at times but not nightmares like she was having before we moved up the prazosin  dose.  No suicidal or homicidal thoughts.  Individual Medical History/ Review of Systems: Changes? :No     Past medications for mental health diagnoses include: Amitriptylline for sleep? Celexa,  Zoloft , Cymbalta , Wellbutrin  caused migraines to worsen,  Lunesta , Ativan , Belsomra , Temazapam,  trazodone  not effective at low doses but at 100 mg she was too groggy, Mirtazapine  caused extreme wt gain, Sonata  ineffective, Ambien   Topamax  for migraines, somewhat effective  Allergies: Wellbutrin  [bupropion ]  Current Medications:  Current Outpatient Medications:    ALPRAZolam  (XANAX ) 1 MG tablet, TAKE 2 TABLETS (2 MG TOTAL) BY MOUTH AT BEDTIME, Disp: 60 tablet, Rfl: 5   Atogepant  (QULIPTA ) 60 MG TABS, Take 1 tablet (60 mg total) by mouth daily., Disp: 30 tablet, Rfl: 11   botulinum toxin Type A  (BOTOX ) 200 units injection, Inject 155 units IM into multiple site in the face,neck and head once every 90 days, Disp: 1 each, Rfl: 4   DOTTI 0.05 MG/24HR patch, APPLY 1 PATCH TWICE A WEEK TRANSDERMALLY FOR 28  DAYS, Disp: , Rfl:    DULoxetine  (CYMBALTA ) 60 MG capsule, Take 2 capsules (120 mg total) by mouth daily., Disp: 180 capsule, Rfl: 1   Esketamine HCl, 84 MG Dose, (SPRAVATO , 84 MG DOSE,) 28 MG/DEVICE SOPK, Place 84 mg into the nose every 3 (three) days., Disp: , Rfl:    Eszopiclone  3 MG TABS, TAKE 1 TABLET (3 MG TOTAL) BY MOUTH AT BEDTIME AS NEEDED., Disp: 30 tablet, Rfl: 5   hydrOXYzine  (ATARAX ) 25 MG tablet, TAKE 1 TABLET FOR SLEEP AS NEEDED, Disp: 90 tablet, Rfl: 1   lurasidone  (LATUDA ) 80 MG TABS tablet, Take 1 tablet (80 mg total) by mouth daily with breakfast., Disp: 90 tablet, Rfl: 1   Methylfol-Algae-B12-Acetylcyst (CEREFOLIN NAC) 6-90.314-2-600 MG TABS, Take 1 tablet by mouth daily., Disp: 30 tablet, Rfl: 11   modafinil  (PROVIGIL ) 200 MG tablet, TAKE 0.5-1 TABLETS (100-200 MG TOTAL) BY MOUTH EVERY MORNING., Disp: 30 tablet, Rfl: 5   prazosin  (MINIPRESS ) 2 MG capsule, Take 2 capsules (4 mg total) by mouth at bedtime., Disp: 60 capsule, Rfl: 1   progesterone (PROMETRIUM) 100 MG capsule, Take 100 mg by mouth daily., Disp: , Rfl:    Rimegepant Sulfate (NURTEC) 75 MG TBDP, Take 1 tablet (75 mg total) by mouth daily as needed., Disp: 16 tablet, Rfl: 11   Tirzepatide (MOUNJARO [place]), Inject into the skin., Disp: , Rfl:    tretinoin (RETIN-A) 0.1 % cream, , Disp: , Rfl:  Medication Side Effects: H/A w/ Wellbutrin , Sexual dysfunction with Cymbalta    Family Medical/ Social History: Changes? no  MENTAL HEALTH EXAM:  There were no vitals taken for this visit.There is no height or weight on file to calculate BMI.  General Appearance: Casual and Well Groomed  Eye Contact:  Good  Speech:  Clear and Coherent and Normal Rate  Volume:  Normal  Mood:  Euthymic  Affect:  Congruent  Thought Process:  Goal Directed and Descriptions of Associations: Circumstantial  Orientation:  Full (Time, Place, and Person)  Thought Content: Logical   Suicidal Thoughts:  No  Homicidal Thoughts:  No  Memory:  WNL   Judgement:  Good  Insight:  Good  Psychomotor Activity:  Normal  Concentration:  Concentration: Good and Attention Span: Good  Recall:  Good  Fund of Knowledge: Good  Language: Good  Assets:  Communication Skills Desire for Improvement Financial Resources/Insurance Housing Resilience Social Support Transportation Vocational/Educational  ADL's:  Intact  Cognition: WNL  Prognosis:  Good   Dr. Jordan is following labs.   GeneSight test results are in chart under Labs from 08/05/2020, had moderately reduced folic acid conversion  Patient was administered Spravato  84 mg intranasally today.  The patient experienced the typical dissociation which gradually resolved over the 2-hour period of observation.  There were no complications.  Specifically the patient did not have nausea or vomiting or headache.  Pulse ox and blood pressures remained within normal ranges at the 40-minute and 2-hour follow-up intervals.  By the time the 2-hour observation period was met the patient was alert and oriented and able to exit without assistance.  Patient feels the Spravato  administration is helpful for the treatment resistant depression and would like to continue the treatment.  See nursing note for further details.   ECT-MADRS    Flowsheet Row Clinical Support from 05/22/2024 in [facility] Office Visit from 02/27/2024 in [facility] Office Visit from 10/20/2023 in [facility] Office Visit from 08/31/2023 in [facility]  MADRS Total Score 19 34 17 38   GAD-7    Flowsheet Row Office Visit from 07/25/2022 in [facility] [place] Office Visit from 07/31/2020 in [facility]  Total GAD-7 Score 5 13   PHQ2-9    Flowsheet Row Office Visit from 07/25/2022 in [facility] [place] Office Visit from 03/30/2022 in [facility] [place] Video Visit from 02/25/2022 in [facility] [place] [facility] [place] Office Visit from 07/31/2020 in [facility]  PHQ-2 Total Score 2 6 2 1   PHQ-9 Total Score 10 23 10 9    DIAGNOSES:    ICD-10-CM   1. Treatment-resistant depression  F32.9     2. Generalized anxiety disorder  F41.1     3. Insomnia due to other mental disorder  F51.05    F99     4. Abnormal dreams  F51.8       Receiving Psychotherapy: Yes with Marval Bunde, LCSW  RECOMMENDATIONS:  PDMP was reviewed.  Lunesta  filled 07/02/2024.  Modafinil  filled 06/26/2024.  Xanax  filled 06/26/2024. I provided approximately 60 minutes of face to face time during this encounter, including time spent before and after the visit in records review, medical decision making, counseling pertinent to today's visit, and charting.   She is doing well so no changes in treatment are needed.  Continue Xanax  1 mg, 2  p.o. nightly as needed. Continue Cymbalta   60 mg, 2 every day.  Continue Spravato  84 mg twice a week. Continue Cerafolin NAC, 1 daily. Continue Lunesta  3 mg, 1 p.o. nightly. Continue hydroxyzine  25 mg, 1 p.o. nightly as needed. Continue  Latuda   80 mg, 1 p.o. q. evening with food. Continue modafinil  200 mg, 1/2-1 p.o. every morning. Continue prazosin   4 mg at bedtime.  Continue therapy with Marval Bunde, LCSW. Return in 2 days.  Verneita Cooks, PA-C  "

## 2024-07-10 ENCOUNTER — Ambulatory Visit: Admitting: Physician Assistant

## 2024-07-10 ENCOUNTER — Encounter: Payer: Self-pay | Admitting: Physician Assistant

## 2024-07-10 ENCOUNTER — Ambulatory Visit

## 2024-07-10 VITALS — BP 122/77 | HR 82

## 2024-07-10 DIAGNOSIS — F5105 Insomnia due to other mental disorder: Secondary | ICD-10-CM

## 2024-07-10 DIAGNOSIS — F99 Mental disorder, not otherwise specified: Secondary | ICD-10-CM | POA: Diagnosis not present

## 2024-07-10 DIAGNOSIS — F518 Other sleep disorders not due to a substance or known physiological condition: Secondary | ICD-10-CM | POA: Diagnosis not present

## 2024-07-10 DIAGNOSIS — F329 Major depressive disorder, single episode, unspecified: Secondary | ICD-10-CM

## 2024-07-10 DIAGNOSIS — F411 Generalized anxiety disorder: Secondary | ICD-10-CM

## 2024-07-10 NOTE — Progress Notes (Unsigned)
 NURSES NOTE:         Pt arrived for  #23 Spravato  Treatment for treatment resistant depression, the starting dose was 56 mg (2 of the 28 mg nasal sprays), pt tolerated that dose well she will receive 84 mg today which will be her maintenance dose. Pt reports she did well after her first treatment and feels good to increase her dose today. Pt sees Verneita Cooks, [place] and she will follow up with her at each treatment. Explained to pt how the treatments would be scheduled and answered any questions she had today. She was given a practice nasal trainer to try a few times before given the one with medication. She verbalized understanding. Pt's Spravato  is billed through buy and bill medically.  Spravato  medication is stored at treatment center per REMS/FDA guidelines. The medication is required to be locked behind two doors per REMS/FDA protocol. Medication is also disposed of properly after each use per regulations. All documentation for REMS is completed and submitted per FDA/REMS requirements.          Began taking patient's vital signs at 3:15 PM 130/80, pulse 75. SpO2 Sat. 99%. Stable to proceed with treatment. Instructed patient to blow her nose if needed then recline back to a 45 degree angle. Gave patient first dose 28 mg nasal spray, administered in each nostril as directed and observed by nurse, waited 5 more minutes for the second and third dose. After all doses given pt did not complain of any nausea/vomiting, pt did have a drink to help with the taste of Spravato  it gives the metallic taste. Assessed her 40 minute vitals, 3:58 PM, 120/73, pulse 73, SpO2 98%. Explained she would be monitored for a total time of 120 minutes. Discharge vitals were taken at 5:06 PM 118/75, P 76, SpO2 98%. Verneita came to visit with patient once her thoughts were clearer to discuss how treatment went. Recommend she go home and sleep or just relax on the couch. No driving, no intense activities. Verbalized understanding.  Nurse was with pt a total of 60 minutes for clinical assessment. Pt will continue 84 mg for her treatments. Instructed to call with any issues prior to her next apts on Monday and Wednesday.   (84 mg)  LOT [id number] EXP MARCH 2028

## 2024-07-10 NOTE — Progress Notes (Unsigned)
 "     [facility]  Patient ID: Yolanda Wolfe,  MRN: [id number]  PCP: Jordan, Betty G, MD  Date of Evaluation: 07/10/2024 Time spent:65 minutes  Chief Complaint:  Chief Complaint   Depression; Other    HISTORY/CURRENT STATUS: HPI  For Spravato  treatment  Marsa continues to respond to the Spravato  and her other treatment.  She is able to enjoy things.  Energy and motivation are pretty good most of the time.  Work is going okay.  ADLs and personal hygiene are normal.  Appetite is normal.  Still gets anxious at times but the Xanax  is effective when needed.  She sleeps pretty well but is still having weird dreams.  Not nightmares like she has had in the past.  The prazosin  increase a month or so ago stopped the night terrors.  The weird dreams do stay with her but they are not terrifying.  No mania, delirium, or psychosis.  No suicidal or homicidal thoughts.  Individual Medical History/ Review of Systems: Changes? :No     Past medications for mental health diagnoses include: Amitriptylline for sleep? Celexa,  Zoloft , Cymbalta , Wellbutrin  caused migraines to worsen,  Lunesta , Ativan , Belsomra , Temazapam,  trazodone  not effective at low doses but at 100 mg she was too groggy, Mirtazapine  caused extreme wt gain, Sonata  ineffective, Ambien   Topamax  for migraines, somewhat effective  Allergies: Wellbutrin  [bupropion ]  Current Medications:  Current Outpatient Medications:    ALPRAZolam  (XANAX ) 1 MG tablet, TAKE 2 TABLETS (2 MG TOTAL) BY MOUTH AT BEDTIME, Disp: 60 tablet, Rfl: 5   Atogepant  (QULIPTA ) 60 MG TABS, Take 1 tablet (60 mg total) by mouth daily., Disp: 30 tablet, Rfl: 11   botulinum toxin Type A  (BOTOX ) 200 units injection, Inject 155 units IM into multiple site in the face,neck and head once every 90 days, Disp: 1 each, Rfl: 4   DOTTI 0.05 MG/24HR patch, APPLY 1 PATCH TWICE A WEEK TRANSDERMALLY FOR 28 DAYS, Disp: , Rfl:    DULoxetine  (CYMBALTA ) 60 MG capsule,  Take 2 capsules (120 mg total) by mouth daily., Disp: 180 capsule, Rfl: 1   Esketamine HCl, 84 MG Dose, (SPRAVATO , 84 MG DOSE,) 28 MG/DEVICE SOPK, Place 84 mg into the nose every 3 (three) days., Disp: , Rfl:    Eszopiclone  3 MG TABS, TAKE 1 TABLET (3 MG TOTAL) BY MOUTH AT BEDTIME AS NEEDED., Disp: 30 tablet, Rfl: 5   hydrOXYzine  (ATARAX ) 25 MG tablet, TAKE 1 TABLET FOR SLEEP AS NEEDED, Disp: 90 tablet, Rfl: 1   lurasidone  (LATUDA ) 80 MG TABS tablet, Take 1 tablet (80 mg total) by mouth daily with breakfast., Disp: 90 tablet, Rfl: 1   Methylfol-Algae-B12-Acetylcyst (CEREFOLIN NAC) 6-90.314-2-600 MG TABS, Take 1 tablet by mouth daily., Disp: 30 tablet, Rfl: 11   modafinil  (PROVIGIL ) 200 MG tablet, TAKE 0.5-1 TABLETS (100-200 MG TOTAL) BY MOUTH EVERY MORNING., Disp: 30 tablet, Rfl: 5   prazosin  (MINIPRESS ) 2 MG capsule, Take 2 capsules (4 mg total) by mouth at bedtime., Disp: 60 capsule, Rfl: 1   progesterone (PROMETRIUM) 100 MG capsule, Take 100 mg by mouth daily., Disp: , Rfl:    Rimegepant Sulfate (NURTEC) 75 MG TBDP, Take 1 tablet (75 mg total) by mouth daily as needed., Disp: 16 tablet, Rfl: 11   Tirzepatide (MOUNJARO [place]), Inject into the skin., Disp: , Rfl:    tretinoin (RETIN-A) 0.1 % cream, , Disp: , Rfl:  Medication Side Effects: H/A w/ Wellbutrin , Sexual dysfunction with Cymbalta    Family Medical/  Social History: Changes? no  MENTAL HEALTH EXAM:  There were no vitals taken for this visit.There is no height or weight on file to calculate BMI.  General Appearance: Casual and Well Groomed  Eye Contact:  Good  Speech:  Clear and Coherent and Normal Rate  Volume:  Normal  Mood:  Euthymic  Affect:  Congruent  Thought Process:  Goal Directed and Descriptions of Associations: Circumstantial  Orientation:  Full (Time, Place, and Person)  Thought Content: Logical   Suicidal Thoughts:  No  Homicidal Thoughts:  No  Memory:  WNL  Judgement:  Good  Insight:  Good  Psychomotor Activity:   Normal  Concentration:  Concentration: Good and Attention Span: Good  Recall:  Good  Fund of Knowledge: Good  Language: Good  Assets:  Communication Skills Desire for Improvement Financial Resources/Insurance Housing Physical Health Resilience Social Support Transportation Vocational/Educational  ADL's:  Intact  Cognition: WNL  Prognosis:  Good   Dr. Jordan is following labs.   GeneSight test results are in chart under Labs from 08/05/2020, had moderately reduced folic acid conversion  Patient was administered Spravato  84 mg intranasally today.  The patient experienced the typical dissociation which gradually resolved over the 2-hour period of observation.  There were no complications.  Specifically the patient did not have nausea or vomiting or headache.  Pulse ox and blood pressures remained within normal ranges at the 40-minute and 2-hour follow-up intervals.  By the time the 2-hour observation period was met the patient was alert and oriented and able to exit without assistance.  Patient feels the Spravato  administration is helpful for the treatment resistant depression and would like to continue the treatment.  See nursing note for further details.   ECT-MADRS    Flowsheet Row Clinical Support from 05/22/2024 in [facility] Office Visit from 02/27/2024 in [facility] Office Visit from 10/20/2023 in [facility] Office Visit from 08/31/2023 in [facility]  MADRS Total Score 19 34 17 38   GAD-7    Flowsheet Row Office Visit from 07/25/2022 in [facility] [place] [place] Office Visit from 07/31/2020 in [facility]  Total GAD-7 Score 5 13   PHQ2-9    Flowsheet Row Office Visit from 07/25/2022 in [facility] [place] [place] Office Visit from 03/30/2022 in [facility] [place] Video Visit from  02/25/2022 in [facility] [place] Office Visit from 07/31/2020 in [facility]  PHQ-2 Total Score 2 6 2 1   PHQ-9 Total Score 10 23 10 9    DIAGNOSES:    ICD-10-CM   1. Treatment-resistant depression  F32.9     2. Generalized anxiety disorder  F41.1     3. Abnormal dreams  F51.8     4. Insomnia due to other mental disorder  F51.05    F99       Receiving Psychotherapy: Yes with Marval Bunde, LCSW  RECOMMENDATIONS:  PDMP was reviewed.  Lunesta  filled 07/02/2024.  Modafinil  filled 06/26/2024.  Xanax  filled 06/26/2024. I provided approximately 65 minutes of face to face time during this encounter, including time spent before and after the visit in records review, medical decision making, counseling pertinent to today's visit, and charting.   We discussed the abnormal dreams.  They are much better than they were, however if the bizarre dreams become more disturbing she should call, I will increase prazosin  to  5 mg nightly.  She understands.  She is still doing well on the Spravato  twice weekly so no changes will be made.  Continue Xanax  1 mg, 2 p.o. nightly as needed. Continue Cymbalta   60 mg, 2 every day.  Continue Spravato  84 mg twice a week. Continue Cerafolin NAC, 1 daily. Continue Lunesta  3 mg, 1 p.o. nightly. Continue hydroxyzine  25 mg, 1 p.o. nightly as needed. Continue  Latuda   80 mg, 1 p.o. q. evening with food. Continue modafinil  200 mg, 1/2-1 p.o. every morning. Continue prazosin   4 mg at bedtime.  Continue therapy with Marval Bunde, LCSW. Return in 5 days.  Verneita Cooks, PA-C  "

## 2024-07-11 ENCOUNTER — Encounter: Payer: Self-pay | Admitting: Physician Assistant

## 2024-07-15 ENCOUNTER — Encounter

## 2024-07-15 ENCOUNTER — Encounter: Admitting: Physician Assistant

## 2024-07-17 ENCOUNTER — Encounter: Payer: Self-pay | Admitting: Physician Assistant

## 2024-07-17 ENCOUNTER — Ambulatory Visit: Admitting: Physician Assistant

## 2024-07-17 ENCOUNTER — Ambulatory Visit

## 2024-07-17 VITALS — BP 105/74 | HR 86

## 2024-07-17 DIAGNOSIS — F329 Major depressive disorder, single episode, unspecified: Secondary | ICD-10-CM

## 2024-07-17 DIAGNOSIS — F518 Other sleep disorders not due to a substance or known physiological condition: Secondary | ICD-10-CM

## 2024-07-17 DIAGNOSIS — F411 Generalized anxiety disorder: Secondary | ICD-10-CM

## 2024-07-17 NOTE — Progress Notes (Unsigned)
 "     [facility]  Patient ID: Yolanda Wolfe,  MRN: [id number]  PCP: Jordan, Betty G, MD  Date of Evaluation: 07/17/2024 Time spent:65 minutes  Chief Complaint:  Chief Complaint   Depression; Other    HISTORY/CURRENT STATUS: HPI  For Spravato  treatment  The spravato  still seems to be helping with depression. The past few days have been harder, the cold, dreary weather   Individual Medical History/ Review of Systems: Changes? :No     Past medications for mental health diagnoses include: Amitriptylline for sleep? Celexa,  Zoloft , Cymbalta , Wellbutrin  caused migraines to worsen,  Lunesta , Ativan , Belsomra , Temazapam,  trazodone  not effective at low doses but at 100 mg she was too groggy, Mirtazapine  caused extreme wt gain, Sonata  ineffective, Ambien   Topamax  for migraines, somewhat effective  Allergies: Wellbutrin  [bupropion ]  Current Medications:  Current Outpatient Medications:    ALPRAZolam  (XANAX ) 1 MG tablet, TAKE 2 TABLETS (2 MG TOTAL) BY MOUTH AT BEDTIME, Disp: 60 tablet, Rfl: 5   Atogepant  (QULIPTA ) 60 MG TABS, Take 1 tablet (60 mg total) by mouth daily., Disp: 30 tablet, Rfl: 11   botulinum toxin Type A  (BOTOX ) 200 units injection, Inject 155 units IM into multiple site in the face,neck and head once every 90 days, Disp: 1 each, Rfl: 4   DOTTI 0.05 MG/24HR patch, APPLY 1 PATCH TWICE A WEEK TRANSDERMALLY FOR 28 DAYS, Disp: , Rfl:    DULoxetine  (CYMBALTA ) 60 MG capsule, Take 2 capsules (120 mg total) by mouth daily., Disp: 180 capsule, Rfl: 1   Esketamine HCl, 84 MG Dose, (SPRAVATO , 84 MG DOSE,) 28 MG/DEVICE SOPK, Place 84 mg into the nose every 3 (three) days., Disp: , Rfl:    Eszopiclone  3 MG TABS, TAKE 1 TABLET (3 MG TOTAL) BY MOUTH AT BEDTIME AS NEEDED., Disp: 30 tablet, Rfl: 5   hydrOXYzine  (ATARAX ) 25 MG tablet, TAKE 1 TABLET FOR SLEEP AS NEEDED, Disp: 90 tablet, Rfl: 1   lurasidone  (LATUDA ) 80 MG TABS tablet, Take 1 tablet (80 mg total) by mouth  daily with breakfast., Disp: 90 tablet, Rfl: 1   Methylfol-Algae-B12-Acetylcyst (CEREFOLIN NAC) 6-90.314-2-600 MG TABS, Take 1 tablet by mouth daily., Disp: 30 tablet, Rfl: 11   modafinil  (PROVIGIL ) 200 MG tablet, TAKE 0.5-1 TABLETS (100-200 MG TOTAL) BY MOUTH EVERY MORNING., Disp: 30 tablet, Rfl: 5   prazosin  (MINIPRESS ) 2 MG capsule, Take 2 capsules (4 mg total) by mouth at bedtime., Disp: 60 capsule, Rfl: 1   progesterone (PROMETRIUM) 100 MG capsule, Take 100 mg by mouth daily., Disp: , Rfl:    Rimegepant Sulfate (NURTEC) 75 MG TBDP, Take 1 tablet (75 mg total) by mouth daily as needed., Disp: 16 tablet, Rfl: 11   Tirzepatide (MOUNJARO Ponce), Inject into the skin., Disp: , Rfl:    tretinoin (RETIN-A) 0.1 % cream, , Disp: , Rfl:  Medication Side Effects: H/A w/ Wellbutrin , Sexual dysfunction with Cymbalta    Family Medical/ Social History: Changes? no  MENTAL HEALTH EXAM:  There were no vitals taken for this visit.There is no height or weight on file to calculate BMI.  General Appearance: Casual and Well Groomed  Eye Contact:  Good  Speech:  Clear and Coherent and Normal Rate  Volume:  Normal  Mood:  Euthymic  Affect:  Congruent  Thought Process:  Goal Directed and Descriptions of Associations: Circumstantial  Orientation:  Full (Time, Place, and Person)  Thought Content: Logical   Suicidal Thoughts:  No  Homicidal Thoughts:  No  Memory:  WNL  Judgement:  Good  Insight:  Good  Psychomotor Activity:  Normal  Concentration:  Concentration: Good and Attention Span: Good  Recall:  Good  Fund of Knowledge: Good  Language: Good  Assets:  Communication Skills Desire for Improvement Financial Resources/Insurance Housing Physical Health Resilience Social Support Transportation Vocational/Educational  ADL's:  Intact  Cognition: WNL  Prognosis:  Good   Dr. Jordan is following labs.   GeneSight test results are in chart under Labs from 08/05/2020, had moderately reduced folic acid  conversion  Patient was administered Spravato  84 mg intranasally today.  The patient experienced the typical dissociation which gradually resolved over the 2-hour period of observation.  There were no complications.  Specifically the patient did not have nausea or vomiting or headache.  Pulse ox and blood pressures remained within normal ranges at the 40-minute and 2-hour follow-up intervals.  By the time the 2-hour observation period was met the patient was alert and oriented and able to exit without assistance.  Patient feels the Spravato  administration is helpful for the treatment resistant depression and would like to continue the treatment.  See nursing note for further details.   ECT-MADRS    Flowsheet Row Clinical Support from 05/22/2024 in [facility] Office Visit from 02/27/2024 in [facility] Office Visit from 10/20/2023 in [facility] Office Visit from 08/31/2023 in [facility]  MADRS Total Score 19 34 17 38   GAD-7    Flowsheet Row Office Visit from 07/25/2022 in [facility] [place] Office Visit from 07/31/2020 in [facility]  Total GAD-7 Score 5 13   PHQ2-9    Flowsheet Row Office Visit from 07/25/2022 in [facility] [place] Office Visit from 03/30/2022 in [facility] [place] Video Visit from 02/25/2022 in [facility] [place] [facility] [place] Office Visit from 07/31/2020 in [facility]  PHQ-2 Total Score 2 6 2 1   PHQ-9 Total Score 10 23 10 9    DIAGNOSES:  No diagnosis found.  Receiving Psychotherapy: Yes with Marval Bunde, LCSW  RECOMMENDATIONS:  PDMP was reviewed.  Lunesta  filled 07/02/2024.  Modafinil  filled 06/26/2024.  Xanax  fill 65 minutes of face to face time during this encounter, including time spent before and after the visit in  records review, medical decision making, counseling pertinent to today's visit, and charting.         Continue Xanax  1 mg, 2 p.o. nightly as needed. Continue Cymbalta   60 mg, 2 every day.  Continue Spravato  84 mg twice a week. Continue Cerafolin NAC, 1 daily. Continue Lunesta  3 mg, 1 p.o. nightly. Continue hydroxyzine  25 mg, 1 p.o. nightly as needed. Continue  Latuda   80 mg, 1 p.o. q. evening with food. Continue modafinil  200 mg, 1/2-1 p.o. every morning. Continue prazosin   4 mg at bedtime.  Continue therapy with Marval Bunde, LCSW. Return in 5 days.  Verneita Cooks, PA-C  "

## 2024-07-17 NOTE — Progress Notes (Unsigned)
 NURSES NOTE:         Pt arrived for  #24 Spravato  Treatment for treatment resistant depression, the starting dose was 56 mg (2 of the 28 mg nasal sprays), pt tolerated that dose well she will receive 84 mg today which will be her maintenance dose. Pt reports she did well after her first treatment and feels good to increase her dose today. Pt sees Verneita Cooks, [place] and she will follow up with her at each treatment. Explained to pt how the treatments would be scheduled and answered any questions she had today. She was given a practice nasal trainer to try a few times before given the one with medication. She verbalized understanding. Pt's Spravato  is billed through buy and bill medically.  Spravato  medication is stored at treatment center per REMS/FDA guidelines. The medication is required to be locked behind two doors per REMS/FDA protocol. Medication is also disposed of properly after each use per regulations. All documentation for REMS is completed and submitted per FDA/REMS requirements.          Began taking patient's vital signs at 2:15 PM 115/72, pulse 87. SpO2 Sat. 99%. Stable to proceed with treatment. Instructed patient to blow her nose if needed then recline back to a 45 degree angle. Gave patient first dose 28 mg nasal spray, administered in each nostril as directed and observed by nurse, waited 5 more minutes for the second and third dose. After all doses given pt did not complain of any nausea/vomiting, pt did have a drink to help with the taste of Spravato  it gives the metallic taste. Assessed her 40 minute vitals, 3:45 PM, 116/69, pulse 76, SpO2 99%. Explained she would be monitored for a total time of 120 minutes. Discharge vitals were taken at 5:01 PM 122/77, P 82, SpO2 98%. Verneita came to visit with patient once her thoughts were clearer to discuss how treatment went. Recommend she go home and sleep or just relax on the couch. No driving, no intense activities. Verbalized understanding.  Nurse was with pt a total of 60 minutes for clinical assessment. Pt will continue 84 mg for her treatments. Instructed to call with any issues prior to her next apts on Monday and Wednesday.    (84 mg)  LOT [id number] EXP MARCH 2028

## 2024-07-18 ENCOUNTER — Encounter: Payer: Self-pay | Admitting: Physician Assistant

## 2024-07-18 MED ORDER — PRAZOSIN HCL 2 MG PO CAPS: 6.0000 mg | ORAL_CAPSULE | Freq: Every day | ORAL | Status: AC

## 2024-07-23 ENCOUNTER — Other Ambulatory Visit (HOSPITAL_COMMUNITY): Payer: Self-pay

## 2024-07-23 ENCOUNTER — Ambulatory Visit: Admitting: Psychiatry

## 2024-07-23 DIAGNOSIS — F411 Generalized anxiety disorder: Secondary | ICD-10-CM

## 2024-07-24 ENCOUNTER — Ambulatory Visit

## 2024-07-24 ENCOUNTER — Ambulatory Visit: Admitting: Physician Assistant

## 2024-07-24 ENCOUNTER — Encounter: Payer: Self-pay | Admitting: Physician Assistant

## 2024-07-24 VITALS — BP 108/68 | HR 78

## 2024-07-24 DIAGNOSIS — F411 Generalized anxiety disorder: Secondary | ICD-10-CM

## 2024-07-24 DIAGNOSIS — F329 Major depressive disorder, single episode, unspecified: Secondary | ICD-10-CM

## 2024-07-24 DIAGNOSIS — E7212 Methylenetetrahydrofolate reductase deficiency: Secondary | ICD-10-CM

## 2024-07-24 DIAGNOSIS — F518 Other sleep disorders not due to a substance or known physiological condition: Secondary | ICD-10-CM

## 2024-07-24 NOTE — Progress Notes (Signed)
 NURSES NOTE:         Pt arrived for  #25 Spravato  Treatment for treatment resistant depression, the starting dose was 56 mg (2 of the 28 mg nasal sprays), pt tolerated that dose well she will receive 84 mg today which will be her maintenance dose. Pt reports she did well after her first treatment and feels good to increase her dose today. Pt sees Verneita Cooks, [place] and she will follow up with her at each treatment. Explained to pt how the treatments would be scheduled and answered any questions she had today. She was given a practice nasal trainer to try a few times before given the one with medication. She verbalized understanding. Pt's Spravato  is billed through buy and bill medically.  Spravato  medication is stored at treatment center per REMS/FDA guidelines. The medication is required to be locked behind two doors per REMS/FDA protocol. Medication is also disposed of properly after each use per regulations. All documentation for REMS is completed and submitted per FDA/REMS requirements.          Began taking patient's vital signs at 3:12 PM 110/72, pulse 82. SpO2 Sat. 98%. Stable to proceed with treatment. Instructed patient to blow her nose if needed then recline back to a 45 degree angle. Gave patient first dose 28 mg nasal spray, administered in each nostril as directed and observed by nurse, waited 5 more minutes for the second and third dose. After all doses given pt did not complain of any nausea/vomiting, pt did have a drink to help with the taste of Spravato  it gives the metallic taste. Assessed her 40 minute vitals, 4:00 PM, 114/70, pulse 74, SpO2 98%. Explained she would be monitored for a total time of 120 minutes. Discharge vitals were taken at 5:04 PM 108/68, P 78, SpO2 98%. Verneita came to visit with patient once her thoughts were clearer to discuss how treatment went. Recommend she go home and sleep or just relax on the couch. No driving, no intense activities. Verbalized understanding.  Nurse was with pt a total of 60 minutes for clinical assessment. Pt will continue 84 mg for her treatments. Instructed to call with any issues prior to her next apts on Monday and Wednesday.    (84 mg)  LOT [id number] EXP MARCH 2028

## 2024-07-24 NOTE — Progress Notes (Signed)
 "     [facility]  Patient ID: Yolanda Wolfe,  MRN: [id number]  PCP: Jordan, Betty G, MD  Date of Evaluation: 07/24/2024 Time spent:60 minutes  Chief Complaint:  Chief Complaint   Depression; Other    HISTORY/CURRENT STATUS: HPI  For Spravato  treatment  She is still doing well on the Spravato .  We have had 2 snows in the past 2 weeks, the most recent one was approximately 10 inches.  That has affected her seasonal affective disorder a little bit.  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  She feels that the Spravato  is still effective.  She is able to enjoy things.  Energy and motivation are good.  Appetite is normal and weight is stable.  ADLs and personal hygiene are normal.  She is sleeping fairly well.  We increased the prazosin  at the last visit.  She is not having nightmares, but still very bizarre, vivid dreams.  No mania, delirium, psychosis, suicidal or homicidal thoughts.  Individual Medical History/ Review of Systems: Changes? :No     Past medications for mental health diagnoses include: Amitriptylline for sleep? Celexa,  Zoloft , Cymbalta , Wellbutrin  caused migraines to worsen,  Lunesta , Ativan , Belsomra , Temazapam,  trazodone  not effective at low doses but at 100 mg she was too groggy, Mirtazapine  caused extreme wt gain, Sonata  ineffective, Ambien   Topamax  for migraines, somewhat effective  Allergies: Wellbutrin  [bupropion ]  Current Medications:  Current Outpatient Medications:    ALPRAZolam  (XANAX ) 1 MG tablet, TAKE 2 TABLETS (2 MG TOTAL) BY MOUTH AT BEDTIME, Disp: 60 tablet, Rfl: 5   Atogepant  (QULIPTA ) 60 MG TABS, Take 1 tablet (60 mg total) by mouth daily., Disp: 30 tablet, Rfl: 11   botulinum toxin Type A  (BOTOX ) 200 units injection, Inject 155 units IM into multiple site in the face,neck and head once every 90 days, Disp: 1 each, Rfl: 4   DOTTI 0.05 MG/24HR patch, APPLY 1 PATCH TWICE A WEEK TRANSDERMALLY  FOR 28 DAYS, Disp: , Rfl:    DULoxetine  (CYMBALTA ) 60 MG capsule, Take 2 capsules (120 mg total) by mouth daily., Disp: 180 capsule, Rfl: 1   Esketamine HCl, 84 MG Dose, (SPRAVATO , 84 MG DOSE,) 28 MG/DEVICE SOPK, Place 84 mg into the nose every 3 (three) days., Disp: , Rfl:    Eszopiclone  3 MG TABS, TAKE 1 TABLET (3 MG TOTAL) BY MOUTH AT BEDTIME AS NEEDED., Disp: 30 tablet, Rfl: 5   hydrOXYzine  (ATARAX ) 25 MG tablet, TAKE 1 TABLET FOR SLEEP AS NEEDED, Disp: 90 tablet, Rfl: 1   lurasidone  (LATUDA ) 80 MG TABS tablet, Take 1 tablet (80 mg total) by mouth daily with breakfast., Disp: 90 tablet, Rfl: 1   Methylfol-Algae-B12-Acetylcyst (CEREFOLIN NAC) 6-90.314-2-600 MG TABS, Take 1 tablet by mouth daily., Disp: 30 tablet, Rfl: 11   modafinil  (PROVIGIL ) 200 MG tablet, TAKE 0.5-1 TABLETS (100-200 MG TOTAL) BY MOUTH EVERY MORNING., Disp: 30 tablet, Rfl: 5   prazosin  (MINIPRESS ) 2 MG capsule, Take 3 capsules (6 mg total) by mouth at bedtime., Disp: , Rfl:    progesterone (PROMETRIUM) 100 MG capsule, Take 100 mg by mouth daily., Disp: , Rfl:    Rimegepant Sulfate (NURTEC) 75 MG TBDP, Take 1 tablet (75 mg total) by mouth daily as needed., Disp: 16 tablet, Rfl: 11   Tirzepatide (MOUNJARO Lee), Inject into the skin., Disp: , Rfl:    tretinoin (RETIN-A) 0.1 % cream, , Disp: , Rfl:  Medication Side Effects:  H/A w/ Wellbutrin , Sexual dysfunction with Cymbalta    Family Medical/ Social History: Changes? no  MENTAL HEALTH EXAM:  There were no vitals taken for this visit.There is no height or weight on file to calculate BMI.  General Appearance: Casual and Well Groomed  Eye Contact:  Good  Speech:  Clear and Coherent and Normal Rate  Volume:  Normal  Mood:  Euthymic  Affect:  Congruent  Thought Process:  Goal Directed and Descriptions of Associations: Circumstantial  Orientation:  Full (Time, Place, and Person)  Thought Content: Logical   Suicidal Thoughts:  No  Homicidal Thoughts:  No  Memory:  WNL   Judgement:  Good  Insight:  Good  Psychomotor Activity:  Normal  Concentration:  Concentration: Good and Attention Span: Good  Recall:  Good  Fund of Knowledge: Good  Language: Good  Assets:  Communication Skills Desire for Improvement Financial Resources/Insurance Housing Leisure Time Resilience Social Support Transportation Vocational/Educational  ADL's:  Intact  Cognition: WNL  Prognosis:  Good   Dr. Jordan is following labs.   GeneSight test results are in chart under Labs from 08/05/2020, had moderately reduced folic acid conversion  Patient was administered Spravato  84 mg intranasally today.  The patient experienced the typical dissociation which gradually resolved over the 2-hour period of observation.  There were no complications.  Specifically the patient did not have nausea or vomiting or headache.  Pulse ox and blood pressures remained within normal ranges at the 40-minute and 2-hour follow-up intervals.  By the time the 2-hour observation period was met the patient was alert and oriented and able to exit without assistance.  Patient feels the Spravato  administration is helpful for the treatment resistant depression and would like to continue the treatment.  See nursing note for further details.   ECT-MADRS    Flowsheet Row Clinical Support from 05/22/2024 in [facility] Office Visit from 02/27/2024 in [facility] Office Visit from 10/20/2023 in [facility] Office Visit from 08/31/2023 in [facility]  MADRS Total Score 19 34 17 38   GAD-7    Flowsheet Row Office Visit from 07/25/2022 in [facility] [place] [facility] [place] Office Visit from 07/31/2020 in [facility]  Total GAD-7 Score 5 13   PHQ2-9    Flowsheet Row Office Visit from 07/25/2022 in [facility] [place] [facility] [place] Office Visit from 03/30/2022 in [facility] [place] [facility] [place] Video Visit from 02/25/2022 in [facility] [place] [facility] [place] Office Visit from 07/31/2020 in [facility]  PHQ-2 Total Score 2 6 2 1   PHQ-9 Total Score 10 23 10 9    DIAGNOSES:    ICD-10-CM   1. Treatment-resistant depression  F32.9     2. Generalized anxiety disorder  F41.1     3. 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  Lunesta  filled 07/02/2024.  Modafinil  filled 06/26/2024.  Xanax  filled 06/26/2024. I provided approximately  60 minutes of face to face time during this encounter, including time spent before and after the visit in records review, medical decision making, counseling pertinent to today's visit, and charting.   She is doing well for the most part as far as the depression goes.  She is experiencing mild SAD but we both  feel that will improve once the weather improves.  Discussed the bizarre dreams.  We agreed to give the prazosin  a little more time to take effect.  If she is not much better at all when I see her next week for Spravato  I will consider changing to doxazosin.  Another option is Terazosin.  Continue Xanax  1 mg, 2 p.o. nightly as needed. Continue Cymbalta   60 mg, 2 every day.  Continue Spravato  84 mg twice a week. Continue Cerafolin NAC, 1 daily. Continue Lunesta  3 mg, 1 p.o. nightly. Continue hydroxyzine  25 mg, 1 p.o. nightly as needed. Continue  Latuda   80 mg, 1 p.o. q. evening with food. Continue modafinil  200 mg, 1/2-1 p.o. every morning. Continue prazosin   6 mg at bedtime.  Continue therapy with Marval Bunde, LCSW. Return in 5 days.  Verneita Cooks, PA-C  "

## 2024-07-25 ENCOUNTER — Other Ambulatory Visit: Payer: Self-pay

## 2024-08-16 ENCOUNTER — Ambulatory Visit: Admitting: Neurology

## 2024-08-20 ENCOUNTER — Ambulatory Visit: Admitting: Psychiatry

## 2024-09-02 ENCOUNTER — Encounter
